# Patient Record
Sex: Male | Born: 1955
Health system: Southern US, Community
[De-identification: ages and names within clinical notes are randomized; demographics above are authoritative.]

## PROBLEM LIST (undated history)

## (undated) DIAGNOSIS — F419 Anxiety disorder, unspecified: Secondary | ICD-10-CM

## (undated) DIAGNOSIS — I10 Essential (primary) hypertension: Secondary | ICD-10-CM

## (undated) DIAGNOSIS — G4730 Sleep apnea, unspecified: Secondary | ICD-10-CM

## (undated) DIAGNOSIS — J40 Bronchitis, not specified as acute or chronic: Secondary | ICD-10-CM

## (undated) DIAGNOSIS — I619 Nontraumatic intracerebral hemorrhage, unspecified: Secondary | ICD-10-CM

## (undated) DIAGNOSIS — G459 Transient cerebral ischemic attack, unspecified: Secondary | ICD-10-CM

## (undated) DIAGNOSIS — M170 Bilateral primary osteoarthritis of knee: Secondary | ICD-10-CM

## (undated) DIAGNOSIS — F329 Major depressive disorder, single episode, unspecified: Secondary | ICD-10-CM

## (undated) DIAGNOSIS — R7303 Prediabetes: Secondary | ICD-10-CM

## (undated) DIAGNOSIS — E119 Type 2 diabetes mellitus without complications: Secondary | ICD-10-CM

## (undated) DIAGNOSIS — F32A Depression, unspecified: Secondary | ICD-10-CM

## (undated) DIAGNOSIS — Z87442 Personal history of urinary calculi: Secondary | ICD-10-CM

## (undated) HISTORY — PX: CHOLECYSTECTOMY: SHX55

## (undated) HISTORY — PX: OTHER SURGICAL HISTORY: SHX169

## (undated) HISTORY — PX: TONSILLECTOMY: SUR1361

## (undated) HISTORY — PX: BARIATRIC SURGERY: SHX1103

---

## 1992-10-02 HISTORY — PX: ACHILLES TENDON SURGERY: SHX542

## 2001-07-24 ENCOUNTER — Encounter: Payer: Self-pay | Admitting: Family Medicine

## 2001-07-24 ENCOUNTER — Ambulatory Visit (HOSPITAL_COMMUNITY): Admission: RE | Admit: 2001-07-24 | Discharge: 2001-07-24 | Payer: Self-pay | Admitting: Family Medicine

## 2001-08-05 ENCOUNTER — Ambulatory Visit (HOSPITAL_COMMUNITY): Admission: RE | Admit: 2001-08-05 | Discharge: 2001-08-05 | Payer: Self-pay | Admitting: General Surgery

## 2001-12-02 ENCOUNTER — Emergency Department (HOSPITAL_COMMUNITY): Admission: EM | Admit: 2001-12-02 | Discharge: 2001-12-02 | Payer: Self-pay | Admitting: Emergency Medicine

## 2003-09-02 ENCOUNTER — Emergency Department (HOSPITAL_COMMUNITY): Admission: EM | Admit: 2003-09-02 | Discharge: 2003-09-02 | Payer: Self-pay | Admitting: *Deleted

## 2004-02-05 ENCOUNTER — Emergency Department (HOSPITAL_COMMUNITY): Admission: EM | Admit: 2004-02-05 | Discharge: 2004-02-05 | Payer: Self-pay | Admitting: Family Medicine

## 2006-10-02 HISTORY — PX: OTHER SURGICAL HISTORY: SHX169

## 2009-10-24 ENCOUNTER — Observation Stay (HOSPITAL_COMMUNITY): Admission: EM | Admit: 2009-10-24 | Discharge: 2009-10-25 | Payer: Self-pay | Admitting: Emergency Medicine

## 2010-12-18 LAB — BASIC METABOLIC PANEL
BUN: 20 mg/dL (ref 6–23)
Chloride: 98 mEq/L (ref 96–112)
Creatinine, Ser: 1 mg/dL (ref 0.4–1.5)
Glucose, Bld: 119 mg/dL — ABNORMAL HIGH (ref 70–99)
Potassium: 3.2 mEq/L — ABNORMAL LOW (ref 3.5–5.1)

## 2010-12-18 LAB — CBC
HCT: 40.7 % (ref 39.0–52.0)
HCT: 46.8 % (ref 39.0–52.0)
MCV: 86.4 fL (ref 78.0–100.0)
Platelets: 232 10*3/uL (ref 150–400)
Platelets: 268 10*3/uL (ref 150–400)
RBC: 4.66 MIL/uL (ref 4.22–5.81)
RDW: 13.1 % (ref 11.5–15.5)
WBC: 13.1 10*3/uL — ABNORMAL HIGH (ref 4.0–10.5)
WBC: 22 10*3/uL — ABNORMAL HIGH (ref 4.0–10.5)

## 2010-12-18 LAB — DIFFERENTIAL
Basophils Absolute: 0 10*3/uL (ref 0.0–0.1)
Basophils Absolute: 0 10*3/uL (ref 0.0–0.1)
Basophils Relative: 0 % (ref 0–1)
Eosinophils Absolute: 0 10*3/uL (ref 0.0–0.7)
Eosinophils Relative: 0 % (ref 0–5)
Lymphs Abs: 0.4 10*3/uL — ABNORMAL LOW (ref 0.7–4.0)
Monocytes Absolute: 0.8 10*3/uL (ref 0.1–1.0)
Monocytes Absolute: 0.9 10*3/uL (ref 0.1–1.0)
Neutro Abs: 11 10*3/uL — ABNORMAL HIGH (ref 1.7–7.7)
Neutrophils Relative %: 94 % — ABNORMAL HIGH (ref 43–77)

## 2010-12-18 LAB — COMPREHENSIVE METABOLIC PANEL
AST: 25 U/L (ref 0–37)
Albumin: 3 g/dL — ABNORMAL LOW (ref 3.5–5.2)
Alkaline Phosphatase: 63 U/L (ref 39–117)
BUN: 10 mg/dL (ref 6–23)
CO2: 21 mEq/L (ref 19–32)
Chloride: 106 mEq/L (ref 96–112)
GFR calc non Af Amer: >60 mL/min (ref >60)
Potassium: 3.8 mEq/L (ref 3.5–5.1)
Total Bilirubin: 1 mg/dL (ref 0.3–1.2)

## 2010-12-18 LAB — LACTIC ACID, PLASMA: Lactic Acid, Venous: 1.2 mmol/L (ref 0.5–2.2)

## 2010-12-18 LAB — CK: Total CK: 120 U/L (ref 7–232)

## 2010-12-18 LAB — URINE MICROSCOPIC-ADD ON

## 2010-12-18 LAB — HEPATIC FUNCTION PANEL
AST: 33 U/L (ref 0–37)
Albumin: 4 g/dL (ref 3.5–5.2)
Total Bilirubin: 1 mg/dL (ref 0.3–1.2)
Total Protein: 7.4 g/dL (ref 6.0–8.3)

## 2010-12-18 LAB — URINALYSIS, ROUTINE W REFLEX MICROSCOPIC
Bilirubin Urine: NEGATIVE
Glucose, UA: NEGATIVE mg/dL
Ketones, ur: NEGATIVE mg/dL
Leukocytes, UA: NEGATIVE
Nitrite: NEGATIVE
Protein, ur: NEGATIVE mg/dL
Specific Gravity, Urine: 1.015 (ref 1.005–1.030)
Urobilinogen, UA: 0.2 mg/dL (ref 0.0–1.0)
pH: 6 (ref 5.0–8.0)

## 2010-12-18 LAB — CULTURE, BLOOD (ROUTINE X 2): Report Status: 1282011

## 2010-12-18 LAB — LIPASE, BLOOD: Lipase: 25 U/L (ref 11–59)

## 2011-02-17 NOTE — H&P (Signed)
[facility]  Patient:    Seth [name], Seth [name] Visit Number: [id number] MRN: [id number]          Service Type: OUT Location: RAD Attending Physician:  Kirk Ruths Dictated by:   Franky Macho, M.D. Admit Date:  07/24/2001 Discharge Date: 07/24/2001   CC:         Karleen Hampshire, M.D.   History and Physical  AGE:  55 years old.  CHIEF COMPLAINT:  Cholecystitis, cholelithiasis.  HISTORY OF PRESENT ILLNESS:  The patient is a 55 year old white male who was referred for evaluation and treatment of cholecystitis secondary to cholelithiasis. He has been having episodes of right upper quadrant abdominal pain radiating to the back, nausea, bloating, fatty food intolerance, and indigestion for many months. The symptoms seem to be getting worse. No fever, chills, or jaundice had been noted. There is no history of peptic ulcer disease.  PAST MEDICAL HISTORY:  1. Morbid obesity.  2. Hypertension.  3. Sinus difficulties.  PAST SURGICAL HISTORY:  Right Achilles tendon repair.  CURRENT MEDICATIONS:  1. Ziac.  2. Altace.  3. Allegra.  ALLERGIES:  No known drug allergies.  REVIEW OF SYSTEMS:  The patient denies drinking or smoking. Denies any other cardiopulmonary difficulties or bleeding disorders.  PHYSICAL EXAMINATION:  GENERAL:  The patients a morbidly obese white male in no acute distress.  VITAL SIGNS:  He weighs approximately 375 pounds. He is afebrile and vital signs are stable.  HEENT:  Reveals no scleral icterus.  LUNGS:  Clear to auscultation with equal breath sounds bilaterally.  HEART:  Reveals a regular rate and rhythm without S3, S4, or murmurs.  ABDOMEN:  Soft, nontender, nondistended. No hepatosplenomegaly, masses, or hernias are noted. Slight tenderness noted in the right upper quadrant to deep palpation. Ultrasound of the gallbladder reveals cholelithiasis with a normal common bile duct.  IMPRESSION:  Cholecystitis,  cholelithiasis.  PLAN:  The patient is scheduled for a laparoscopic cholecystectomy on August 05, 2001. The risks and benefits of the procedure including bleeding, infection, hepatobiliary injury, and the possibility of an open procedure were fully explained to the patient, who gave informed consent. Dictated by:   Franky Macho, M.D. Attending Physician:  Kirk Ruths DD:  08/01/01 TD:  08/01/01 Job: [id number]

## 2011-02-17 NOTE — Op Note (Signed)
[facility]  Patient:    Seth [name], Seth [name] Visit Number: [id number] MRN: [id number]          Service Type: DSU Location: DAY Attending Physician:  Dalia Heading Dictated by:   Franky Macho, M.D. Proc. Date: 08/05/01 Admit Date:  08/05/2001                             Operative Report  PATIENT AGE:  55 years old.  PREOPERATIVE DIAGNOSIS:  Cholecystitis, cholelithiasis.  POSTOPERATIVE DIAGNOSIS:  Cholecystitis, cholelithiasis.  OPERATION:  Laparoscopic cholecystectomy.  SURGEON:  Franky Macho, M.D.  ASSISTANT:  Arna Snipe, M.D.  ANESTHESIA:  General endotracheal anesthesia.  INDICATIONS:  The patient is a 55 year old morbidly obese white male who presents with cholecystitis secondary to cholelithiasis.  The risks and benefits of the procedure including bleeding, infection, hepatobiliary injury, and the possibility of an open procedure were fully explained to the patient who gave informed consent.  DESCRIPTION OF PROCEDURE:  The patient was placed in the supine position. After induction of general endotracheal anesthesia, the abdomen was prepped and draped using the usual sterile technique with Betadine.  A supraumbilical incision was made down to the fascia.  A Veress needle was introduced into the abdominal cavity, and confirmation of placement was done using the saline drop test.  The abdomen was then insufflated with 16 mmHg pressure.  An 11 mm trocar was introduced into the abdominal cavity under direct visualization without difficulty.  An additional 11 mm trocar was placed in the epigastric region.  An additional 5 mm trocar was then placed in the left paramedian region, right upper quadrant, and right flank regions. The liver was inspected and noted to be fatty in nature.  The gallbladder was retracted superiorly and laterally.  The dissection was begun around the infundibulum of the gallbladder.  The cystic duct was first  identified, its juncture to the infundibulum fully identified.  Endoclips were placed proximally and distally on the cystic duct, and the cystic duct was divided. This was likewise done on the cystic artery.   The gallbladder was then freed away from the gallbladder fossa using Bovie electrocautery.  The gallbladder was delivered through the epigastric trocar site using an Endocatch bag without difficulty.  The gallbladder fossa was inspected, and no abnormal bleeding or bile leakage was noted.  Surgicel was placed in the gallbladder fossa.  Subhepatic space as well as right hepatic gutter were irrigated with normal saline.  All fluid and air were then evacuated from the abdominal cavity prior to removing the trocars.  All wounds were irrigated with normal saline.  All wounds were injected with 0.5% Marcaine.  The supraumbilical fascia reapproximated using an 0 Vicryl interrupted suture.  All skin incisions were closed using staples.  Betadine ointment and dry sterile dressings were applied.  All tape and needle counts were correct at the end of the procedure.  The patient was extubated in the operating room and went back to the recovery room awake and in stable condition.  COMPLICATIONS:  None.  SPECIMEN:  Gallbladder with stone.  ESTIMATED BLOOD LOSS:  Minimal. Dictated by:   Franky Macho, M.D. Attending Physician:  Dalia Heading DD:  08/05/01 TD:  08/06/01 Job: [id number]

## 2012-07-16 DIAGNOSIS — I619 Nontraumatic intracerebral hemorrhage, unspecified: Secondary | ICD-10-CM | POA: Insufficient documentation

## 2012-07-17 DIAGNOSIS — F419 Anxiety disorder, unspecified: Secondary | ICD-10-CM | POA: Insufficient documentation

## 2012-07-17 DIAGNOSIS — I10 Essential (primary) hypertension: Secondary | ICD-10-CM | POA: Insufficient documentation

## 2013-10-02 HISTORY — PX: COLONOSCOPY: SHX174

## 2014-12-17 DIAGNOSIS — J309 Allergic rhinitis, unspecified: Secondary | ICD-10-CM | POA: Insufficient documentation

## 2015-10-13 DIAGNOSIS — Z298 Encounter for other specified prophylactic measures: Secondary | ICD-10-CM | POA: Insufficient documentation

## 2015-10-13 DIAGNOSIS — Z2989 Encounter for other specified prophylactic measures: Secondary | ICD-10-CM | POA: Insufficient documentation

## 2015-12-08 DIAGNOSIS — F1120 Opioid dependence, uncomplicated: Secondary | ICD-10-CM | POA: Insufficient documentation

## 2016-03-23 DIAGNOSIS — F4322 Adjustment disorder with anxiety: Secondary | ICD-10-CM | POA: Insufficient documentation

## 2017-04-19 DIAGNOSIS — G459 Transient cerebral ischemic attack, unspecified: Secondary | ICD-10-CM | POA: Insufficient documentation

## 2017-04-19 DIAGNOSIS — M5431 Sciatica, right side: Secondary | ICD-10-CM | POA: Insufficient documentation

## 2017-08-07 ENCOUNTER — Other Ambulatory Visit (HOSPITAL_COMMUNITY): Payer: Self-pay | Admitting: Internal Medicine

## 2017-08-07 DIAGNOSIS — R109 Unspecified abdominal pain: Secondary | ICD-10-CM

## 2017-08-08 ENCOUNTER — Ambulatory Visit (HOSPITAL_COMMUNITY)
Admission: RE | Admit: 2017-08-08 | Discharge: 2017-08-08 | Disposition: A | Discharge status: Home / Self Care | Payer: BC Managed Care – PPO | Source: Ambulatory Visit | Attending: Internal Medicine | Admitting: Internal Medicine

## 2017-08-08 DIAGNOSIS — K760 Fatty (change of) liver, not elsewhere classified: Secondary | ICD-10-CM | POA: Diagnosis not present

## 2017-08-08 DIAGNOSIS — N200 Calculus of kidney: Secondary | ICD-10-CM | POA: Diagnosis not present

## 2017-08-08 DIAGNOSIS — R109 Unspecified abdominal pain: Secondary | ICD-10-CM | POA: Diagnosis present

## 2017-08-08 DIAGNOSIS — I700 Atherosclerosis of aorta: Secondary | ICD-10-CM | POA: Diagnosis not present

## 2017-08-13 ENCOUNTER — Ambulatory Visit (HOSPITAL_COMMUNITY): Payer: Self-pay

## 2017-12-14 ENCOUNTER — Ambulatory Visit (INDEPENDENT_AMBULATORY_CARE_PROVIDER_SITE_OTHER): Payer: Self-pay

## 2017-12-14 ENCOUNTER — Ambulatory Visit (INDEPENDENT_AMBULATORY_CARE_PROVIDER_SITE_OTHER): Payer: BC Managed Care – PPO | Admitting: Orthopedic Surgery

## 2017-12-14 ENCOUNTER — Encounter (INDEPENDENT_AMBULATORY_CARE_PROVIDER_SITE_OTHER): Payer: Self-pay | Admitting: Orthopedic Surgery

## 2017-12-14 DIAGNOSIS — M1712 Unilateral primary osteoarthritis, left knee: Secondary | ICD-10-CM | POA: Diagnosis not present

## 2017-12-14 DIAGNOSIS — M25561 Pain in right knee: Secondary | ICD-10-CM

## 2017-12-14 DIAGNOSIS — M1711 Unilateral primary osteoarthritis, right knee: Secondary | ICD-10-CM | POA: Diagnosis not present

## 2017-12-14 DIAGNOSIS — M25562 Pain in left knee: Secondary | ICD-10-CM | POA: Diagnosis not present

## 2017-12-14 DIAGNOSIS — M79672 Pain in left foot: Secondary | ICD-10-CM | POA: Diagnosis not present

## 2017-12-14 DIAGNOSIS — M79671 Pain in right foot: Secondary | ICD-10-CM | POA: Diagnosis not present

## 2017-12-14 DIAGNOSIS — M722 Plantar fascial fibromatosis: Secondary | ICD-10-CM | POA: Diagnosis not present

## 2017-12-15 ENCOUNTER — Encounter (INDEPENDENT_AMBULATORY_CARE_PROVIDER_SITE_OTHER): Payer: Self-pay | Admitting: Orthopedic Surgery

## 2017-12-15 MED ORDER — METHYLPREDNISOLONE ACETATE 40 MG/ML IJ SUSP
13.3300 mg | INTRAMUSCULAR | Status: AC | PRN
  Administered 2017-12-15: 13.33 mg

## 2017-12-15 MED ORDER — BUPIVACAINE HCL 0.25 % IJ SOLN
4.0000 mL | INTRAMUSCULAR | Status: AC | PRN
  Administered 2017-12-15: 4 mL via INTRA_ARTICULAR

## 2017-12-15 MED ORDER — LIDOCAINE HCL 1 % IJ SOLN
5.0000 mL | INTRAMUSCULAR | Status: AC | PRN
Start: 2017-12-15 — End: 2017-12-15
  Administered 2017-12-15: 5 mL

## 2017-12-15 MED ORDER — BUPIVACAINE HCL 0.5 % IJ SOLN
1.0000 mL | INTRAMUSCULAR | Status: AC | PRN
  Administered 2017-12-15: 1 mL

## 2017-12-15 MED ORDER — LIDOCAINE HCL 1 % IJ SOLN
3.0000 mL | INTRAMUSCULAR | Status: AC | PRN
  Administered 2017-12-15: 3 mL

## 2017-12-15 MED ORDER — METHYLPREDNISOLONE ACETATE 40 MG/ML IJ SUSP
40.0000 mg | INTRAMUSCULAR | Status: AC | PRN
  Administered 2017-12-15: 40 mg via INTRA_ARTICULAR

## 2017-12-15 NOTE — Progress Notes (Signed)
Office Visit Note   Patient: Seth [name]           Date of Birth: 1956-03-28           MRN: [id number] Visit Date: 12/14/2017 Requested by: No referring provider defined for this encounter. PCP: Patient, No Pcp Per  Subjective: Chief Complaint  Patient presents with  . Left Knee - Pain  . Right Knee - Pain  . Left Foot - Pain    HPI: Seth [name] is a 62 year old patient with bilateral knee pain left worse than right.  Ambulates with a cane.  He is requesting an injection today.  He also reports left heel pain.  Reports increased pain after sitting and being at rest.  Requesting an injection today.  He has tried ice and stretching.  His knee pain is somewhat debilitating.  Has had previous treatment over 5 years ago for the knee pain.              ROS: All systems reviewed are negative as they relate to the chief complaint within the history of present illness.  Patient denies  fevers or chills.   Assessment & Plan: Visit Diagnoses:  1. Pain in both knees, unspecified chronicity   2. Pain of left heel   3. Unilateral primary osteoarthritis, left knee   4. Unilateral primary osteoarthritis, right knee   5. Plantar fasciitis of left foot     Plan: Impression is bilateral knee pain left worse than right.  Radiographically end stage arthritis is present.  He also has significant plantar fasciitis.  He has failed conservative measures.  Left knee and left heel injected today.  If that helps and we can come back and inject the right knee.  At some point in time he is going to need knee replacement.  I will see him back as needed  Follow-Up Instructions: Return if symptoms worsen or fail to improve.   Orders:  Orders Placed This Encounter  Procedures  . XR KNEE 3 VIEW LEFT  . XR KNEE 3 VIEW RIGHT  . XR Foot 2 Views Left   No orders of the defined types were placed in this encounter.     Procedures: Large Joint Inj: L knee on 12/15/2017 5:31 PM Indications: diagnostic  evaluation, joint swelling and pain Details: 18 G 1.5 in needle, superolateral approach  Arthrogram: No  Medications: 5 mL lidocaine 1 %; 40 mg methylPREDNISolone acetate 40 MG/ML; 4 mL bupivacaine 0.25 % Outcome: tolerated well, no immediate complications Procedure, treatment alternatives, risks and benefits explained, specific risks discussed. Consent was given by the patient. Immediately prior to procedure a time out was called to verify the correct patient, procedure, equipment, support staff and site/side marked as required. Patient was prepped and draped in the usual sterile fashion.   Foot Inj Date/Time: 12/15/2017 5:31 PM Performed by: Cammy Copaean, Gregory Scott, MD Authorized by: Cammy Copaean, Gregory Scott, MD   Consent Given by:  Patient Site marked: the procedure site was marked   Timeout: prior to procedure the correct patient, procedure, and site was verified   Indications:  Fasciitis and pain Condition: Plantar Fasciitis   Location: left plantar fascia muscle   Prep: patient was prepped and draped in usual sterile fashion   Needle Size:  22 G Medications:  3 mL lidocaine 1 %; 1 mL bupivacaine 0.5 %; 13.33 mg methylPREDNISolone acetate 40 MG/ML Patient Tolerance:  Patient tolerated the procedure well with no immediate complications   Ultrasound-guided on the plantar  fascia  Clinical Data: No additional findings.  Objective: Vital Signs: There were no vitals taken for this visit.  Physical Exam:   Constitutional: Patient appears well-developed HEENT:  Head: Normocephalic Eyes:EOM are normal Neck: Normal range of motion Cardiovascular: Normal rate Pulmonary/chest: Effort normal Neurologic: Patient is alert Skin: Skin is warm Psychiatric: Patient has normal mood and affect    Ortho Exam: Orthopedic exam demonstrates palpable pedal pulses.  Patient has good plantar flexion strength bilaterally.  Achilles tendon is palpable and intact on the left-hand side.  Pedal pulses  palpable.  Patient has symmetric tibiotalar subtalar transverse tarsal range of motion with only slight heel cord contracture on the left and right side.  Both knees have medial greater than lateral joint line tenderness with trace effusion.  Collateral and cruciate ligaments are stable.  No other masses lymph adenopathy or skin changes noted in bilateral knee region.  Specialty Comments:  No specialty comments available.  Imaging: No results found.   PMFS History: There are no active problems to display for this patient.  History reviewed. No pertinent past medical history.  History reviewed. No pertinent family history.  History reviewed. No pertinent surgical history. Social History   Occupational History  . Not on file  Tobacco Use  . Smoking status: Not on file  Substance and Sexual Activity  . Alcohol use: Not on file  . Drug use: Not on file  . Sexual activity: Not on file

## 2018-01-10 ENCOUNTER — Ambulatory Visit (INDEPENDENT_AMBULATORY_CARE_PROVIDER_SITE_OTHER): Payer: BC Managed Care – PPO | Admitting: Orthopedic Surgery

## 2018-01-10 ENCOUNTER — Encounter (INDEPENDENT_AMBULATORY_CARE_PROVIDER_SITE_OTHER): Payer: Self-pay | Admitting: Orthopedic Surgery

## 2018-01-10 DIAGNOSIS — M1712 Unilateral primary osteoarthritis, left knee: Secondary | ICD-10-CM

## 2018-01-10 DIAGNOSIS — M1711 Unilateral primary osteoarthritis, right knee: Secondary | ICD-10-CM | POA: Diagnosis not present

## 2018-01-13 ENCOUNTER — Encounter (INDEPENDENT_AMBULATORY_CARE_PROVIDER_SITE_OTHER): Payer: Self-pay | Admitting: Orthopedic Surgery

## 2018-01-13 NOTE — Progress Notes (Signed)
   Office Visit Note   Patient: Seth [name]           Date of Birth: 1956/10/01           MRN: [id number] Visit Date: 01/10/2018 Requested by: No referring provider defined for this encounter. PCP: Patient, No Pcp Per  Subjective: Chief Complaint  Patient presents with  . Left Knee - Pain  . Right Knee - Pain    HPI: Seth [name] is a patient with bilateral knee pain.  Had an injection in his heel and knee 12/14/2017.  The plantar fascia injection is doing well.  He has end-stage arthritis in both knees.  He has a children's Gala on May 3.  Difficult for him to exercise and walk.  He does have a thyroid condition.  He localizes pain to the entire knee.  Hard for him to step down when going downstairs.              ROS: All systems reviewed are negative as they relate to the chief complaint within the history of present illness.  Patient denies  fevers or chills.   Assessment & Plan: Visit Diagnoses:  1. Unilateral primary osteoarthritis, left knee   2. Unilateral primary osteoarthritis, right knee     Plan: Impression is bilateral knee arthritis.  Total knee replacement is likely in his future.  He will be close to the BMI recommended guidelines.  We will try Synvisc injections within the next 2 weeks to try to help him get through the children's Gala.  I will see him back at that time.  Follow-Up Instructions: Return in about 2 weeks (around 01/24/2018).   Orders:  No orders of the defined types were placed in this encounter.  No orders of the defined types were placed in this encounter.     Procedures: No procedures performed   Clinical Data: No additional findings.  Objective: Vital Signs: There were no vitals taken for this visit.  Physical Exam:   Constitutional: Patient appears well-developed HEENT:  Head: Normocephalic Eyes:EOM are normal Neck: Normal range of motion Cardiovascular: Normal rate Pulmonary/chest: Effort normal Neurologic: Patient is  alert Skin: Skin is warm Psychiatric: Patient has normal mood and affect    Ortho Exam: Orthopedic exam demonstrates full active and passive range of motion of the ankles.  In the knees there is no effusion but tenderness to palpation diffusely in both knees.  Collateral cruciate ligaments are stable.  No groin pain with internal/external rotation of the leg.  No other masses lymph adenopathy or skin changes noted in that knee region.  Specialty Comments:  No specialty comments available.  Imaging: No results found.   PMFS History: There are no active problems to display for this patient.  History reviewed. No pertinent past medical history.  History reviewed. No pertinent family history.  History reviewed. No pertinent surgical history. Social History   Occupational History  . Not on file  Tobacco Use  . Smoking status: Unknown If Ever Smoked  . Smokeless tobacco: Never Used  Substance and Sexual Activity  . Alcohol use: Not on file  . Drug use: Not on file  . Sexual activity: Not on file

## 2018-01-14 ENCOUNTER — Telehealth (INDEPENDENT_AMBULATORY_CARE_PROVIDER_SITE_OTHER): Payer: Self-pay

## 2018-01-14 NOTE — Telephone Encounter (Signed)
Submitted application online for SynviscOne injection, bilateral knee. 

## 2018-01-22 ENCOUNTER — Telehealth (INDEPENDENT_AMBULATORY_CARE_PROVIDER_SITE_OTHER): Payer: Self-pay

## 2018-01-22 NOTE — Telephone Encounter (Signed)
[facility] [phone].

## 2018-01-23 ENCOUNTER — Telehealth (INDEPENDENT_AMBULATORY_CARE_PROVIDER_SITE_OTHER): Payer: Self-pay

## 2018-01-23 ENCOUNTER — Ambulatory Visit (INDEPENDENT_AMBULATORY_CARE_PROVIDER_SITE_OTHER): Payer: BC Managed Care – PPO | Admitting: Orthopedic Surgery

## 2018-01-23 NOTE — Telephone Encounter (Signed)
Patient approved to have SynviscOne injection, bilateral knee. Reference# [id number] Valid 01/22/18-01/22/2019  Buy & Bill Covered at 100% after $80.00 co-pay Appt. 01/31/18

## 2018-01-23 NOTE — Telephone Encounter (Signed)
Talked with [employer], authorization still pending for SynviscOne, bilateral knee.   Reference# [id number]

## 2018-01-31 ENCOUNTER — Ambulatory Visit (INDEPENDENT_AMBULATORY_CARE_PROVIDER_SITE_OTHER): Payer: BC Managed Care – PPO | Admitting: Orthopedic Surgery

## 2018-01-31 ENCOUNTER — Encounter (INDEPENDENT_AMBULATORY_CARE_PROVIDER_SITE_OTHER): Payer: Self-pay | Admitting: Orthopedic Surgery

## 2018-01-31 DIAGNOSIS — M1711 Unilateral primary osteoarthritis, right knee: Secondary | ICD-10-CM

## 2018-01-31 DIAGNOSIS — M1712 Unilateral primary osteoarthritis, left knee: Secondary | ICD-10-CM

## 2018-01-31 MED ORDER — PREDNISONE 5 MG (21) PO TBPK: ORAL_TABLET | ORAL | 0 refills | Status: DC

## 2018-01-31 NOTE — Progress Notes (Signed)
t

## 2018-02-01 ENCOUNTER — Telehealth (INDEPENDENT_AMBULATORY_CARE_PROVIDER_SITE_OTHER): Payer: Self-pay | Admitting: Orthopedic Surgery

## 2018-02-01 MED ORDER — PREDNISONE 5 MG (21) PO TBPK: ORAL_TABLET | ORAL | 0 refills | Status: DC

## 2018-02-01 NOTE — Telephone Encounter (Signed)
[facility] [phone]   Pharmacy phone line was down yesterday didn't receive prescription   Prednisone

## 2018-02-01 NOTE — Telephone Encounter (Signed)
I have tried to call the patient earlier this morning to let him know that I had resubmitted his medication (see other message from earlier today) He did not answer.  I tried calling him again, once receiving this VM and he did not answer again just now when I tried calling him back. I left him very detailed message apologizing for whatever we had done to make him upset and that we had sent his rx in several times. LM for him to call me back to discuss.

## 2018-02-01 NOTE — Telephone Encounter (Signed)
Resubmitted to pharmacy. I tried calling patient to advise. No answer.

## 2018-02-01 NOTE — Telephone Encounter (Signed)
Patient called very upset that he did not get his Rx last night. Patient said he want his Rx in the future in his hand and not sent to any pharmacy. Patient said if that can not be done he will go to another doctor. Patient said he has a big function to go to tonight and he needed his medicine.  The number to contact patient is [phone]

## 2018-02-02 ENCOUNTER — Encounter (INDEPENDENT_AMBULATORY_CARE_PROVIDER_SITE_OTHER): Payer: Self-pay | Admitting: Orthopedic Surgery

## 2018-02-02 DIAGNOSIS — M1711 Unilateral primary osteoarthritis, right knee: Secondary | ICD-10-CM | POA: Diagnosis not present

## 2018-02-02 DIAGNOSIS — M1712 Unilateral primary osteoarthritis, left knee: Secondary | ICD-10-CM

## 2018-02-02 MED ORDER — HYLAN G-F 20 48 MG/6ML IX SOSY
48.0000 mg | PREFILLED_SYRINGE | INTRA_ARTICULAR | Status: AC | PRN
  Administered 2018-02-02: 48 mg via INTRA_ARTICULAR

## 2018-02-02 MED ORDER — LIDOCAINE HCL 1 % IJ SOLN
5.0000 mL | INTRAMUSCULAR | Status: AC | PRN
  Administered 2018-02-02: 5 mL

## 2018-02-02 NOTE — Progress Notes (Signed)
   Procedure Note  Patient: Seth [name]             Date of Birth: 10/28/1955           MRN: [id number]             Visit Date: 01/31/2018  Procedures: Visit Diagnoses: Unilateral primary osteoarthritis, left knee  Unilateral primary osteoarthritis, right knee  Large Joint Inj: bilateral knee on 02/02/2018 7:05 PM Indications: diagnostic evaluation, joint swelling and pain Details: 18 G 1.5 in needle, superolateral approach  Arthrogram: No  Medications (Right): 5 mL lidocaine 1 %; 48 mg Hylan 48 MG/6ML Medications (Left): 5 mL lidocaine 1 %; 48 mg Hylan 48 MG/6ML Outcome: tolerated well, no immediate complications Procedure, treatment alternatives, risks and benefits explained, specific risks discussed. Consent was given by the patient. Immediately prior to procedure a time out was called to verify the correct patient, procedure, equipment, support staff and site/side marked as required. Patient was prepped and draped in the usual sterile fashion.

## 2018-05-30 ENCOUNTER — Other Ambulatory Visit (HOSPITAL_COMMUNITY): Payer: Self-pay | Admitting: Nurse Practitioner

## 2018-05-30 ENCOUNTER — Ambulatory Visit (HOSPITAL_COMMUNITY)
Admission: RE | Admit: 2018-05-30 | Discharge: 2018-05-30 | Disposition: A | Discharge status: Home / Self Care | Payer: BC Managed Care – PPO | Source: Ambulatory Visit | Attending: Nurse Practitioner | Admitting: Nurse Practitioner

## 2018-05-30 DIAGNOSIS — M5440 Lumbago with sciatica, unspecified side: Secondary | ICD-10-CM | POA: Diagnosis not present

## 2018-05-30 DIAGNOSIS — N200 Calculus of kidney: Secondary | ICD-10-CM | POA: Diagnosis not present

## 2018-05-30 DIAGNOSIS — R1030 Lower abdominal pain, unspecified: Secondary | ICD-10-CM

## 2018-06-04 ENCOUNTER — Other Ambulatory Visit: Payer: Self-pay | Admitting: Urology

## 2018-06-11 ENCOUNTER — Encounter (HOSPITAL_COMMUNITY)
Admission: RE | Admit: 2018-06-11 | Discharge: 2018-06-11 | Disposition: A | Discharge status: Home / Self Care | Payer: BC Managed Care – PPO | Source: Ambulatory Visit | Attending: Urology | Admitting: Urology

## 2018-06-11 ENCOUNTER — Encounter (HOSPITAL_COMMUNITY): Payer: Self-pay | Admitting: *Deleted

## 2018-06-11 DIAGNOSIS — I440 Atrioventricular block, first degree: Secondary | ICD-10-CM | POA: Insufficient documentation

## 2018-06-11 DIAGNOSIS — Z01810 Encounter for preprocedural cardiovascular examination: Secondary | ICD-10-CM | POA: Diagnosis not present

## 2018-06-11 DIAGNOSIS — N201 Calculus of ureter: Secondary | ICD-10-CM | POA: Diagnosis not present

## 2018-06-11 DIAGNOSIS — I4510 Unspecified right bundle-branch block: Secondary | ICD-10-CM | POA: Insufficient documentation

## 2018-06-11 NOTE — Progress Notes (Signed)
Pt states plans to arrive today or tomorrow am in order to have EKG preformed for lithotripsy procedure scheduled for Thursday 06/13/2018

## 2018-06-12 NOTE — H&P (Signed)
CC: I have kidney stones.  HPI: Seth [name] is a 62 year-old male patient who was referred by Seth [name] who is here for renal calculi.  The problem is on the left side.   Seth [name] is a 62 yo WM who is sent in consultation by Dr. Wende [name] for a left UPJ stone found on a KUB on 8/29 done for left upper quadrant and back pain. The stone is measured 11.6m on one view and 134mon another view. His pain is about a 3 now. His pain began 3 weeks ago and has been intermittently severe without nausea. He had no gross hematuria. He has some frequency and urgency with increased intake. He has had 2-3 prior stones but has passed them all. The current stone was in a calyx on a CT in 5/18 and was about 34m68mHe has a history of bariatric surgery in 4/05 with [facility] 90[facility] down from his presurgical weight. He is under pain management for his knees with oxycodone 10/325 tid but he has upped it to 4 daily. He passed a small stone about 3 weeks ago.      CC: AUA Questions Scoring.  HPI:     AUA Symptom Score: More than 50% of the time he has the sensation of not emptying his bladder completely when finished urinating. More than 50% of the time he has to urinate again fewer than two hours after he has finished urinating. Less than 50% of the time he has to start and stop again several times when he urinates. More than 50% of the time he finds it difficult to postpone urination. More than 50% of the time he has a weak urinary stream. Less than 50% of the time he has to push or strain to begin urination. He has to get up to urinate 2 times from the time he goes to bed until the time he gets up in the morning.   Calculated AUA Symptom Score: 22    ALLERGIES: Latex sulfa    MEDICATIONS: Metoprolol Succinate  Amlodipine Besylate  Aspirin Ec 81 mg tablet, delayed release  Citalopram Hbr  Keppra  Melatonin 10 mg capsule  Metaxalone  Montelukast Sodium  Xanax      GU PSH: None   NON-GU PSH: Bariatric surgery Cholecystectomy (laparoscopic) Hand/finger Surgery    GU PMH: None   NON-GU PMH: Anxiety Arthritis Depression Diabetes Type 2 GERD Hypertension Sleep Apnea Stroke/TIA, Hemorrhagic    FAMILY HISTORY: Emphysema - Mother Lung Cancer - Father   SOCIAL HISTORY: Marital Status: Single Preferred Language: English; Race: White Current Smoking Status: Patient has never smoked.   Tobacco Use Assessment Completed: Used Tobacco in last 30 days? Drinks 1 drink per day. Types of alcohol consumed: Liquor.  Drinks 2 caffeinated drinks per day. Patient's occupation is/was Retired from [employer]  REVIEW OF SYSTEMS:    GU Review Male:   Patient reports frequent urination, get up at night to urinate, and leakage of urine. Patient denies hard to postpone urination, burning/ pain with urination, stream starts and stops, trouble starting your stream, have to strain to urinate , erection problems, and penile pain.  Gastrointestinal (Upper):   Patient reports indigestion/ heartburn. Patient denies nausea and vomiting.  Gastrointestinal (Lower):   Patient denies diarrhea and constipation.  Constitutional:   Patient reports fatigue. Patient denies fever, night sweats, and weight loss.  Skin:   Patient  denies skin rash/ lesion and itching.  Eyes:   Patient denies blurred vision and double vision.  Ears/ Nose/ Throat:   Patient denies sore throat and sinus problems.  Hematologic/Lymphatic:   Patient denies swollen glands and easy bruising.  Cardiovascular:   Patient reports leg swelling. Patient denies chest pains.  Respiratory:   Patient denies cough and shortness of breath.  Endocrine:   Patient denies excessive thirst.  Musculoskeletal:   needs bilateral knee replacement. Patient reports back pain and joint pain.   Neurological:   Patient denies headaches and dizziness.  Psychologic:   Patient denies depression and anxiety.    VITAL SIGNS:      06/04/2018 10:51 AM  Weight 323 lb / 146.51 kg  Height 71 in / 180.34 cm  BP 101/66 mmHg  Pulse 69 /min  Temperature 97.5 F / 36.3 C  BMI 45.0 kg/m   MULTI-SYSTEM PHYSICAL EXAMINATION:    Constitutional: Obese. No physical deformities. Normally developed. Good grooming.   Neck: Neck symmetrical, not swollen. Normal tracheal position.  Respiratory: No labored breathing, no use of accessory muscles. CTA  Cardiovascular: Normal temperature, RRR without murmur. He has bilateral ankle edema.   Lymphatic: No enlargement, no tenderness of supraclavicular, neck lymph nodes.  Skin: No paleness, no jaundice, no cyanosis. No lesion, no ulcer, no rash.  Neurologic / Psychiatric: Oriented to time, oriented to place, oriented to person. No depression, no anxiety, no agitation.  Gastrointestinal: Abdominal tenderness in LUQ. No mass, no rigidity, obese abdomen.   Musculoskeletal: Normal gait and station of head and neck.     PAST DATA REVIEWED:  Source Of History:  Patient  Urine Test Review:   Urinalysis  X-Ray Review: KUB: Reviewed Films. Reviewed Report. Discussed With Patient. 62m LUPJ stone. C.T. Stone Protocol: Reviewed Films. Reviewed Report. Discussed With Patient. 5/18    PROCEDURES:          Urinalysis Dipstick Dipstick Cont'd  Color: Yellow Bilirubin: Neg mg/dL  Appearance: Clear Ketones: Neg mg/dL  Specific Gravity: 1.025 Blood: Neg ery/uL  pH: 5.5 Protein: Trace mg/dL  Glucose: Neg mg/dL Urobilinogen: 0.2 mg/dL    Nitrites: Neg    Leukocyte Esterase: Neg leu/uL    ASSESSMENT:      ICD-10 Details  1 GU:   Renal calculus - N20.0 He has an 11.5-14mm LUPJ stone with pain. I discussed the options including URS and ESWL. He would like to get scheduled for ESWL. Unfortunately he had ASA last night which will preclude treatment this week. I will get him ASAP next week but if his pain progresses he will need a stent. I reviewed the risks of ESWL including  bleeding, infection, injury to the kidney or adjacent structures, failure to fragment the stone, need for ancillary procedures, thrombotic events, cardiac arrhythmias and sedation complications. He is on pain management with oxycodone and I will defer to his pain doctor for additional break through meds. He will need MAC for the ESWL because of the chronic narcotics. I reviewed the risks of ESWL including bleeding, infection, injury to the kidney or adjacent structures, inability to target the stone because of body habitus or poor visibility, failure to fragment the stone, need for ancillary procedures, thrombotic events, cardiac arrhythmias and sedation complications.    PLAN:           Schedule Return Visit/Planned Activity: ASAP - Schedule Surgery             Note: ESWL on 9/9 if at all possible.  Procedure: Approximately 1 Week - ESWL - 50590, left          Document Letter(s):  Created for Patient: Clinical Summary         Notes:   CC: Dr. Wende [name].        Next Appointment:      Next Appointment: 06/13/2018 12:15 PM    Appointment Type: Surgery     Location: [facility] [phone]    Provider: Irine Seal, M.D.    Reason for Visit: WL/OP LEFT ESWL

## 2018-06-13 ENCOUNTER — Encounter (HOSPITAL_COMMUNITY)
Admission: RE | Disposition: A | Discharge status: Home / Self Care | Payer: Self-pay | Source: Ambulatory Visit | Attending: Urology

## 2018-06-13 ENCOUNTER — Ambulatory Visit (HOSPITAL_COMMUNITY): Payer: BC Managed Care – PPO

## 2018-06-13 ENCOUNTER — Ambulatory Visit (HOSPITAL_COMMUNITY)
Admission: RE | Admit: 2018-06-13 | Discharge: 2018-06-13 | Disposition: A | Discharge status: Home / Self Care | Payer: BC Managed Care – PPO | Source: Ambulatory Visit | Attending: Urology | Admitting: Urology

## 2018-06-13 ENCOUNTER — Encounter (HOSPITAL_COMMUNITY): Payer: Self-pay | Admitting: General Practice

## 2018-06-13 ENCOUNTER — Ambulatory Visit (HOSPITAL_COMMUNITY): Payer: BC Managed Care – PPO | Admitting: Anesthesiology

## 2018-06-13 DIAGNOSIS — Z6841 Body Mass Index (BMI) 40.0 and over, adult: Secondary | ICD-10-CM | POA: Diagnosis not present

## 2018-06-13 DIAGNOSIS — Z8673 Personal history of transient ischemic attack (TIA), and cerebral infarction without residual deficits: Secondary | ICD-10-CM | POA: Insufficient documentation

## 2018-06-13 DIAGNOSIS — I10 Essential (primary) hypertension: Secondary | ICD-10-CM | POA: Insufficient documentation

## 2018-06-13 DIAGNOSIS — E119 Type 2 diabetes mellitus without complications: Secondary | ICD-10-CM | POA: Insufficient documentation

## 2018-06-13 DIAGNOSIS — F329 Major depressive disorder, single episode, unspecified: Secondary | ICD-10-CM | POA: Insufficient documentation

## 2018-06-13 DIAGNOSIS — N201 Calculus of ureter: Secondary | ICD-10-CM | POA: Insufficient documentation

## 2018-06-13 DIAGNOSIS — Z79899 Other long term (current) drug therapy: Secondary | ICD-10-CM | POA: Insufficient documentation

## 2018-06-13 DIAGNOSIS — Z91040 Latex allergy status: Secondary | ICD-10-CM | POA: Insufficient documentation

## 2018-06-13 DIAGNOSIS — N135 Crossing vessel and stricture of ureter without hydronephrosis: Secondary | ICD-10-CM

## 2018-06-13 DIAGNOSIS — Z882 Allergy status to sulfonamides status: Secondary | ICD-10-CM | POA: Insufficient documentation

## 2018-06-13 DIAGNOSIS — Z7982 Long term (current) use of aspirin: Secondary | ICD-10-CM | POA: Diagnosis not present

## 2018-06-13 DIAGNOSIS — Z9884 Bariatric surgery status: Secondary | ICD-10-CM | POA: Diagnosis not present

## 2018-06-13 DIAGNOSIS — G4730 Sleep apnea, unspecified: Secondary | ICD-10-CM | POA: Diagnosis not present

## 2018-06-13 DIAGNOSIS — Z79891 Long term (current) use of opiate analgesic: Secondary | ICD-10-CM | POA: Diagnosis not present

## 2018-06-13 HISTORY — DX: Major depressive disorder, single episode, unspecified: F32.9

## 2018-06-13 HISTORY — DX: Depression, unspecified: F32.A

## 2018-06-13 HISTORY — DX: Transient cerebral ischemic attack, unspecified: G45.9

## 2018-06-13 HISTORY — PX: EXTRACORPOREAL SHOCK WAVE LITHOTRIPSY: SHX1557

## 2018-06-13 HISTORY — DX: Bilateral primary osteoarthritis of knee: M17.0

## 2018-06-13 HISTORY — DX: Type 2 diabetes mellitus without complications: E11.9

## 2018-06-13 HISTORY — DX: Essential (primary) hypertension: I10

## 2018-06-13 HISTORY — DX: Personal history of urinary calculi: Z87.442

## 2018-06-13 HISTORY — DX: Anxiety disorder, unspecified: F41.9

## 2018-06-13 HISTORY — DX: Sleep apnea, unspecified: G47.30

## 2018-06-13 SURGERY — LITHOTRIPSY, ESWL
Anesthesia: Monitor Anesthesia Care | Laterality: Left

## 2018-06-13 MED ORDER — SODIUM CHLORIDE 0.9 % IV SOLN
INTRAVENOUS | Status: DC
  Administered 2018-06-13: 11:00:00 via INTRAVENOUS

## 2018-06-13 MED ORDER — SILODOSIN 8 MG PO CAPS: 8.0000 mg | ORAL_CAPSULE | Freq: Every day | ORAL | 1 refills | Status: DC

## 2018-06-13 MED ORDER — MIDAZOLAM HCL 5 MG/5ML IJ SOLN
INTRAMUSCULAR | Status: DC | PRN
  Administered 2018-06-13: 2 mg via INTRAVENOUS

## 2018-06-13 MED ORDER — CIPROFLOXACIN HCL 500 MG PO TABS
500.0000 mg | ORAL_TABLET | ORAL | Status: AC
  Administered 2018-06-13: 500 mg via ORAL
  Filled 2018-06-13: qty 1

## 2018-06-13 MED ORDER — DIAZEPAM 5 MG PO TABS: 10.0000 mg | ORAL_TABLET | ORAL | Status: DC

## 2018-06-13 MED ORDER — PROPOFOL 10 MG/ML IV BOLUS
INTRAVENOUS | Status: DC | PRN
  Administered 2018-06-13: 450 mg via INTRAVENOUS

## 2018-06-13 MED ORDER — MIDAZOLAM HCL 2 MG/2ML IJ SOLN
INTRAMUSCULAR | Status: AC
  Filled 2018-06-13: qty 2

## 2018-06-13 MED ORDER — FENTANYL CITRATE (PF) 100 MCG/2ML IJ SOLN
INTRAMUSCULAR | Status: AC
  Filled 2018-06-13: qty 4

## 2018-06-13 MED ORDER — FENTANYL CITRATE (PF) 100 MCG/2ML IJ SOLN
INTRAMUSCULAR | Status: DC | PRN
  Administered 2018-06-13: 100 ug via INTRAVENOUS

## 2018-06-13 MED ORDER — PROPOFOL 10 MG/ML IV BOLUS
INTRAVENOUS | Status: AC
  Filled 2018-06-13: qty 100

## 2018-06-13 MED ORDER — DIPHENHYDRAMINE HCL 25 MG PO CAPS: 25.0000 mg | ORAL_CAPSULE | ORAL | Status: DC

## 2018-06-13 NOTE — Interval H&P Note (Signed)
History and Physical Interval Note:  No change in stone location  06/13/2018 12:25 PM  Seth [name]  has presented today for surgery, with the diagnosis of LEFT URETEROPELVIC JUNCTION STONE  The various methods of treatment have been discussed with the patient and family. After consideration of risks, benefits and other options for treatment, the patient has consented to  Procedure(s): LEFT EXTRACORPOREAL SHOCK WAVE LITHOTRIPSY (ESWL) WITH MAC (Left) as a surgical intervention .  The patient's history has been reviewed, patient examined, no change in status, stable for surgery.  I have reviewed the patient's chart and labs.  Questions were answered to the patient's satisfaction.     Irine Seal

## 2018-06-13 NOTE — Op Note (Signed)
See scanned PSC note.  Staged procedure.

## 2018-06-13 NOTE — Anesthesia Preprocedure Evaluation (Signed)
Anesthesia Evaluation  Patient identified by MRN, date of birth, ID band Patient awake    Reviewed: Allergy & Precautions, NPO status , Patient's Chart, lab work & pertinent test results  Airway Mallampati: II  TM Distance: <3 FB Neck ROM: Full    Dental no notable dental hx.    Pulmonary neg pulmonary ROS,    Pulmonary exam normal breath sounds clear to auscultation       Cardiovascular hypertension, Normal cardiovascular exam Rhythm:Regular Rate:Normal     Neuro/Psych Chronic oxycodone for back pain negative psych ROS   GI/Hepatic negative GI ROS, Neg liver ROS,   Endo/Other  Morbid obesity  Renal/GU negative Renal ROS  negative genitourinary   Musculoskeletal negative musculoskeletal ROS (+)   Abdominal (+) + obese,   Peds negative pediatric ROS (+)  Hematology negative hematology ROS (+)   Anesthesia Other Findings   Reproductive/Obstetrics negative OB ROS                             Anesthesia Physical Anesthesia Plan  ASA: III  Anesthesia Plan: MAC   Post-op Pain Management:    Induction: Intravenous  PONV Risk Score and Plan: 0  Airway Management Planned: Simple Face Mask  Additional Equipment:   Intra-op Plan:   Post-operative Plan:   Informed Consent: I have reviewed the patients History and Physical, chart, labs and discussed the procedure including the risks, benefits and alternatives for the proposed anesthesia with the patient or authorized representative who has indicated his/her understanding and acceptance.   Dental advisory given  Plan Discussed with: CRNA and Surgeon  Anesthesia Plan Comments:         Anesthesia Quick Evaluation

## 2018-06-13 NOTE — Discharge Instructions (Addendum)
Lithotripsy, Care After °This sheet gives you information about how to care for yourself after your procedure. Your health care provider may also give you more specific instructions. If you have problems or questions, contact your health care provider. °What can I expect after the procedure? °After the procedure, it is common to have: °· Some blood in your urine. This should only last for a few days. °· Soreness in your back, sides, or upper abdomen for a few days. °· Blotches or bruises on your back where the pressure wave entered the skin. °· Pain, discomfort, or nausea when pieces (fragments) of the kidney stone move through the tube that carries urine from the kidney to the bladder (ureter). Stone fragments may pass soon after the procedure, but they may continue to pass for up to 4-8 weeks. °? If you have severe pain or nausea, contact your health care provider. This may be caused by a large stone that was not broken up, and this may mean that you need more treatment. °· Some pain or discomfort during urination. °· Some pain or discomfort in the lower abdomen or (in men) at the base of the penis. ° °Follow these instructions at home: °Medicines °· Take over-the-counter and prescription medicines only as told by your health care provider. °· If you were prescribed an antibiotic medicine, take it as told by your health care provider. Do not stop taking the antibiotic even if you start to feel better. °· Do not drive for 24 hours if you were given a medicine to help you relax (sedative). °· Do not drive or use heavy machinery while taking prescription pain medicine. °Eating and drinking °· Drink enough water and fluids to keep your urine clear or pale yellow. This helps any remaining pieces of the stone to pass. It can also help prevent new stones from forming. °· Eat plenty of fresh fruits and vegetables. °· Follow instructions from your health care provider about eating and drinking restrictions. You may be  instructed: °? To reduce how much salt (sodium) you eat or drink. Check ingredients and nutrition facts on packaged foods and beverages. °? To reduce how much meat you eat. °· Eat the recommended amount of calcium for your age and gender. Ask your health care provider how much calcium you should have. °General instructions °· Get plenty of rest. °· Most people can resume normal activities 1-2 days after the procedure. Ask your health care provider what activities are safe for you. °· If directed, strain all urine through the strainer that was provided by your health care provider. °? Keep all fragments for your health care provider to see. Any stones that are found may be sent to a medical lab for examination. The stone may be as small as a grain of salt. °· Keep all follow-up visits as told by your health care provider. This is important. °Contact a health care provider if: °· You have pain that is severe or does not get better with medicine. °· You have nausea that is severe or does not go away. °· You have blood in your urine longer than your health care provider told you to expect. °· You have more blood in your urine. °· You have pain during urination that does not go away. °· You urinate more frequently than usual and this does not go away. °· You develop a rash or any other possible signs of an allergic reaction. °Get help right away if: °· You have severe pain in   your back, sides, or upper abdomen.  You have severe pain while urinating.  Your urine is very dark red.  You have blood in your stool (feces).  You cannot pass any urine at all.  You feel a strong urge to urinate after emptying your bladder.  You have a fever or chills.  You develop shortness of breath, difficulty breathing, or chest pain.  You have severe nausea that leads to persistent vomiting.  You faint. Summary  After this procedure, it is common to have some pain, discomfort, or nausea when pieces (fragments) of the  kidney stone move through the tube that carries urine from the kidney to the bladder (ureter). If this pain or nausea is severe, however, you should contact your health care provider.  Most people can resume normal activities 1-2 days after the procedure. Ask your health care provider what activities are safe for you.  Drink enough water and fluids to keep your urine clear or pale yellow. This helps any remaining pieces of the stone to pass, and it can help prevent new stones from forming.  If directed, strain your urine and keep all fragments for your health care provider to see. Fragments or stones may be as small as a grain of salt.  Get help right away if you have severe pain in your back, sides, or upper abdomen or have severe pain while urinating.  I have given you a prescription for Silodosin 8mg  daily.   This is to help relax the ureter and aid stone passage.   Side effects include nasal stuffiness and occasionally some light headedness and dizziness with standing.  It can also cause issues with ejaculation if you are sexually active, but that is reversible.   This information is not intended to replace advice given to you by your health care provider. Make sure you discuss any questions you have with your health care provider. Document Released: 10/08/2007 Document Revised: 08/09/2016 Document Reviewed: 08/09/2016 Elsevier Interactive Patient Education  [employer]2018 [employer]

## 2018-06-13 NOTE — Transfer of Care (Signed)
Immediate Anesthesia Transfer of Care Note  Patient: Unk Lightning  Procedure(s) Performed: LEFT EXTRACORPOREAL SHOCK WAVE LITHOTRIPSY (ESWL) WITH MAC (Left )  Patient Location: PACU  Anesthesia Type:MAC  Level of Consciousness: alert  and oriented  Airway & Oxygen Therapy: Patient Spontanous Breathing  Post-op Assessment: Report given to RN and Post -op Vital signs reviewed and stable  Post vital signs: Reviewed and stable  Last Vitals:  Vitals Value Taken Time  BP    Temp    Pulse    Resp    SpO2      Last Pain:  Vitals:   06/13/18 1105  TempSrc:   PainSc: 2       Patients Stated Pain Goal: 2 (17/40/81 4481)  Complications: No apparent anesthesia complications

## 2018-06-24 ENCOUNTER — Encounter (HOSPITAL_COMMUNITY): Payer: Self-pay | Admitting: Urology

## 2019-02-18 DIAGNOSIS — Z8669 Personal history of other diseases of the nervous system and sense organs: Secondary | ICD-10-CM | POA: Diagnosis not present

## 2019-02-18 DIAGNOSIS — M545 Low back pain: Secondary | ICD-10-CM | POA: Diagnosis not present

## 2019-02-18 DIAGNOSIS — M5430 Sciatica, unspecified side: Secondary | ICD-10-CM | POA: Diagnosis not present

## 2019-02-18 DIAGNOSIS — M170 Bilateral primary osteoarthritis of knee: Secondary | ICD-10-CM | POA: Diagnosis not present

## 2019-02-18 DIAGNOSIS — F411 Generalized anxiety disorder: Secondary | ICD-10-CM | POA: Diagnosis not present

## 2019-02-18 DIAGNOSIS — I10 Essential (primary) hypertension: Secondary | ICD-10-CM | POA: Diagnosis not present

## 2019-02-27 DIAGNOSIS — M5430 Sciatica, unspecified side: Secondary | ICD-10-CM | POA: Diagnosis not present

## 2019-02-28 DIAGNOSIS — M9903 Segmental and somatic dysfunction of lumbar region: Secondary | ICD-10-CM | POA: Diagnosis not present

## 2019-02-28 DIAGNOSIS — M9901 Segmental and somatic dysfunction of cervical region: Secondary | ICD-10-CM | POA: Diagnosis not present

## 2019-02-28 DIAGNOSIS — M9902 Segmental and somatic dysfunction of thoracic region: Secondary | ICD-10-CM | POA: Diagnosis not present

## 2019-02-28 DIAGNOSIS — M5442 Lumbago with sciatica, left side: Secondary | ICD-10-CM | POA: Diagnosis not present

## 2019-02-28 DIAGNOSIS — M9905 Segmental and somatic dysfunction of pelvic region: Secondary | ICD-10-CM | POA: Diagnosis not present

## 2019-03-03 DIAGNOSIS — M5442 Lumbago with sciatica, left side: Secondary | ICD-10-CM | POA: Diagnosis not present

## 2019-03-03 DIAGNOSIS — M9903 Segmental and somatic dysfunction of lumbar region: Secondary | ICD-10-CM | POA: Diagnosis not present

## 2019-03-03 DIAGNOSIS — M9905 Segmental and somatic dysfunction of pelvic region: Secondary | ICD-10-CM | POA: Diagnosis not present

## 2019-03-03 DIAGNOSIS — M9901 Segmental and somatic dysfunction of cervical region: Secondary | ICD-10-CM | POA: Diagnosis not present

## 2019-03-03 DIAGNOSIS — M9902 Segmental and somatic dysfunction of thoracic region: Secondary | ICD-10-CM | POA: Diagnosis not present

## 2019-03-07 DIAGNOSIS — M9905 Segmental and somatic dysfunction of pelvic region: Secondary | ICD-10-CM | POA: Diagnosis not present

## 2019-03-07 DIAGNOSIS — M9901 Segmental and somatic dysfunction of cervical region: Secondary | ICD-10-CM | POA: Diagnosis not present

## 2019-03-07 DIAGNOSIS — M5442 Lumbago with sciatica, left side: Secondary | ICD-10-CM | POA: Diagnosis not present

## 2019-03-07 DIAGNOSIS — M9902 Segmental and somatic dysfunction of thoracic region: Secondary | ICD-10-CM | POA: Diagnosis not present

## 2019-03-07 DIAGNOSIS — M9903 Segmental and somatic dysfunction of lumbar region: Secondary | ICD-10-CM | POA: Diagnosis not present

## 2019-03-14 DIAGNOSIS — M9901 Segmental and somatic dysfunction of cervical region: Secondary | ICD-10-CM | POA: Diagnosis not present

## 2019-03-14 DIAGNOSIS — M9902 Segmental and somatic dysfunction of thoracic region: Secondary | ICD-10-CM | POA: Diagnosis not present

## 2019-03-14 DIAGNOSIS — M9905 Segmental and somatic dysfunction of pelvic region: Secondary | ICD-10-CM | POA: Diagnosis not present

## 2019-03-14 DIAGNOSIS — M5442 Lumbago with sciatica, left side: Secondary | ICD-10-CM | POA: Diagnosis not present

## 2019-03-14 DIAGNOSIS — M9903 Segmental and somatic dysfunction of lumbar region: Secondary | ICD-10-CM | POA: Diagnosis not present

## 2019-03-21 DIAGNOSIS — M9901 Segmental and somatic dysfunction of cervical region: Secondary | ICD-10-CM | POA: Diagnosis not present

## 2019-03-21 DIAGNOSIS — M9902 Segmental and somatic dysfunction of thoracic region: Secondary | ICD-10-CM | POA: Diagnosis not present

## 2019-03-21 DIAGNOSIS — M5442 Lumbago with sciatica, left side: Secondary | ICD-10-CM | POA: Diagnosis not present

## 2019-03-21 DIAGNOSIS — M9903 Segmental and somatic dysfunction of lumbar region: Secondary | ICD-10-CM | POA: Diagnosis not present

## 2019-03-21 DIAGNOSIS — M9905 Segmental and somatic dysfunction of pelvic region: Secondary | ICD-10-CM | POA: Diagnosis not present

## 2019-04-09 DIAGNOSIS — M5442 Lumbago with sciatica, left side: Secondary | ICD-10-CM | POA: Diagnosis not present

## 2019-04-09 DIAGNOSIS — M9903 Segmental and somatic dysfunction of lumbar region: Secondary | ICD-10-CM | POA: Diagnosis not present

## 2019-04-09 DIAGNOSIS — M9902 Segmental and somatic dysfunction of thoracic region: Secondary | ICD-10-CM | POA: Diagnosis not present

## 2019-04-09 DIAGNOSIS — M9901 Segmental and somatic dysfunction of cervical region: Secondary | ICD-10-CM | POA: Diagnosis not present

## 2019-04-09 DIAGNOSIS — M9905 Segmental and somatic dysfunction of pelvic region: Secondary | ICD-10-CM | POA: Diagnosis not present

## 2019-04-15 ENCOUNTER — Ambulatory Visit (INDEPENDENT_AMBULATORY_CARE_PROVIDER_SITE_OTHER): Payer: BC Managed Care – PPO

## 2019-04-15 ENCOUNTER — Ambulatory Visit (INDEPENDENT_AMBULATORY_CARE_PROVIDER_SITE_OTHER): Payer: BC Managed Care – PPO | Admitting: Orthopedic Surgery

## 2019-04-15 ENCOUNTER — Other Ambulatory Visit: Payer: Self-pay

## 2019-04-15 ENCOUNTER — Encounter: Payer: Self-pay | Admitting: Orthopedic Surgery

## 2019-04-15 DIAGNOSIS — M5442 Lumbago with sciatica, left side: Secondary | ICD-10-CM

## 2019-04-15 NOTE — Progress Notes (Signed)
Office Visit Note   Patient: Seth [name]           Date of Birth: [birth year]           MRN: [id number] Visit Date: 04/15/2019 Requested by: Celene Squibb, MD [place],  [place] [id number] PCP: Celene Squibb, MD  Subjective: Chief Complaint  Patient presents with  . Lower Back - Pain    HPI: Pt is a 63 y.o. Male who presents to the clinic complaining of low back pain.  Pt states pain has been ongoing for 3 months but worse for the last 3 weeks.  He has tried physical therapy exercises at home, chiropracter visits, Ibuprofen, and Oxycodone with only mild relief.  Pain begins in his axial lumbar spine and radiates to his L buttock.  Pain is worse with coughing.  The pain awakens him at night often.  Denies any bowel/bladder dysfunction or any saddle anesthesia.  Denies any weakness.                ROS: All systems reviewed are negative as they relate to the chief complaint within the history of present illness.  Patient denies  fevers or chills.   Assessment & Plan: Visit Diagnoses:  1. Acute bilateral low back pain with left-sided sciatica     Plan: Pt has been experiencing back pain with associated sciatica to the L buttock for 3 months now.  He has nerve root tension signs.  Patient has tried and failed at-home therapy exercises due to pain and his back pain is not controlled by anti-inflammatories or his Oxycodone.  Pt requests ESI's.  I offered MRI of lumbar spine with referral to Dr. Ernestina Patches for ESI's. Pt agreed with plan.  Pt will followup with Dr. Ernestina Patches.    Follow-Up Instructions: No follow-ups on file.   Orders:  Orders Placed This Encounter  Procedures  . XR Lumbar Spine 2-3 Views   No orders of the defined types were placed in this encounter.     Procedures: No procedures performed   Clinical Data: No additional findings.  Objective: Vital Signs: There were no vitals taken for this visit.  Physical Exam:   Constitutional: Patient appears  well-developed HEENT:  Head: Normocephalic Eyes:EOM are normal Neck: Normal range of motion Cardiovascular: Normal rate Pulmonary/chest: Effort normal Neurologic: Patient is alert Skin: Skin is warm Psychiatric: Patient has normal mood and affect    Ortho Exam:  Back exam Positive SLR on L Negative SLR on R Moderately TTP over the axial lumbar spine with less tenderness over the lumbar paraspinal muscles No evidence of hypo or hyperreflexia No significant hip flexor, quadricep, dorsiflexion/planatar flexion weakness on L when compared to R   Specialty Comments:  No specialty comments available.  Imaging: Xr Lumbar Spine 2-3 Views  Result Date: 04/15/2019 AP lateral lumbar spine reviewed.  Some calcification of the aorta is present.  No spondylolisthesis but significant facet degenerative changes noted in the lower lumbar spine.  Visualized hips appear intact.  No acute fracture.  No significant degenerative disc disease between the vertebral bodies.    PMFS History: There are no active problems to display for this patient.  Past Medical History:  Diagnosis Date  . Anxiety   . Depression   . Diabetes mellitus without complication (HCC)    history of, no medications now after Bariatric surgery.  Marland Kitchen History of kidney stones   . Hypertension   . Osteoarthritis of both knees   .  Sleep apnea    dx. at one time, then they said no- mild, no cpap  . Transient ischemic attack (TIA)     History reviewed. No pertinent family history.  Past Surgical History:  Procedure Laterality Date  . ACHILLES TENDON SURGERY Right 1994  . [place] SURGERY     2005  . cataracts Bilateral 2008  . CHOLECYSTECTOMY    . EXTRACORPOREAL SHOCK WAVE LITHOTRIPSY Left 06/13/2018   Procedure: LEFT EXTRACORPOREAL SHOCK WAVE LITHOTRIPSY (ESWL) WITH MAC;  Surgeon: Irine Seal, MD;  Location: [facility];  Service: Urology;  Laterality: Left;  . repair of wrist fx Right   . TONSILLECTOMY     Social  History   Occupational History  . Not on file  Tobacco Use  . Smoking status: Never Smoker  . Smokeless tobacco: Never Used  Substance and Sexual Activity  . Alcohol use: Not Currently    Frequency: Never  . Drug use: Never  . Sexual activity: Not on file

## 2019-04-17 ENCOUNTER — Telehealth: Payer: Self-pay | Admitting: Orthopedic Surgery

## 2019-04-17 NOTE — Telephone Encounter (Signed)
Someone PLEASE call this patient. He was having a meltdown on the phone about the review of MRI. He refuses to pay the $94.00 and wanted the MRI moved to Friday. Will images be available then?  Tonisha, I included you because he is adamant about seeing Ernestina Patches for the MRI  REVIEW and states he can never get through the main number. Whatever is decided will someone let me know. What is schedule is a day and time but NOT the MD due to the $94

## 2019-04-17 NOTE — Telephone Encounter (Signed)
Please see message below. I have pt scheduled for MRI and after speaking with Dr. Ernestina Patches he advised he will proceed with injection once MRI is done.

## 2019-04-17 NOTE — Telephone Encounter (Signed)
Noted. Thanks for taking care of this for patient.

## 2019-04-17 NOTE — Telephone Encounter (Signed)
Called Seth [name] to schedule MRI REVIEW with Seth [name]. Patient stated he was NOT GOING TO DO THAT!! He was not paying another $94.00 to see Seth [name] because he told Dr, Marlou [name] he could not afford this and Seth [name] is saying Seth [name] told him he could go to Seth [name] which he gave me a hard time when I had initially called him.   He wants to speak to Seth [name] about this before he commits to coming to this appointment.  Please call patient @ [phone]

## 2019-04-17 NOTE — Telephone Encounter (Signed)
Spoke with Dr. Ernestina Patches and he states injection and MRI Review can be scheduled same day. Advised pt and he is scheduled for 05/06/2019 with driver.

## 2019-04-18 DIAGNOSIS — M9902 Segmental and somatic dysfunction of thoracic region: Secondary | ICD-10-CM | POA: Diagnosis not present

## 2019-04-18 DIAGNOSIS — M9905 Segmental and somatic dysfunction of pelvic region: Secondary | ICD-10-CM | POA: Diagnosis not present

## 2019-04-18 DIAGNOSIS — M9903 Segmental and somatic dysfunction of lumbar region: Secondary | ICD-10-CM | POA: Diagnosis not present

## 2019-04-18 DIAGNOSIS — M5442 Lumbago with sciatica, left side: Secondary | ICD-10-CM | POA: Diagnosis not present

## 2019-04-18 DIAGNOSIS — M9901 Segmental and somatic dysfunction of cervical region: Secondary | ICD-10-CM | POA: Diagnosis not present

## 2019-04-19 IMAGING — CR DG ABDOMEN 1V
2 series · 2 of 2 positions shown · non-contrast
Comparison: CT abdomen and pelvis August 08, 2017. Lumbar spine
series May 30, 2018

CLINICAL DATA: Ureteral calculus

EXAM:
ABDOMEN - 1 VIEW

[t abdomen supine * (1 of 2)]
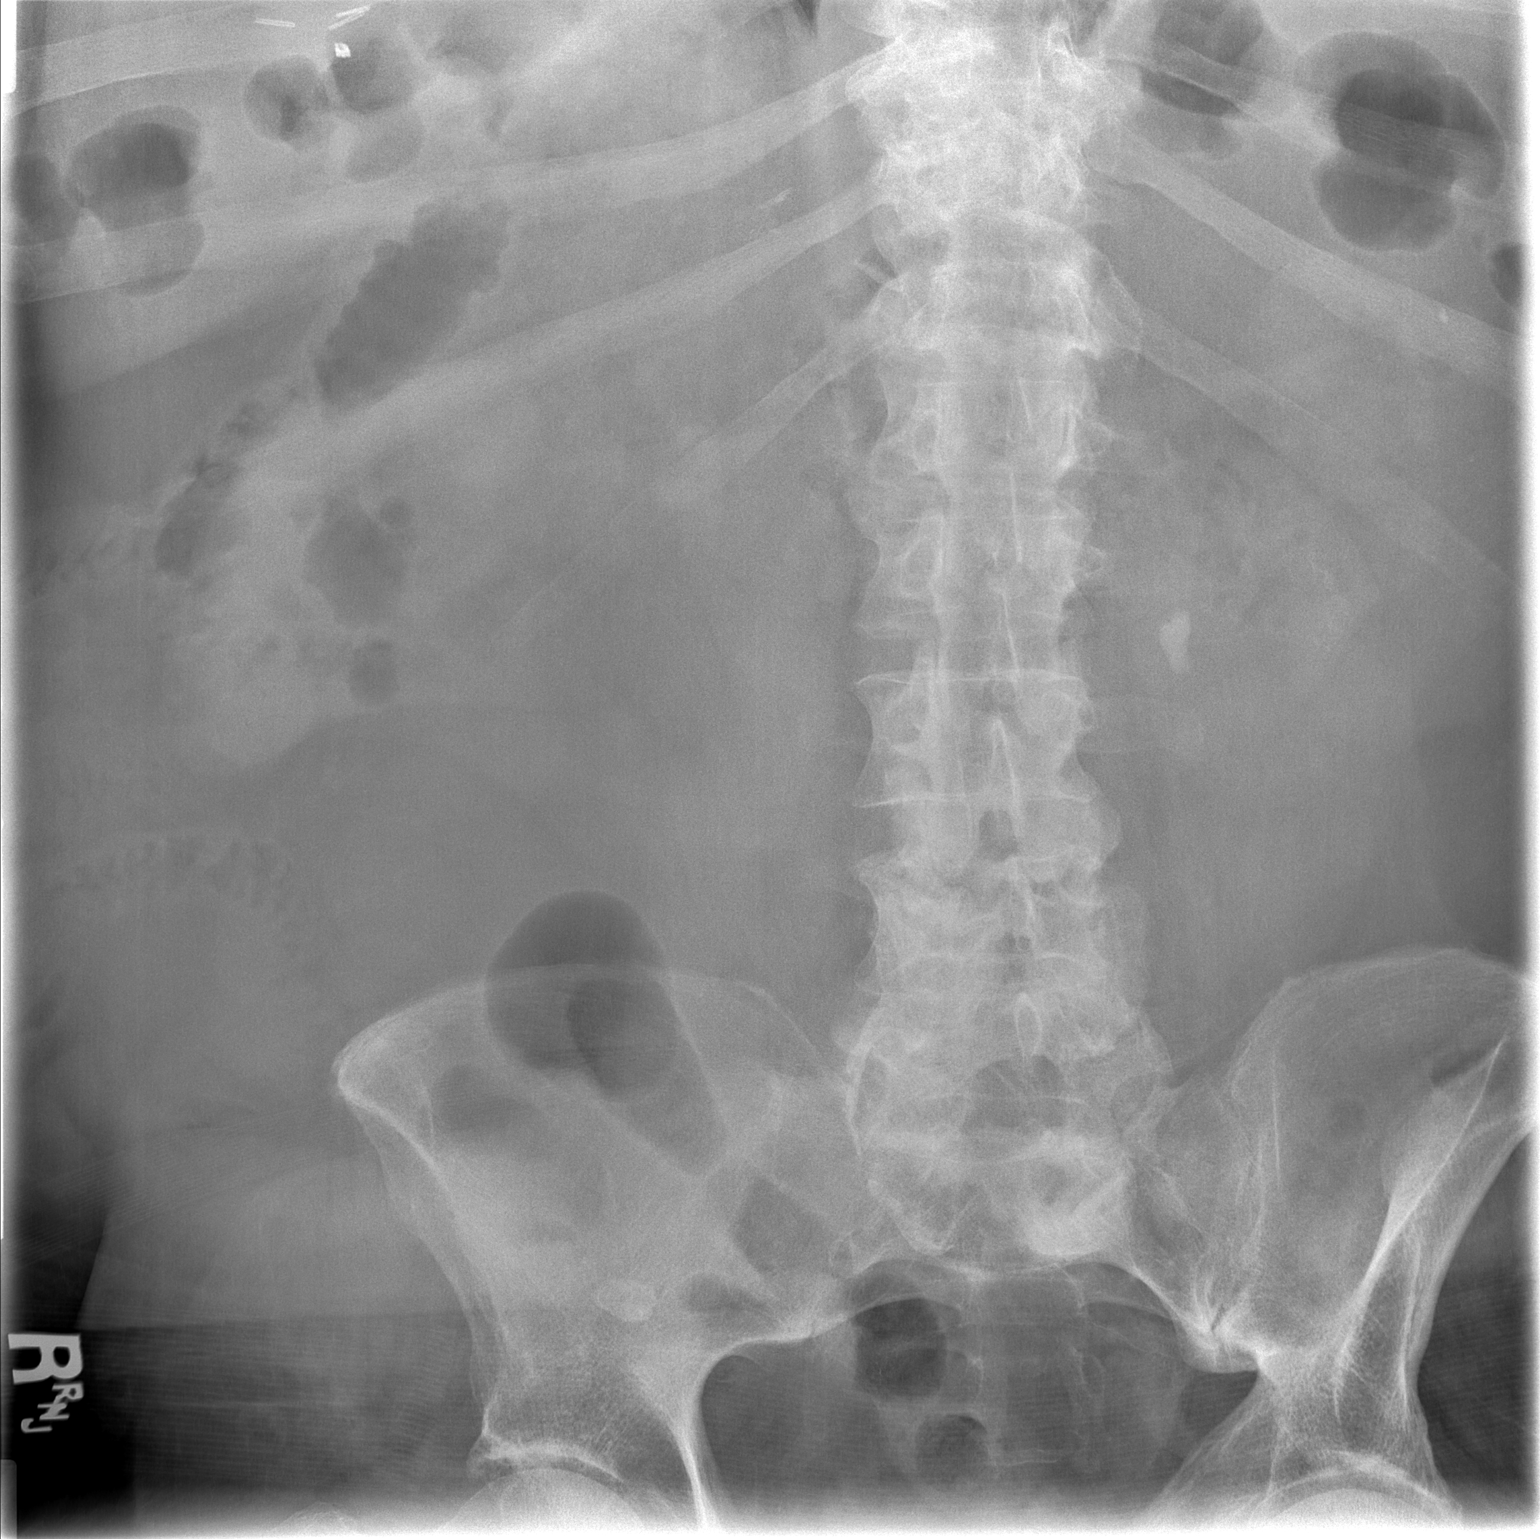

[t abdomen supine * (2 of 2)]
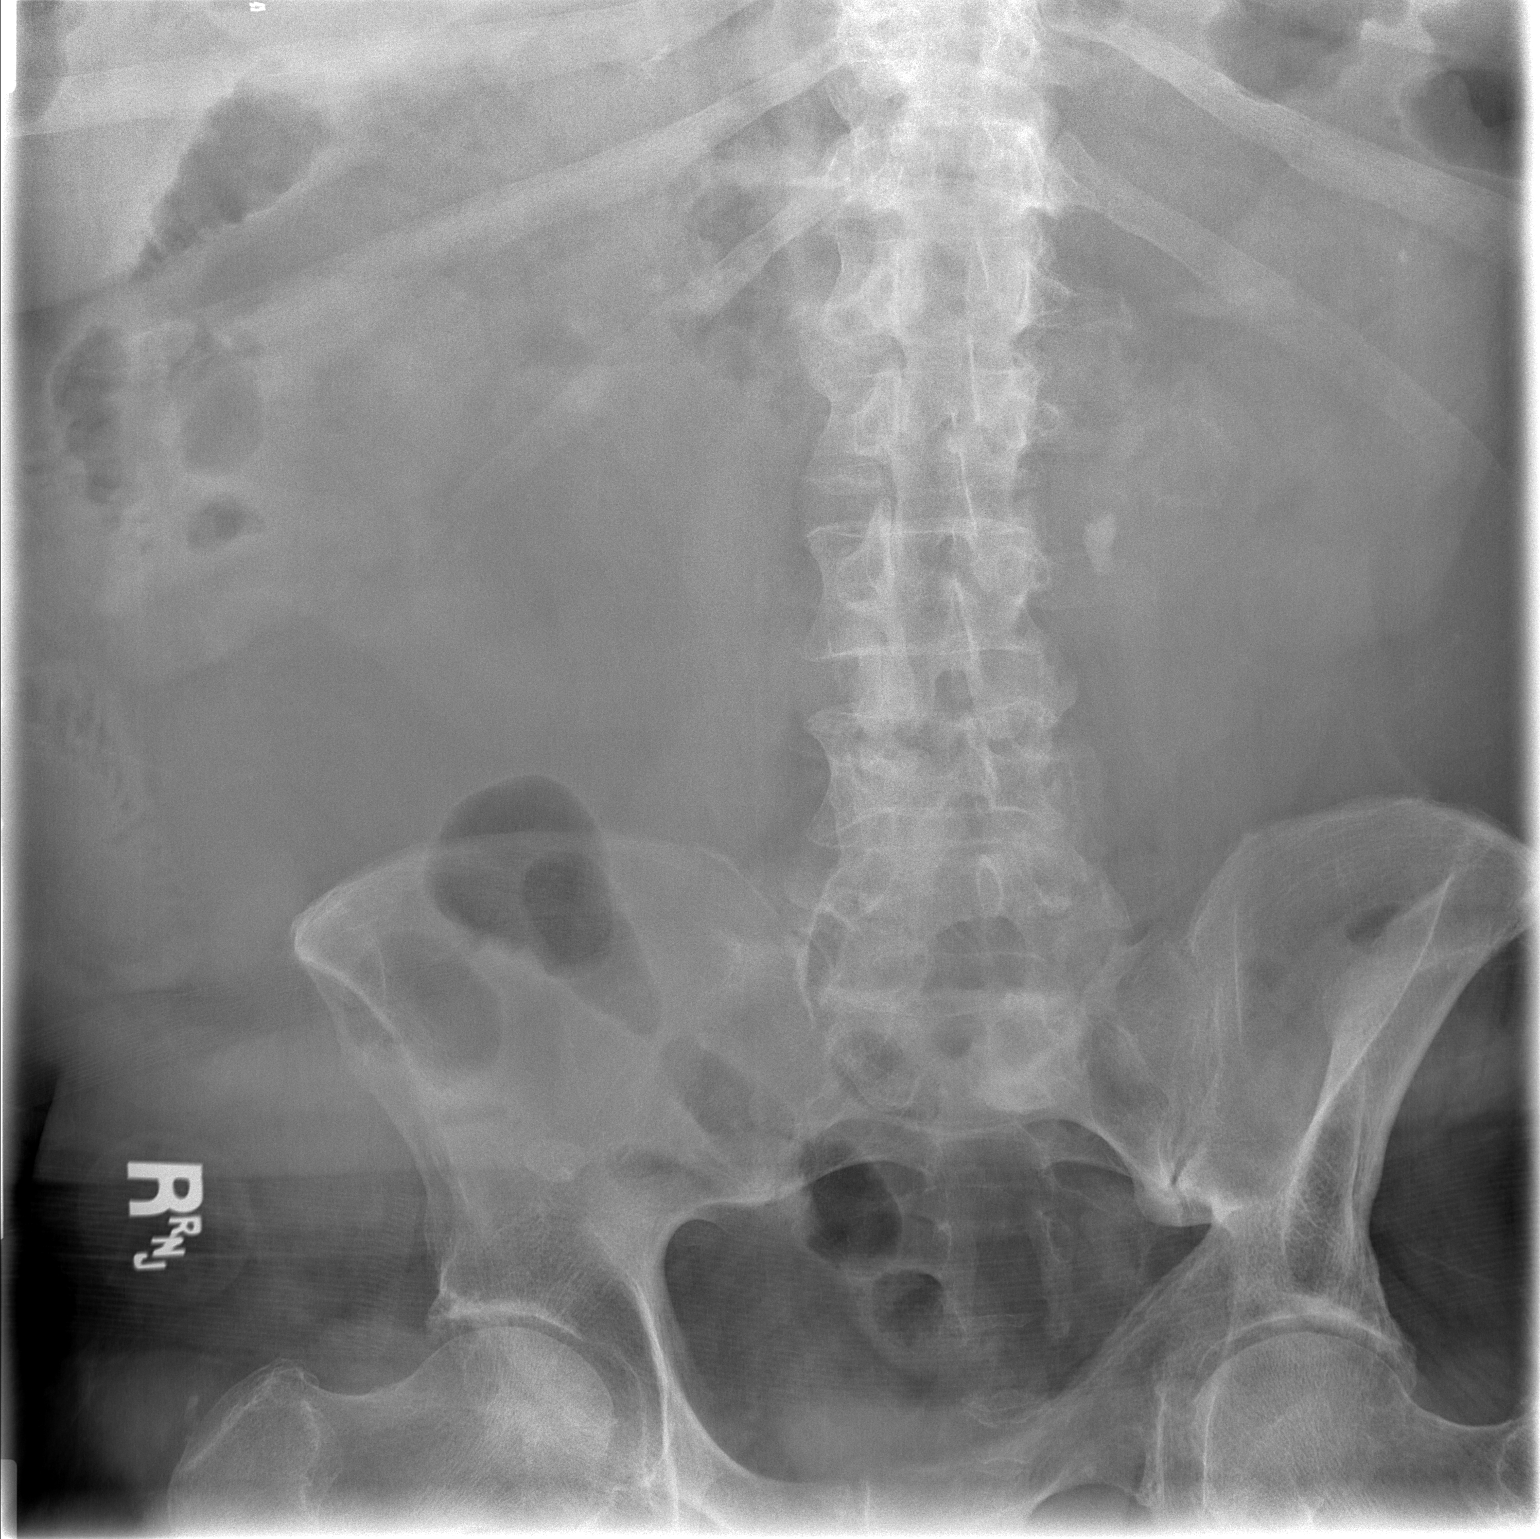

[2 of 2 positions shown; findings below may reference images not displayed]

FINDINGS: There is a calculus on the left at the L2-3 level measuring 1.7 x
0.9 cm. No new renal or ureteral calculus evident. There is moderate
stool in the colon. There is no bowel dilatation or air-fluid level
to suggest bowel obstruction. No evident free air. Surgical clips
noted in right upper quadrant.
IMPRESSION: 1.7 x 0.9 cm calculus, L2-3 level on the left, apparently within the
proximal left ureter.

No bowel obstruction or free air evident. Surgical clips right upper
quadrant.

## 2019-04-23 ENCOUNTER — Ambulatory Visit
Admission: RE | Admit: 2019-04-23 | Discharge: 2019-04-23 | Disposition: A | Discharge status: Home / Self Care | Payer: BC Managed Care – PPO | Source: Ambulatory Visit | Attending: Orthopedic Surgery | Admitting: Orthopedic Surgery

## 2019-04-23 ENCOUNTER — Other Ambulatory Visit: Payer: Self-pay

## 2019-04-23 DIAGNOSIS — M4316 Spondylolisthesis, lumbar region: Secondary | ICD-10-CM | POA: Diagnosis not present

## 2019-04-23 DIAGNOSIS — M4726 Other spondylosis with radiculopathy, lumbar region: Secondary | ICD-10-CM | POA: Diagnosis not present

## 2019-04-23 DIAGNOSIS — M5442 Lumbago with sciatica, left side: Secondary | ICD-10-CM

## 2019-04-23 DIAGNOSIS — M48061 Spinal stenosis, lumbar region without neurogenic claudication: Secondary | ICD-10-CM | POA: Diagnosis not present

## 2019-04-25 ENCOUNTER — Ambulatory Visit: Payer: BC Managed Care – PPO | Admitting: Orthopedic Surgery

## 2019-04-28 ENCOUNTER — Ambulatory Visit: Payer: BC Managed Care – PPO | Admitting: Orthopedic Surgery

## 2019-05-06 ENCOUNTER — Encounter: Payer: Self-pay | Admitting: Physical Medicine and Rehabilitation

## 2019-05-06 ENCOUNTER — Other Ambulatory Visit: Payer: Self-pay

## 2019-05-06 ENCOUNTER — Ambulatory Visit (INDEPENDENT_AMBULATORY_CARE_PROVIDER_SITE_OTHER): Payer: Medicare Other | Admitting: Physical Medicine and Rehabilitation

## 2019-05-06 ENCOUNTER — Ambulatory Visit: Payer: Self-pay

## 2019-05-06 VITALS — BP 136/86 | HR 65

## 2019-05-06 DIAGNOSIS — M5416 Radiculopathy, lumbar region: Secondary | ICD-10-CM | POA: Diagnosis not present

## 2019-05-06 DIAGNOSIS — M48062 Spinal stenosis, lumbar region with neurogenic claudication: Secondary | ICD-10-CM

## 2019-05-06 DIAGNOSIS — M4316 Spondylolisthesis, lumbar region: Secondary | ICD-10-CM

## 2019-05-06 MED ORDER — METHYLPREDNISOLONE ACETATE 80 MG/ML IJ SUSP
80.0000 mg | Freq: Once | INTRAMUSCULAR | Status: AC
  Administered 2019-05-06: 80 mg

## 2019-05-06 NOTE — Progress Notes (Signed)
   Numeric Pain Rating Scale and Functional Assessment Average Pain (8)   In the last MONTH (on 0-10 scale) has pain interfered with the following?  1. General activity like being  able to carry out your everyday physical activities such as walking, climbing stairs, carrying groceries, or moving a chair?  Rating(9)   +Driver, -BT, -Dye Allergies.  

## 2019-05-16 NOTE — Procedures (Signed)
Lumbar Epidural Steroid Injection - Interlaminar Approach with Fluoroscopic Guidance  Patient: Seth [name]      Date of Birth: [birth year] MRN: [id number] PCP: Celene Squibb, MD      Visit Date: 05/06/2019   Universal Protocol:     Consent Given By: the patient  Position: PRONE  Additional Comments: Vital signs were monitored before and after the procedure. Patient was prepped and draped in the usual sterile fashion. The correct patient, procedure, and site was verified.   Injection Procedure Details:  Procedure Site One Meds Administered:  Meds ordered this encounter  Medications  . methylPREDNISolone acetate (DEPO-MEDROL) injection 80 mg     Laterality: Left  Location/Site:  L4-L5  Needle size: 20 G  Needle type: Tuohy  Needle Placement: Paramedian epidural  Findings:   -Comments: Excellent flow of contrast into the epidural space.  Procedure Details: Using a paramedian approach from the side mentioned above, the region overlying the inferior lamina was localized under fluoroscopic visualization and the soft tissues overlying this structure were infiltrated with 4 ml. of 1% Lidocaine without Epinephrine. The Tuohy needle was inserted into the epidural space using a paramedian approach.   The epidural space was localized using loss of resistance along with lateral and bi-planar fluoroscopic views.  After negative aspirate for air, blood, and CSF, a 2 ml. volume of Isovue-250 was injected into the epidural space and the flow of contrast was observed. Radiographs were obtained for documentation purposes.    The injectate was administered into the level noted above.   Additional Comments:  The patient tolerated the procedure well Dressing: 2 x 2 sterile gauze and Band-Aid    Post-procedure details: Patient was observed during the procedure. Post-procedure instructions were reviewed.  Patient left the clinic in stable condition.

## 2019-05-16 NOTE — Progress Notes (Signed)
Unk Lightning - 63 y.o. male MRN [id number]  Date of birth: 05-09-1956  Office Visit Note: Visit Date: 05/06/2019 PCP: Celene Squibb, MD Referred by: Celene Squibb, MD  Subjective: Chief Complaint  Patient presents with  . Lower Back - Pain   HPI:  Seth [name] is a 63 y.o. male who comes in today For planned left L4-5 interlaminar steroid injection as requested by Dr. Anderson Malta.  Patient did have a recent MRI of the lumbar spine which he has not seen but was notified by Dr. Marlou Sa over the phone.  We reviewed his MRI today with him in the office with images and spine model.  He does have arthritic change at L4-5 with moderate multifactorial stenosis and small listhesis.  He is getting pain in the left buttock region across the lower back worse over the last 4 years but significantly worse over the last 4 months.  No specific injury no prior surgery.  He is failed conservative care otherwise with medication management time and activity modification.  We are going to try the L4-5 interlaminar injection today diagnostically and hopefully therapeutically.  Consideration would be given to transforaminal approach versus facet joint block for more back pain.  We will see him back as needed he will continue follow-up with Dr. Marlou Sa for his orthopedic care.  ROS Otherwise per HPI.  Assessment & Plan: Visit Diagnoses:  1. Lumbar radiculopathy   2. Spinal stenosis of lumbar region with neurogenic claudication   3. Spondylolisthesis of lumbar region     Plan: No additional findings.   Meds & Orders:  Meds ordered this encounter  Medications  . methylPREDNISolone acetate (DEPO-MEDROL) injection 80 mg    Orders Placed This Encounter  Procedures  . XR C-ARM NO REPORT  . Epidural Steroid injection    Follow-up: No follow-ups on file.   Procedures: No procedures performed  Lumbar Epidural Steroid Injection - Interlaminar Approach with Fluoroscopic Guidance  Patient: Seth [name]       Date of Birth: [birth year] MRN: [id number] PCP: Celene Squibb, MD      Visit Date: 05/06/2019   Universal Protocol:     Consent Given By: the patient  Position: PRONE  Additional Comments: Vital signs were monitored before and after the procedure. Patient was prepped and draped in the usual sterile fashion. The correct patient, procedure, and site was verified.   Injection Procedure Details:  Procedure Site One Meds Administered:  Meds ordered this encounter  Medications  . methylPREDNISolone acetate (DEPO-MEDROL) injection 80 mg     Laterality: Left  Location/Site:  L4-L5  Needle size: 20 G  Needle type: Tuohy  Needle Placement: Paramedian epidural  Findings:   -Comments: Excellent flow of contrast into the epidural space.  Procedure Details: Using a paramedian approach from the side mentioned above, the region overlying the inferior lamina was localized under fluoroscopic visualization and the soft tissues overlying this structure were infiltrated with 4 ml. of 1% Lidocaine without Epinephrine. The Tuohy needle was inserted into the epidural space using a paramedian approach.   The epidural space was localized using loss of resistance along with lateral and bi-planar fluoroscopic views.  After negative aspirate for air, blood, and CSF, a 2 ml. volume of Isovue-250 was injected into the epidural space and the flow of contrast was observed. Radiographs were obtained for documentation purposes.    The injectate was administered into the level noted above.   Additional Comments:  The  patient tolerated the procedure well Dressing: 2 x 2 sterile gauze and Band-Aid    Post-procedure details: Patient was observed during the procedure. Post-procedure instructions were reviewed.  Patient left the clinic in stable condition.   Clinical History: MRI LUMBAR SPINE WITHOUT CONTRAST  TECHNIQUE: Multiplanar, multisequence MR imaging of the lumbar spine was performed. No  intravenous contrast was administered.  COMPARISON:  Abdominal CT 08/08/2017  FINDINGS: Segmentation:  Standard lumbar numbering  Alignment:  Grade 1 anterolisthesis at L4-5  Vertebrae: Mild marrow edema in the anterior superior corner of S1 with mild adjacent prevertebral fat edema-new. No fracture or discitis.  Conus medullaris and cauda equina: Conus extends to the T12-L1 level. Conus and cauda equina appear normal.  Paraspinal and other soft tissues: Negative  Disc levels:  T12- L1: Spondylosis and mild facet spurring.  No impingement  L1-L2: Facet spurring.  Minor annulus bulging.  No impingement  L2-L3: Mild facet spurring and annulus bulging.  No impingement  L3-L4: Facet spurring and epidural fat effacement causes mild-to-moderate thecal sac narrowing. Patent foramina  L4-L5: Advanced facet arthropathy with spurring and anterolisthesis. Mild bulging of the uncovered disc. Bilateral foraminal narrowing which is mild. Moderate thecal sac narrowing.  L5-S1:Bilateral facet spurring which is bulky. Spondylosis with anterior spurring as described above. Epidural fat expansion without complete thecal sac effacement.  IMPRESSION: 1. Prominent facet osteoarthritis especially at L4-5 where there is grade 1 anterolisthesis. 2. Thecal sac narrowing that is moderate at L4-5 and mild-to-moderate at L3-4. 3. Mild marrow and fat edema around an S1 osteophyte, which may be symptomatic.   Electronically Signed   By: Marnee SpringJonathon  Watts M.D.   On: 04/24/2019 09:35     Objective:  VS:  HT:    WT:   BMI:     BP:136/86  HR:65bpm  TEMP: ( )  RESP:  Physical Exam  Ortho Exam Imaging: No results found.

## 2019-05-20 DIAGNOSIS — M5430 Sciatica, unspecified side: Secondary | ICD-10-CM | POA: Diagnosis not present

## 2019-05-20 DIAGNOSIS — M170 Bilateral primary osteoarthritis of knee: Secondary | ICD-10-CM | POA: Diagnosis not present

## 2019-05-20 DIAGNOSIS — F411 Generalized anxiety disorder: Secondary | ICD-10-CM | POA: Diagnosis not present

## 2019-05-20 DIAGNOSIS — M545 Low back pain: Secondary | ICD-10-CM | POA: Diagnosis not present

## 2019-05-20 DIAGNOSIS — I10 Essential (primary) hypertension: Secondary | ICD-10-CM | POA: Diagnosis not present

## 2019-05-20 DIAGNOSIS — N529 Male erectile dysfunction, unspecified: Secondary | ICD-10-CM | POA: Diagnosis not present

## 2019-05-20 DIAGNOSIS — M4800 Spinal stenosis, site unspecified: Secondary | ICD-10-CM | POA: Diagnosis not present

## 2019-05-20 DIAGNOSIS — Z8669 Personal history of other diseases of the nervous system and sense organs: Secondary | ICD-10-CM | POA: Diagnosis not present

## 2019-06-17 DIAGNOSIS — E1165 Type 2 diabetes mellitus with hyperglycemia: Secondary | ICD-10-CM | POA: Diagnosis not present

## 2019-06-17 DIAGNOSIS — I10 Essential (primary) hypertension: Secondary | ICD-10-CM | POA: Diagnosis not present

## 2019-06-17 DIAGNOSIS — N529 Male erectile dysfunction, unspecified: Secondary | ICD-10-CM | POA: Diagnosis not present

## 2019-06-23 DIAGNOSIS — M545 Low back pain: Secondary | ICD-10-CM | POA: Diagnosis not present

## 2019-06-23 DIAGNOSIS — F419 Anxiety disorder, unspecified: Secondary | ICD-10-CM | POA: Diagnosis not present

## 2019-06-23 DIAGNOSIS — G8929 Other chronic pain: Secondary | ICD-10-CM | POA: Diagnosis not present

## 2019-06-23 DIAGNOSIS — R7303 Prediabetes: Secondary | ICD-10-CM | POA: Diagnosis not present

## 2019-06-23 DIAGNOSIS — M5430 Sciatica, unspecified side: Secondary | ICD-10-CM | POA: Diagnosis not present

## 2019-06-23 DIAGNOSIS — Z136 Encounter for screening for cardiovascular disorders: Secondary | ICD-10-CM | POA: Diagnosis not present

## 2019-06-23 DIAGNOSIS — Z0001 Encounter for general adult medical examination with abnormal findings: Secondary | ICD-10-CM | POA: Diagnosis not present

## 2019-06-23 DIAGNOSIS — I10 Essential (primary) hypertension: Secondary | ICD-10-CM | POA: Diagnosis not present

## 2019-06-23 DIAGNOSIS — M170 Bilateral primary osteoarthritis of knee: Secondary | ICD-10-CM | POA: Diagnosis not present

## 2019-06-23 DIAGNOSIS — E782 Mixed hyperlipidemia: Secondary | ICD-10-CM | POA: Diagnosis not present

## 2019-06-23 DIAGNOSIS — M25569 Pain in unspecified knee: Secondary | ICD-10-CM | POA: Diagnosis not present

## 2019-07-10 DIAGNOSIS — Z23 Encounter for immunization: Secondary | ICD-10-CM | POA: Diagnosis not present

## 2019-08-16 DIAGNOSIS — M2351 Chronic instability of knee, right knee: Secondary | ICD-10-CM | POA: Diagnosis not present

## 2019-08-16 DIAGNOSIS — M170 Bilateral primary osteoarthritis of knee: Secondary | ICD-10-CM | POA: Diagnosis not present

## 2019-08-21 DIAGNOSIS — R2689 Other abnormalities of gait and mobility: Secondary | ICD-10-CM | POA: Diagnosis not present

## 2019-08-21 DIAGNOSIS — M1711 Unilateral primary osteoarthritis, right knee: Secondary | ICD-10-CM | POA: Diagnosis not present

## 2019-08-21 DIAGNOSIS — M25561 Pain in right knee: Secondary | ICD-10-CM | POA: Diagnosis not present

## 2019-08-27 DIAGNOSIS — M9903 Segmental and somatic dysfunction of lumbar region: Secondary | ICD-10-CM | POA: Diagnosis not present

## 2019-08-27 DIAGNOSIS — M9901 Segmental and somatic dysfunction of cervical region: Secondary | ICD-10-CM | POA: Diagnosis not present

## 2019-08-27 DIAGNOSIS — M542 Cervicalgia: Secondary | ICD-10-CM | POA: Diagnosis not present

## 2019-08-27 DIAGNOSIS — M9905 Segmental and somatic dysfunction of pelvic region: Secondary | ICD-10-CM | POA: Diagnosis not present

## 2019-08-27 DIAGNOSIS — M9902 Segmental and somatic dysfunction of thoracic region: Secondary | ICD-10-CM | POA: Diagnosis not present

## 2019-08-27 DIAGNOSIS — M545 Low back pain: Secondary | ICD-10-CM | POA: Diagnosis not present

## 2019-08-29 DIAGNOSIS — M25561 Pain in right knee: Secondary | ICD-10-CM | POA: Diagnosis not present

## 2019-08-29 DIAGNOSIS — M1711 Unilateral primary osteoarthritis, right knee: Secondary | ICD-10-CM | POA: Diagnosis not present

## 2019-08-29 DIAGNOSIS — R2689 Other abnormalities of gait and mobility: Secondary | ICD-10-CM | POA: Diagnosis not present

## 2019-09-04 DIAGNOSIS — M1711 Unilateral primary osteoarthritis, right knee: Secondary | ICD-10-CM | POA: Diagnosis not present

## 2019-09-04 DIAGNOSIS — M25561 Pain in right knee: Secondary | ICD-10-CM | POA: Diagnosis not present

## 2019-09-04 DIAGNOSIS — R2689 Other abnormalities of gait and mobility: Secondary | ICD-10-CM | POA: Diagnosis not present

## 2019-09-12 DIAGNOSIS — M1711 Unilateral primary osteoarthritis, right knee: Secondary | ICD-10-CM | POA: Diagnosis not present

## 2019-09-12 DIAGNOSIS — R2689 Other abnormalities of gait and mobility: Secondary | ICD-10-CM | POA: Diagnosis not present

## 2019-09-12 DIAGNOSIS — M25561 Pain in right knee: Secondary | ICD-10-CM | POA: Diagnosis not present

## 2020-02-12 ENCOUNTER — Telehealth: Payer: Self-pay | Admitting: Physical Medicine and Rehabilitation

## 2020-02-12 NOTE — Telephone Encounter (Signed)
Patient left a message requesting a repeat of his last injection. He states that the same pain is starting again. Left L4-5 IL 05/06/2019. Ok to repeat?

## 2020-02-13 NOTE — Telephone Encounter (Signed)
Ok to repeat if all ok

## 2020-02-13 NOTE — Telephone Encounter (Signed)
Pt is scheduled for 03/15/20 at 1000, no BTs and understood he needs a driver at appt time.   No pa is needed for [id number] through [employer], pt has medicaid.

## 2020-02-27 IMAGING — MR MRI LUMBAR SPINE WITHOUT CONTRAST
4 of 5 series · 18 of 48 positions shown · non-contrast
Comparison: Abdominal CT 08/08/2017

CLINICAL DATA: Low back pain radiating to the left buttocks for 1
year

EXAM:
MRI LUMBAR SPINE WITHOUT CONTRAST
TECHNIQUE: Multiplanar, multisequence MR imaging of the lumbar spine was
performed. No intravenous contrast was administered.

[Series 8: T2 · sagittal · 4.0mm · 0.73mm/px · 6 of 16 slices shown (1 of 2)]
[im 1/16]
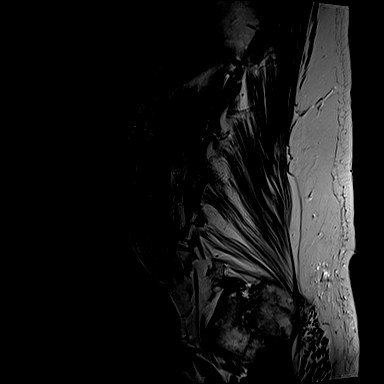
[im 4/16]
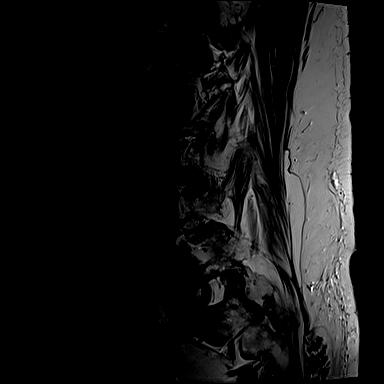
[im 7/16]
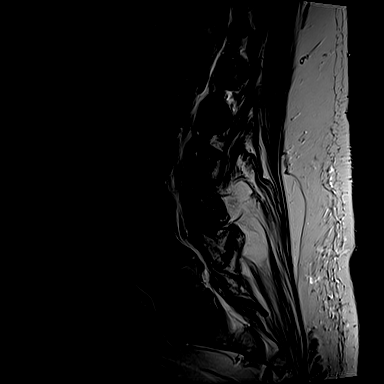
[im 10/16]
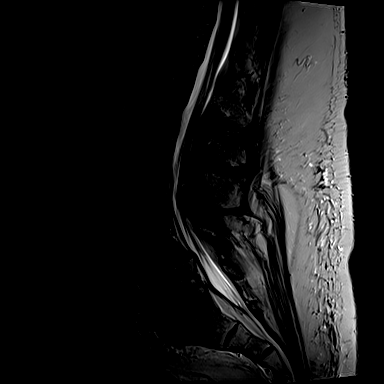
[im 13/16]
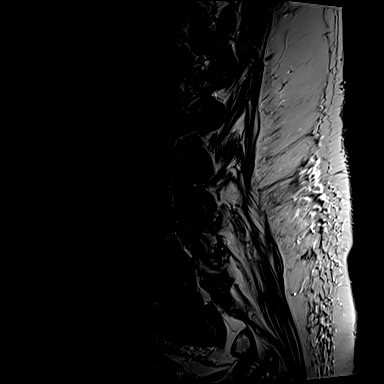
[im 16/16]
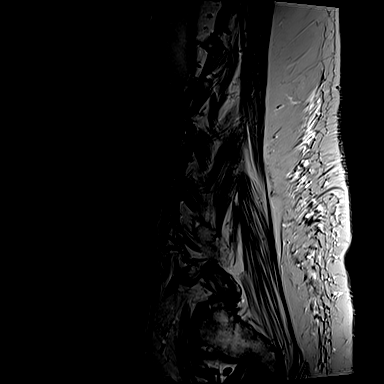

[Series 9: T1 · sagittal · 4.0mm · 0.73mm/px · 3 of 16 slices shown (1 of 2)]
[im 1/16]
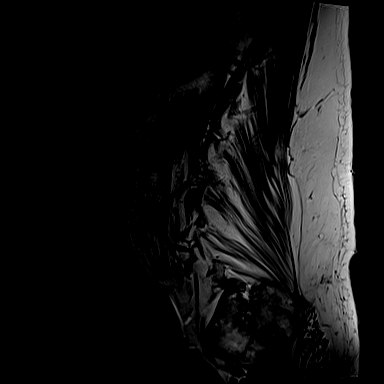
[im 8/16]
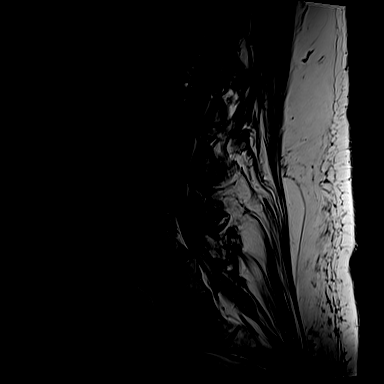
[im 16/16]
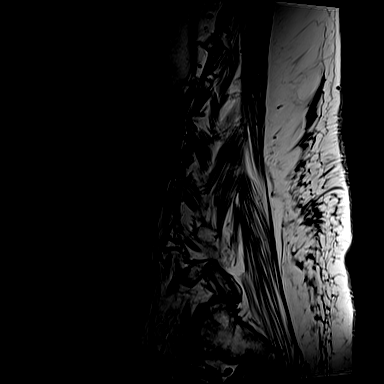

[Series 13: T2 · axial · 4.0mm · 0.31mm/px · z∈[-241,-16]mm · 6 of 50 slices shown (2 of 2)]
[im 4/50]
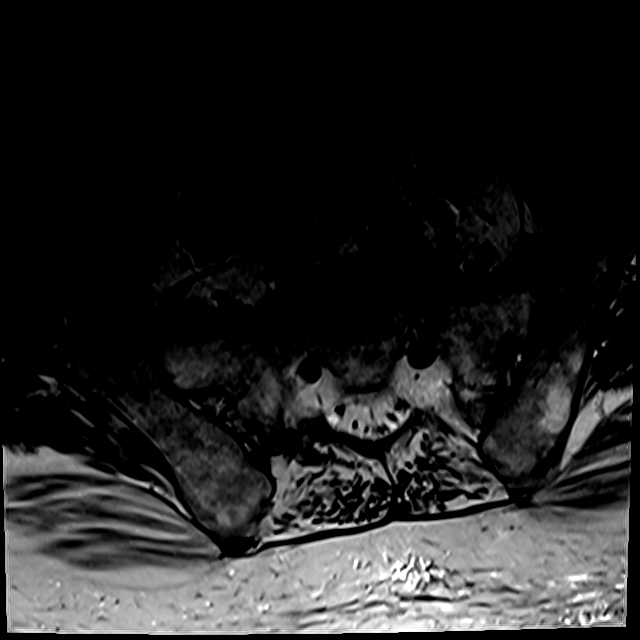
[im 7/50]
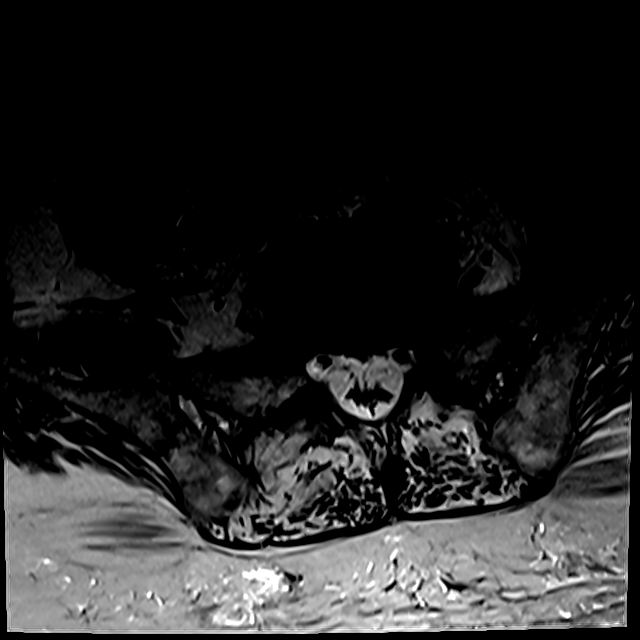
[im 10/50]
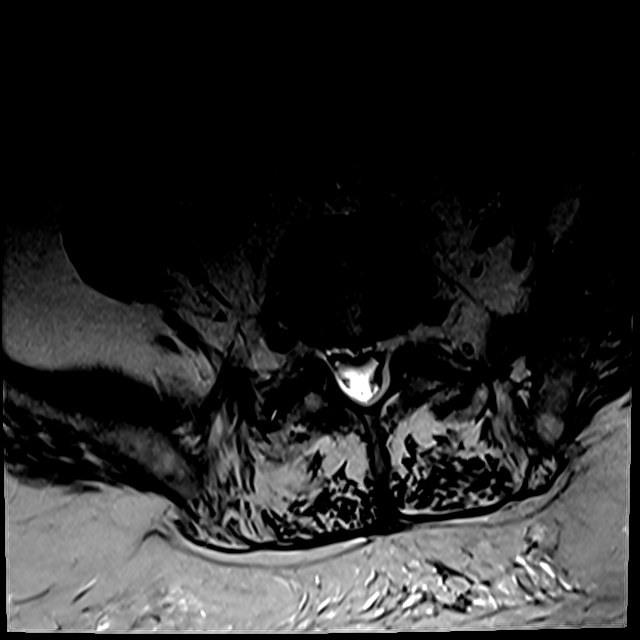
[im 17/50]
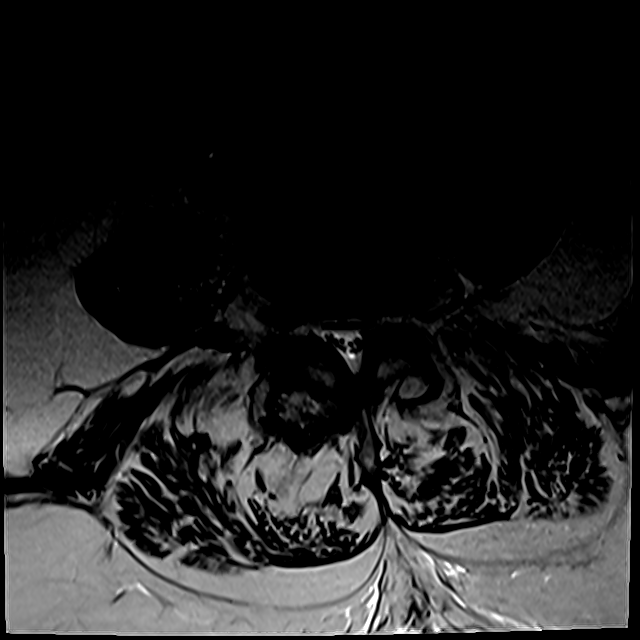
[im 27/50]
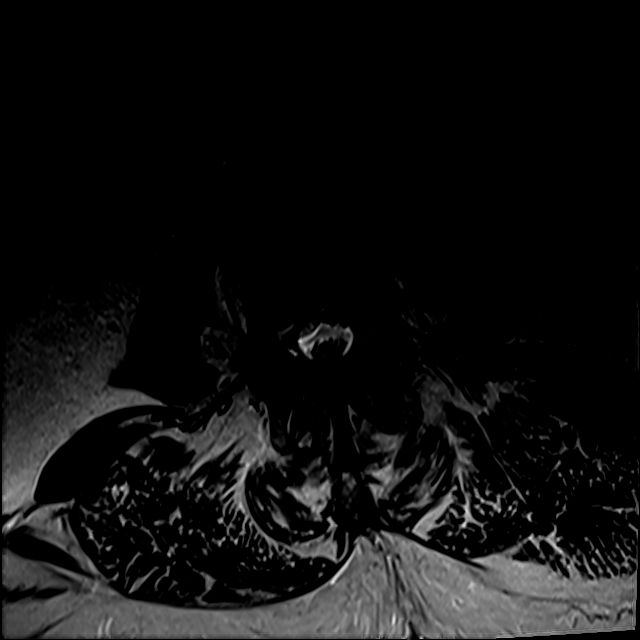
[im 43/50]
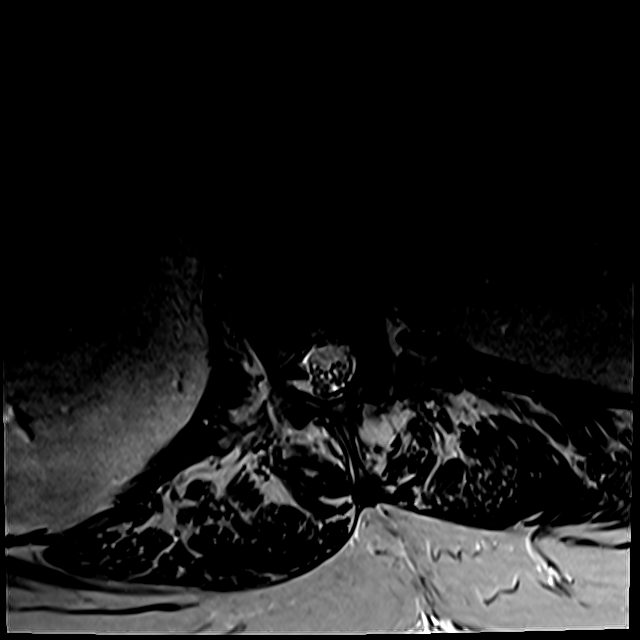

[Series 100: T1 · axial · 4.0mm · 0.31mm/px · z∈[-226,-16]mm · 3 of 50 slices shown (2 of 2)]
[im 7/50]
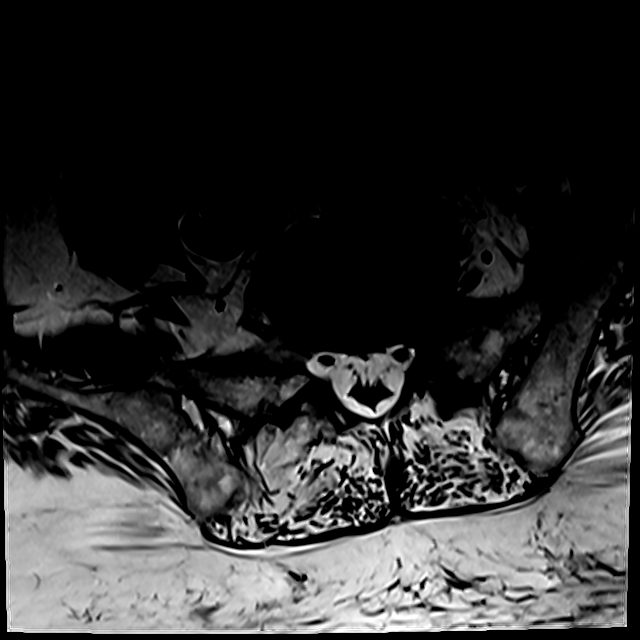
[im 27/50]
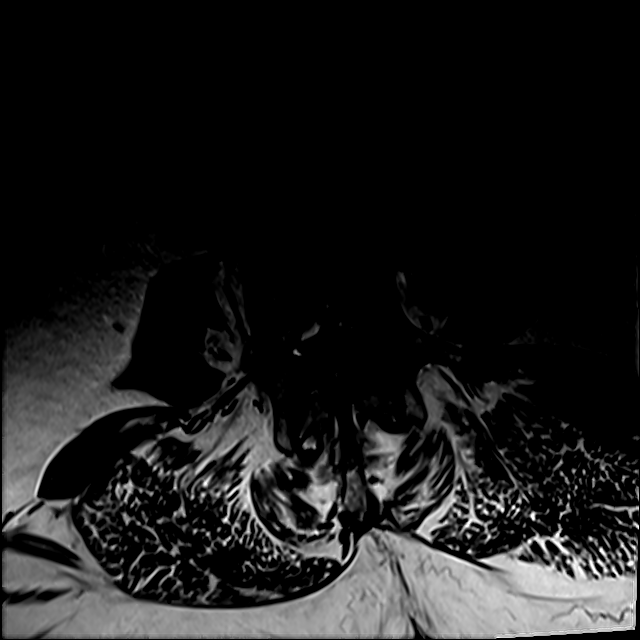
[im 43/50]
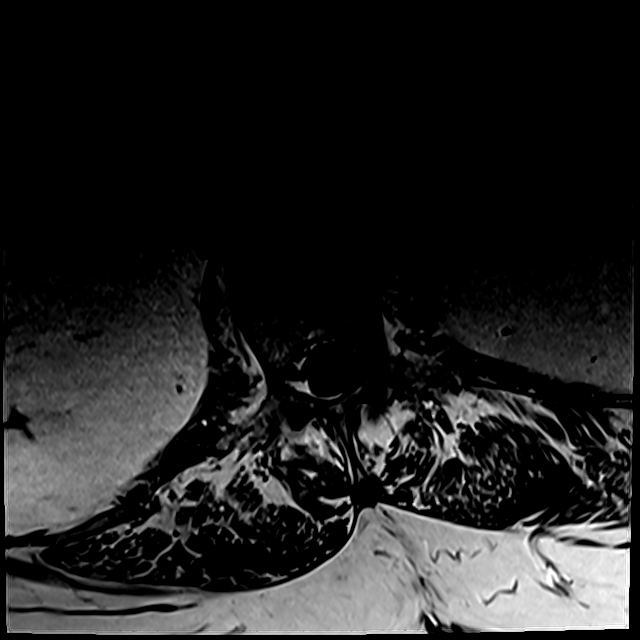

[18 of 48 positions shown; findings below may reference images not displayed]

FINDINGS: Segmentation:  Standard lumbar numbering

Alignment:  Grade 1 anterolisthesis at L4-5

Vertebrae: Mild marrow edema in the anterior superior corner of S1
with mild adjacent prevertebral fat edema-new. No fracture or
discitis.

Conus medullaris and cauda equina: Conus extends to the T12-L1
level. Conus and cauda equina appear normal.

Paraspinal and other soft tissues: Negative

Disc levels:

T12- L1: Spondylosis and mild facet spurring.  No impingement

L1-L2: Facet spurring.  Minor annulus bulging.  No impingement

L2-L3: Mild facet spurring and annulus bulging.  No impingement

L3-L4: Facet spurring and epidural fat effacement causes
mild-to-moderate thecal sac narrowing. Patent foramina

L4-L5: Advanced facet arthropathy with spurring and anterolisthesis.
Mild bulging of the uncovered disc. Bilateral foraminal narrowing
which is mild. Moderate thecal sac narrowing.

L5-S1:Bilateral facet spurring which is bulky. Spondylosis with
anterior spurring as described above. Epidural fat expansion without
complete thecal sac effacement.
IMPRESSION: 1. Prominent facet osteoarthritis especially at L4-5 where there is
grade 1 anterolisthesis.
2. Thecal sac narrowing that is moderate at L4-5 and
mild-to-moderate at L3-4.
3. Mild marrow and fat edema around an S1 osteophyte, which may be
symptomatic.

## 2020-03-15 ENCOUNTER — Other Ambulatory Visit: Payer: Self-pay

## 2020-03-15 ENCOUNTER — Ambulatory Visit: Payer: Self-pay

## 2020-03-15 ENCOUNTER — Ambulatory Visit (INDEPENDENT_AMBULATORY_CARE_PROVIDER_SITE_OTHER): Payer: Medicare PPO | Admitting: Physical Medicine and Rehabilitation

## 2020-03-15 ENCOUNTER — Encounter: Payer: Self-pay | Admitting: Physical Medicine and Rehabilitation

## 2020-03-15 VITALS — BP 145/94 | HR 67

## 2020-03-15 DIAGNOSIS — M5416 Radiculopathy, lumbar region: Secondary | ICD-10-CM | POA: Diagnosis not present

## 2020-03-15 DIAGNOSIS — M48062 Spinal stenosis, lumbar region with neurogenic claudication: Secondary | ICD-10-CM | POA: Diagnosis not present

## 2020-03-15 MED ORDER — METHYLPREDNISOLONE ACETATE 80 MG/ML IJ SUSP
40.0000 mg | Freq: Once | INTRAMUSCULAR | Status: AC
  Administered 2020-03-15: 40 mg

## 2020-03-15 NOTE — Procedures (Signed)
Lumbar Epidural Steroid Injection - Interlaminar Approach with Fluoroscopic Guidance  Patient: Seth [name]      Date of Birth: [birth year] MRN: [id number] PCP: Benita Stabile, MD      Visit Date: 03/15/2020   Universal Protocol:     Consent Given By: the patient  Position: PRONE  Additional Comments: Vital signs were monitored before and after the procedure. Patient was prepped and draped in the usual sterile fashion. The correct patient, procedure, and site was verified.   Injection Procedure Details:  Procedure Site One Meds Administered:  Meds ordered this encounter  Medications  . methylPREDNISolone acetate (DEPO-MEDROL) injection 40 mg     Laterality: Left  Location/Site:  L4-L5  Needle size: 20 G  Needle type: Tuohy  Needle Placement: Paramedian epidural  Findings:   -Comments: Excellent flow of contrast into the epidural space.  Procedure Details: Using a paramedian approach from the side mentioned above, the region overlying the inferior lamina was localized under fluoroscopic visualization and the soft tissues overlying this structure were infiltrated with 4 ml. of 1% Lidocaine without Epinephrine. The Tuohy needle was inserted into the epidural space using a paramedian approach.   The epidural space was localized using loss of resistance along with lateral and bi-planar fluoroscopic views.  After negative aspirate for air, blood, and CSF, a 2 ml. volume of Isovue-250 was injected into the epidural space and the flow of contrast was observed. Radiographs were obtained for documentation purposes.    The injectate was administered into the level noted above.   Additional Comments:  The patient tolerated the procedure well Dressing: 2 x 2 sterile gauze and Band-Aid    Post-procedure details: Patient was observed during the procedure. Post-procedure instructions were reviewed.  Patient left the clinic in stable condition.

## 2020-03-15 NOTE — Progress Notes (Signed)
Pt states pain across the lowe back that radiates in both legs at times. Pt states last injection 05/06/2019 helped out a lot and lasted for awhile. Walking makes pain worse. Injections helps with pain.   .Numeric Pain Rating Scale and Functional Assessment Average Pain 8   In the last MONTH (on 0-10 scale) has pain interfered with the following?  1. General activity like being  able to carry out your everyday physical activities such as walking, climbing stairs, carrying groceries, or moving a chair?  Rating(8)   +Driver, -BT, -Dye Allergies.

## 2020-03-15 NOTE — Progress Notes (Signed)
Unk Lightning - 64 y.o. male MRN [id number]  Date of birth: 27-Sep-1956  Office Visit Note: Visit Date: 03/15/2020 PCP: Celene Squibb, MD Referred by: Celene Squibb, MD  Subjective: Chief Complaint  Patient presents with  . Lower Back - Pain  . Right Leg - Pain  . Left Leg - Pain   HPI: Seth [name] is a 64 y.o. male who comes in today for planned repeat left L4-L5 lumbar epidural steroid injection with fluoroscopic guidance.  The patient has failed conservative care including home exercise, medications, time and activity modification.  This injection will be diagnostic and hopefully therapeutic.  Please see requesting physician notes for further details and justification.  Patient typically followed by Dr. Anderson Malta from an orthopedic standpoint.  I last saw the patient about 10 months ago in August 2020 and completed left L4-5 interlaminar dural steroid injection with good relief up until just recently over the last several months.  He has had worsening low back pain and bilateral leg pain left more than right into the posterior lateral thighs more of an L5 distribution.  He does have moderate multifactorial stenosis at L4-5 with listhesis and facet arthropathy.  He has reported some weight loss since we have last seen him but he still trying to lose weight as he does need a knee replacement.  He said no focal weakness no bowel bladder changes no red flag complaints   ROS Otherwise per HPI.  Assessment & Plan: Visit Diagnoses:  1. Lumbar radiculopathy   2. Spinal stenosis of lumbar region with neurogenic claudication     Plan: No additional findings.   Meds & Orders:  Meds ordered this encounter  Medications  . methylPREDNISolone acetate (DEPO-MEDROL) injection 40 mg    Orders Placed This Encounter  Procedures  . XR C-ARM NO REPORT  . Epidural Steroid injection    Follow-up: Return if symptoms worsen or fail to improve.   Procedures: No procedures performed  Lumbar  Epidural Steroid Injection - Interlaminar Approach with Fluoroscopic Guidance  Patient: Seth [name]      Date of Birth: 1956-03-18 MRN: [id number] PCP: Celene Squibb, MD      Visit Date: 03/15/2020   Universal Protocol:     Consent Given By: the patient  Position: PRONE  Additional Comments: Vital signs were monitored before and after the procedure. Patient was prepped and draped in the usual sterile fashion. The correct patient, procedure, and site was verified.   Injection Procedure Details:  Procedure Site One Meds Administered:  Meds ordered this encounter  Medications  . methylPREDNISolone acetate (DEPO-MEDROL) injection 40 mg     Laterality: Left  Location/Site:  L4-L5  Needle size: 20 G  Needle type: Tuohy  Needle Placement: Paramedian epidural  Findings:   -Comments: Excellent flow of contrast into the epidural space.  Procedure Details: Using a paramedian approach from the side mentioned above, the region overlying the inferior lamina was localized under fluoroscopic visualization and the soft tissues overlying this structure were infiltrated with 4 ml. of 1% Lidocaine without Epinephrine. The Tuohy needle was inserted into the epidural space using a paramedian approach.   The epidural space was localized using loss of resistance along with lateral and bi-planar fluoroscopic views.  After negative aspirate for air, blood, and CSF, a 2 ml. volume of Isovue-250 was injected into the epidural space and the flow of contrast was observed. Radiographs were obtained for documentation purposes.    The  injectate was administered into the level noted above.   Additional Comments:  The patient tolerated the procedure well Dressing: 2 x 2 sterile gauze and Band-Aid    Post-procedure details: Patient was observed during the procedure. Post-procedure instructions were reviewed.  Patient left the clinic in stable condition.    Clinical History: MRI LUMBAR  SPINE WITHOUT CONTRAST  TECHNIQUE: Multiplanar, multisequence MR imaging of the lumbar spine was performed. No intravenous contrast was administered.  COMPARISON:  Abdominal CT 08/08/2017  FINDINGS: Segmentation:  Standard lumbar numbering  Alignment:  Grade 1 anterolisthesis at L4-5  Vertebrae: Mild marrow edema in the anterior superior corner of S1 with mild adjacent prevertebral fat edema-new. No fracture or discitis.  Conus medullaris and cauda equina: Conus extends to the T12-L1 level. Conus and cauda equina appear normal.  Paraspinal and other soft tissues: Negative  Disc levels:  T12- L1: Spondylosis and mild facet spurring.  No impingement  L1-L2: Facet spurring.  Minor annulus bulging.  No impingement  L2-L3: Mild facet spurring and annulus bulging.  No impingement  L3-L4: Facet spurring and epidural fat effacement causes mild-to-moderate thecal sac narrowing. Patent foramina  L4-L5: Advanced facet arthropathy with spurring and anterolisthesis. Mild bulging of the uncovered disc. Bilateral foraminal narrowing which is mild. Moderate thecal sac narrowing.  L5-S1:Bilateral facet spurring which is bulky. Spondylosis with anterior spurring as described above. Epidural fat expansion without complete thecal sac effacement.  IMPRESSION: 1. Prominent facet osteoarthritis especially at L4-5 where there is grade 1 anterolisthesis. 2. Thecal sac narrowing that is moderate at L4-5 and mild-to-moderate at L3-4. 3. Mild marrow and fat edema around an S1 osteophyte, which may be symptomatic.   Electronically Signed   By: Monte Fantasia M.D.   On: 04/24/2019 09:35   He reports that he has never smoked. He has never used smokeless tobacco. No results for input(s): HGBA1C, LABURIC in the last 8760 hours.  Objective:  VS:  HT:    WT:   BMI:     BP:(!) 145/94  HR:67bpm  TEMP: ( )  RESP:  Physical Exam Constitutional:      General: He is not in  acute distress.    Appearance: Normal appearance. He is obese. He is not ill-appearing.  HENT:     Head: Normocephalic and atraumatic.     Right Ear: External ear normal.     Left Ear: External ear normal.  Eyes:     Extraocular Movements: Extraocular movements intact.  Cardiovascular:     Rate and Rhythm: Normal rate.     Pulses: Normal pulses.  Abdominal:     General: There is no distension.     Palpations: Abdomen is soft.  Musculoskeletal:        General: No tenderness or signs of injury.     Right lower leg: No edema.     Left lower leg: No edema.     Comments: Patient has good distal strength without clonus.  Skin:    Findings: No erythema or rash.  Neurological:     General: No focal deficit present.     Mental Status: He is alert and oriented to person, place, and time.     Sensory: No sensory deficit.     Motor: No weakness or abnormal muscle tone.     Coordination: Coordination normal.  Psychiatric:        Mood and Affect: Mood normal.        Behavior: Behavior normal.     Ortho Exam  Imaging: XR C-ARM NO REPORT  Result Date: 03/15/2020 Please see Notes tab for imaging impression.   Past Medical/Family/Surgical/Social History: Medications & Allergies reviewed per EMR, new medications updated. There are no problems to display for this patient.  Past Medical History:  Diagnosis Date  . Anxiety   . Depression   . Diabetes mellitus without complication (HCC)    history of, no medications now after Bariatric surgery.  Marland Kitchen History of kidney stones   . Hypertension   . Osteoarthritis of both knees   . Sleep apnea    dx. at one time, then they said no- mild, no cpap  . Transient ischemic attack (TIA)    History reviewed. No pertinent family history. Past Surgical History:  Procedure Laterality Date  . ACHILLES TENDON SURGERY Right 1994  . [place] SURGERY     2005  . cataracts Bilateral 2008  . CHOLECYSTECTOMY    . EXTRACORPOREAL SHOCK WAVE  LITHOTRIPSY Left 06/13/2018   Procedure: LEFT EXTRACORPOREAL SHOCK WAVE LITHOTRIPSY (ESWL) WITH MAC;  Surgeon: Irine Seal, MD;  Location: [facility];  Service: Urology;  Laterality: Left;  . repair of wrist fx Right   . TONSILLECTOMY     Social History   Occupational History  . Not on file  Tobacco Use  . Smoking status: Never Smoker  . Smokeless tobacco: Never Used  Vaping Use  . Vaping Use: Never used  Substance and Sexual Activity  . Alcohol use: Not Currently  . Drug use: Never  . Sexual activity: Not on file

## 2020-04-13 DIAGNOSIS — L209 Atopic dermatitis, unspecified: Secondary | ICD-10-CM | POA: Diagnosis not present

## 2020-04-13 DIAGNOSIS — R300 Dysuria: Secondary | ICD-10-CM | POA: Diagnosis not present

## 2020-04-13 DIAGNOSIS — Z6841 Body Mass Index (BMI) 40.0 and over, adult: Secondary | ICD-10-CM | POA: Diagnosis not present

## 2020-04-13 DIAGNOSIS — R319 Hematuria, unspecified: Secondary | ICD-10-CM | POA: Diagnosis not present

## 2020-04-13 DIAGNOSIS — M545 Low back pain: Secondary | ICD-10-CM | POA: Diagnosis not present

## 2020-04-13 DIAGNOSIS — Z0001 Encounter for general adult medical examination with abnormal findings: Secondary | ICD-10-CM | POA: Diagnosis not present

## 2020-04-13 DIAGNOSIS — N529 Male erectile dysfunction, unspecified: Secondary | ICD-10-CM | POA: Diagnosis not present

## 2020-04-13 DIAGNOSIS — N3001 Acute cystitis with hematuria: Secondary | ICD-10-CM | POA: Diagnosis not present

## 2020-04-13 DIAGNOSIS — I10 Essential (primary) hypertension: Secondary | ICD-10-CM | POA: Diagnosis not present

## 2020-04-15 DIAGNOSIS — E782 Mixed hyperlipidemia: Secondary | ICD-10-CM | POA: Diagnosis not present

## 2020-04-15 DIAGNOSIS — G8929 Other chronic pain: Secondary | ICD-10-CM | POA: Diagnosis not present

## 2020-04-15 DIAGNOSIS — M170 Bilateral primary osteoarthritis of knee: Secondary | ICD-10-CM | POA: Diagnosis not present

## 2020-04-15 DIAGNOSIS — R7303 Prediabetes: Secondary | ICD-10-CM | POA: Diagnosis not present

## 2020-04-15 DIAGNOSIS — F419 Anxiety disorder, unspecified: Secondary | ICD-10-CM | POA: Diagnosis not present

## 2020-04-15 DIAGNOSIS — M25569 Pain in unspecified knee: Secondary | ICD-10-CM | POA: Diagnosis not present

## 2020-04-15 DIAGNOSIS — M545 Low back pain: Secondary | ICD-10-CM | POA: Diagnosis not present

## 2020-04-15 DIAGNOSIS — I10 Essential (primary) hypertension: Secondary | ICD-10-CM | POA: Diagnosis not present

## 2020-04-15 DIAGNOSIS — M5430 Sciatica, unspecified side: Secondary | ICD-10-CM | POA: Diagnosis not present

## 2020-06-03 ENCOUNTER — Telehealth: Payer: Self-pay | Admitting: Physical Medicine and Rehabilitation

## 2020-06-03 NOTE — Telephone Encounter (Signed)
Patient requested an OV with Dr. Alvester Morin after Zoom seminar. Left message #1 to schedule OV.

## 2020-06-18 NOTE — Telephone Encounter (Signed)
Per Enrique Sack, patient had a death in the family and will call me back when he can.

## 2020-06-22 ENCOUNTER — Telehealth: Payer: Self-pay | Admitting: Physical Medicine and Rehabilitation

## 2020-06-22 NOTE — Telephone Encounter (Signed)
Left message to schedule OV with Dr. Alvester Morin and to discuss knee injections with ortho.

## 2020-06-22 NOTE — Telephone Encounter (Signed)
Pt called stating he attended a webinar for alternative pain and he would like to get scheduled for that and pt also states he would also like injections for bilateral knees   [phone]

## 2020-06-22 NOTE — Telephone Encounter (Signed)
Scheduled for OV with Dr. Alvester Morin and with Dr. August Saucer on same day.

## 2020-07-01 ENCOUNTER — Encounter: Payer: Self-pay | Admitting: Internal Medicine

## 2020-07-12 DIAGNOSIS — F39 Unspecified mood [affective] disorder: Secondary | ICD-10-CM | POA: Diagnosis not present

## 2020-07-12 DIAGNOSIS — Z0001 Encounter for general adult medical examination with abnormal findings: Secondary | ICD-10-CM | POA: Diagnosis not present

## 2020-07-12 DIAGNOSIS — Z6841 Body Mass Index (BMI) 40.0 and over, adult: Secondary | ICD-10-CM | POA: Diagnosis not present

## 2020-07-12 DIAGNOSIS — R319 Hematuria, unspecified: Secondary | ICD-10-CM | POA: Diagnosis not present

## 2020-07-12 DIAGNOSIS — R300 Dysuria: Secondary | ICD-10-CM | POA: Diagnosis not present

## 2020-07-12 DIAGNOSIS — M5430 Sciatica, unspecified side: Secondary | ICD-10-CM | POA: Diagnosis not present

## 2020-07-12 DIAGNOSIS — E782 Mixed hyperlipidemia: Secondary | ICD-10-CM | POA: Diagnosis not present

## 2020-07-12 DIAGNOSIS — M25569 Pain in unspecified knee: Secondary | ICD-10-CM | POA: Diagnosis not present

## 2020-07-12 DIAGNOSIS — F419 Anxiety disorder, unspecified: Secondary | ICD-10-CM | POA: Diagnosis not present

## 2020-07-12 DIAGNOSIS — N529 Male erectile dysfunction, unspecified: Secondary | ICD-10-CM | POA: Diagnosis not present

## 2020-07-12 DIAGNOSIS — G8929 Other chronic pain: Secondary | ICD-10-CM | POA: Diagnosis not present

## 2020-07-12 DIAGNOSIS — R7303 Prediabetes: Secondary | ICD-10-CM | POA: Diagnosis not present

## 2020-07-12 DIAGNOSIS — M549 Dorsalgia, unspecified: Secondary | ICD-10-CM | POA: Diagnosis not present

## 2020-07-12 DIAGNOSIS — N3001 Acute cystitis with hematuria: Secondary | ICD-10-CM | POA: Diagnosis not present

## 2020-07-12 DIAGNOSIS — I10 Essential (primary) hypertension: Secondary | ICD-10-CM | POA: Diagnosis not present

## 2020-07-12 DIAGNOSIS — M170 Bilateral primary osteoarthritis of knee: Secondary | ICD-10-CM | POA: Diagnosis not present

## 2020-07-12 DIAGNOSIS — L209 Atopic dermatitis, unspecified: Secondary | ICD-10-CM | POA: Diagnosis not present

## 2020-07-14 ENCOUNTER — Encounter: Payer: Self-pay | Admitting: Orthopedic Surgery

## 2020-07-14 ENCOUNTER — Ambulatory Visit: Payer: Self-pay

## 2020-07-14 ENCOUNTER — Other Ambulatory Visit: Payer: Self-pay

## 2020-07-14 ENCOUNTER — Telehealth: Payer: Self-pay | Admitting: Physical Medicine and Rehabilitation

## 2020-07-14 ENCOUNTER — Encounter: Payer: Self-pay | Admitting: Physical Medicine and Rehabilitation

## 2020-07-14 ENCOUNTER — Ambulatory Visit: Payer: Medicare PPO | Admitting: Physical Medicine and Rehabilitation

## 2020-07-14 ENCOUNTER — Ambulatory Visit (INDEPENDENT_AMBULATORY_CARE_PROVIDER_SITE_OTHER): Payer: Medicare PPO | Admitting: Orthopedic Surgery

## 2020-07-14 VITALS — Ht 71.0 in | Wt 347.0 lb

## 2020-07-14 VITALS — BP 134/87 | HR 67

## 2020-07-14 DIAGNOSIS — M48062 Spinal stenosis, lumbar region with neurogenic claudication: Secondary | ICD-10-CM | POA: Diagnosis not present

## 2020-07-14 DIAGNOSIS — M5416 Radiculopathy, lumbar region: Secondary | ICD-10-CM

## 2020-07-14 DIAGNOSIS — M170 Bilateral primary osteoarthritis of knee: Secondary | ICD-10-CM | POA: Diagnosis not present

## 2020-07-14 DIAGNOSIS — M4316 Spondylolisthesis, lumbar region: Secondary | ICD-10-CM | POA: Diagnosis not present

## 2020-07-14 DIAGNOSIS — M1712 Unilateral primary osteoarthritis, left knee: Secondary | ICD-10-CM | POA: Diagnosis not present

## 2020-07-14 DIAGNOSIS — G894 Chronic pain syndrome: Secondary | ICD-10-CM | POA: Diagnosis not present

## 2020-07-14 DIAGNOSIS — M25561 Pain in right knee: Secondary | ICD-10-CM

## 2020-07-14 DIAGNOSIS — F1190 Opioid use, unspecified, uncomplicated: Secondary | ICD-10-CM

## 2020-07-14 DIAGNOSIS — M25562 Pain in left knee: Secondary | ICD-10-CM

## 2020-07-14 DIAGNOSIS — M47816 Spondylosis without myelopathy or radiculopathy, lumbar region: Secondary | ICD-10-CM | POA: Diagnosis not present

## 2020-07-14 MED ORDER — METHYLPREDNISOLONE ACETATE 40 MG/ML IJ SUSP
40.0000 mg | INTRAMUSCULAR | Status: AC | PRN
  Administered 2020-07-14: 40 mg via INTRA_ARTICULAR

## 2020-07-14 MED ORDER — BUPIVACAINE HCL 0.25 % IJ SOLN
4.0000 mL | INTRAMUSCULAR | Status: AC | PRN
  Administered 2020-07-14: 4 mL via INTRA_ARTICULAR

## 2020-07-14 MED ORDER — LIDOCAINE HCL 1 % IJ SOLN
5.0000 mL | INTRAMUSCULAR | Status: AC | PRN
  Administered 2020-07-14: 5 mL

## 2020-07-14 NOTE — Telephone Encounter (Signed)
Pt was approve Auth# [id number] .

## 2020-07-14 NOTE — Progress Notes (Signed)
Pt state lower back and knee pain. Pt state standing for long period of time. Pt state he has to hold on a shopping cart when shopping he has to stop and sit down to finish. Pt state has to sit to rest or laying down. Pt state no pain when he laying down.  Numeric Pain Rating Scale and Functional Assessment Average Pain 8   In the last MONTH (on 0-10 scale) has pain interfered with the following?  1. General activity like being  able to carry out your everyday physical activities such as walking, climbing stairs, carrying groceries, or moving a chair?  Rating(9)

## 2020-07-14 NOTE — Telephone Encounter (Signed)
Needs auth for [id number]- L5-S1 [place]. Patient is scheduled for 10/21 with driver and no blood thinners.

## 2020-07-14 NOTE — Progress Notes (Addendum)
Office Visit Note   Patient: Seth [name]           Date of Birth: August 15, 1956           MRN: [id number] Visit Date: 07/14/2020 Requested by: Celene Squibb, MD [id number] [place],  [place] [id number] PCP: Celene Squibb, MD  Subjective: Chief Complaint  Patient presents with  . Left Knee - Pain  . Right Knee - Pain    HPI: Seth [name] is a 64 year old patient is semiretired with bilateral knee pain.  Has been ambulating with a cane.  Would like to have injections done today.  Reports generally severe pain with ambulation along with pain with standing.  He is not able to get in [place] where he likes to walk.  He has less than 1 block of walking endurance at this time.  He is being treated by Dr. Ernestina Patches for back issues possibly with spinal cord stimulator.  Has difficulty walking on uneven ground              ROS: All systems reviewed are negative as they relate to the chief complaint within the history of present illness.  Patient denies  fevers or chills.   Assessment & Plan: Visit Diagnoses:  1. Pain in both knees, unspecified chronicity     Plan: Impression is bilateral end-stage knee arthritis.  Plan is bilateral knee cortisone injections today with preapproval for Synvisc.  BMI 48.5.  Would need to go from weight of 3 47-2 85 to get under the BMI of 40 which his insurance requires for elective joint replacement.  In the meantime we will preapproved him for gel injection which we can inject at a time when the cortisone shots wear off.  Follow-up in 3 months for repeat clinical check on weight loss  This patient is diagnosed with osteoarthritis of the knee(s).    Radiographs show evidence of joint space narrowing, osteophytes, subchondral sclerosis and/or subchondral cysts.  This patient has knee pain which interferes with functional and activities of daily living.    This patient has experienced inadequate response, adverse effects and/or intolerance with conservative treatments  such as acetaminophen, NSAIDS, topical creams, physical therapy or regular exercise, knee bracing and/or weight loss.   This patient has experienced inadequate response or has a contraindication to intra articular steroid injections for at least 3 months.   This patient is not scheduled to have a total knee replacement within 6 months of starting treatment with viscosupplementation. .  Follow-Up Instructions: Return in about 3 months (around 10/14/2020).   Orders:  Orders Placed This Encounter  Procedures  . XR KNEE 3 VIEW RIGHT  . XR Knee 1-2 Views Left   No orders of the defined types were placed in this encounter.     Procedures: Large Joint Inj: bilateral knee on 07/14/2020 12:00 PM Indications: diagnostic evaluation, joint swelling and pain Details: 18 G 1.5 in needle, superolateral approach  Arthrogram: No  Medications (Right): 5 mL lidocaine 1 %; 4 mL bupivacaine 0.25 %; 40 mg methylPREDNISolone acetate 40 MG/ML Medications (Left): 5 mL lidocaine 1 %; 4 mL bupivacaine 0.25 %; 40 mg methylPREDNISolone acetate 40 MG/ML Outcome: tolerated well, no immediate complications Procedure, treatment alternatives, risks and benefits explained, specific risks discussed. Consent was given by the patient. Immediately prior to procedure a time out was called to verify the correct patient, procedure, equipment, support staff and site/side marked as required. Patient was prepped and draped in the  usual sterile fashion.       Clinical Data: No additional findings.  Objective: Vital Signs: Ht _0  (1.803 m)   Wt (!) 347 lb (157.4 kg)   BMI 48.40 kg/m   Physical Exam:   Constitutional: Patient appears well-developed HEENT:  Head: Normocephalic Eyes:EOM are normal Neck: Normal range of motion Cardiovascular: Normal rate Pulmonary/chest: Effort normal Neurologic: Patient is alert Skin: Skin is warm Psychiatric: Patient has normal mood and affect    Ortho Exam: Ortho exam  demonstrates intact ankle dorsiflexion with perfused feet.  Pedal pulses intact.  No groin pain with internal extra rotation of the leg.  Does have medial greater than lateral joint line tenderness with left knee more painful than the right.  Extensor mechanism is intact.  Hip flexion strength intact.  No other masses lymphadenopathy or skin changes noted in bilateral knee region.  Specialty Comments:  No specialty comments available.  Imaging: XR Knee 1-2 Views Left  Result Date: 07/14/2020 AP lateral merchant left knee reviewed.  Alignment slight varus.  End-stage tricompartmental arthritis is present much worse in the medial compartment with bone-on-bone changes.  No acute fracture or loose bodies.  XR KNEE 3 VIEW RIGHT  Result Date: 07/14/2020 AP lateral merchant right knee reviewed.  Mild varus alignment present.  No acute fracture.  End-stage tricompartmental arthritis is present much worse in the medial compartment with bone-on-bone changes.    PMFS History: There are no problems to display for this patient.  Past Medical History:  Diagnosis Date  . Anxiety   . Depression   . Diabetes mellitus without complication (HCC)    history of, no medications now after Bariatric surgery.  Marland Kitchen History of kidney stones   . Hypertension   . Osteoarthritis of both knees   . Sleep apnea    dx. at one time, then they said no- mild, no cpap  . Transient ischemic attack (TIA)     History reviewed. No pertinent family history.  Past Surgical History:  Procedure Laterality Date  . ACHILLES TENDON SURGERY Right 1994  . [place] SURGERY     2005  . cataracts Bilateral 2008  . CHOLECYSTECTOMY    . EXTRACORPOREAL SHOCK WAVE LITHOTRIPSY Left 06/13/2018   Procedure: LEFT EXTRACORPOREAL SHOCK WAVE LITHOTRIPSY (ESWL) WITH MAC;  Surgeon: Irine Seal, MD;  Location: [facility];  Service: Urology;  Laterality: Left;  . repair of wrist fx Right   . TONSILLECTOMY     Social History   Occupational  History  . Not on file  Tobacco Use  . Smoking status: Never Smoker  . Smokeless tobacco: Never Used  Vaping Use  . Vaping Use: Never used  Substance and Sexual Activity  . Alcohol use: Not Currently  . Drug use: Never  . Sexual activity: Not on file

## 2020-07-15 ENCOUNTER — Encounter: Payer: Self-pay | Admitting: Physical Medicine and Rehabilitation

## 2020-07-15 DIAGNOSIS — M25569 Pain in unspecified knee: Secondary | ICD-10-CM | POA: Insufficient documentation

## 2020-07-15 NOTE — Progress Notes (Signed)
Unk Lightning - 63 y.o. male MRN [id number]  Date of birth: [birth year]  Office Visit Note: Visit Date: 07/14/2020 PCP: Celene Squibb, MD Referred by: Celene Squibb, MD  Subjective: Chief Complaint  Patient presents with  . Lower Back - Pain  . Left Knee - Pain  . Right Knee - Pain   HPI: Seth [name] is a 64 y.o. male who comes in today Evaluation and management of chronic recalcitrant mostly axial low back pain some referral at times into the right lower leg intermittently.  His history with [place] is well-documented.  He is followed from an orthopedic standpoint by Dr. Anderson Malta in the office.  He does in fact have an appointment today with him for his left knee.  Patient does complain of left knee pain with end-stage arthritis of the left knee.  He ambulates with a cane.  Has history of prior stroke.  His case is very complicated with morbid obesity as well as chronic pain syndrome with longstanding opioid usage managed by his primary care physician Dr. Nevada Crane.  He does continue to take Celebrex as well as oxycodone and gabapentin.  He does use Voltaren gel for his knees.  We have seen the patient on a couple of occasions for epidural injection which did relieve his symptoms more than 50% but it was very short-lived with 2 to 3 months of relief at a time.  Historically he has had this in the past with very similar outcome.  His pain is worse with prolonged standing for any length of time he does find that if he holds onto a shopping cart he can walk further at the store.  He rates his pain as an 8 out of 10 despite medication management and history of prior injection and therapy etc.  He has had multiple bouts of physical therapy over the years and tries to stay active.  It really does limit his daily activities.  He denies any focal weakness but says his legs feel somewhat weak when he walks.  He has had MRI of the lumbar spine which is reviewed again with him today and reviewed below in the  notes.  This mainly shows facet arthritis pretty significant L4-5 with listhesis and moderate stenosis.  No high-grade nerve compression.  Do to his comorbidities he has not been deemed a great surgical candidate in the past.  He really does not want surgery.  He did see a presentation that we had made on advanced pain management techniques including spinal cord stimulator trials and we did discuss that at length today for over 30 minutes just by itself.  He has had materials to review for that etc.  Review of Systems  Musculoskeletal: Positive for back pain and joint pain.  All other systems reviewed and are negative.  Otherwise per HPI.  Assessment & Plan: Visit Diagnoses:  1. Spinal stenosis of lumbar region with neurogenic claudication   2. Spondylosis without myelopathy or radiculopathy, lumbar region   3. Spondylolisthesis of lumbar region   4. Lumbar radiculopathy   5. Chronic pain syndrome   6. Opioid use, unspecified, uncomplicated   7. Unilateral primary osteoarthritis, left knee   8. Morbid obesity ([place])     Plan: Findings:  Chronic recalcitrant back pain with some referral in the radicular nature intermittently but has not been relieved to any great degree with medication management including opioid medicines for quite some time as well as adjunct of medication and anti-inflammatory  and history of physical therapy off and on and activity modification.  Epidural injections have been beneficial for 2 to 3 months at a time.  I did talk to the patient about the fact that intermittent epidural injection is something that we can do safely and if it were helping for 3 to 4 months even if it was good relief it may be worthwhile.  I do want a set him up for repeat epidural injection he has done well with that.  This is all been done in the setting of a comprehensive pain management approach.  There is access to pain psychology if needed but patient does have history of depression anxiety which  is already treated.  He will continue on current medications.  I do think he is a reasonable candidate for spinal cord stimulation and since that is probably a last chance procedure to get him longer term relief.  It is complicated to do the procedure with his body habitus but I think it can be done.  The first step in trying to look at approval process for a stimulator would be to have a pain psychology or neuropsychology pretrial evaluation.  We will get this scheduled.  He is seeing Dr. Marlou Sa for his left knee today.  Stimulator probably would not help his knee arthritis at all and he did question that.    Meds & Orders: No orders of the defined types were placed in this encounter.  No orders of the defined types were placed in this encounter.   Follow-up: Return for Repeat epidural injection.   Procedures: No procedures performed  No notes on file   Clinical History: MRI LUMBAR SPINE WITHOUT CONTRAST  TECHNIQUE: Multiplanar, multisequence MR imaging of the lumbar spine was performed. No intravenous contrast was administered.  COMPARISON:  Abdominal CT 08/08/2017  FINDINGS: Segmentation:  Standard lumbar numbering  Alignment:  Grade 1 anterolisthesis at L4-5  Vertebrae: Mild marrow edema in the anterior superior corner of S1 with mild adjacent prevertebral fat edema-new. No fracture or discitis.  Conus medullaris and cauda equina: Conus extends to the T12-L1 level. Conus and cauda equina appear normal.  Paraspinal and other soft tissues: Negative  Disc levels:  T12- L1: Spondylosis and mild facet spurring.  No impingement  L1-L2: Facet spurring.  Minor annulus bulging.  No impingement  L2-L3: Mild facet spurring and annulus bulging.  No impingement  L3-L4: Facet spurring and epidural fat effacement causes mild-to-moderate thecal sac narrowing. Patent foramina  L4-L5: Advanced facet arthropathy with spurring and anterolisthesis. Mild bulging of the  uncovered disc. Bilateral foraminal narrowing which is mild. Moderate thecal sac narrowing.  L5-S1:Bilateral facet spurring which is bulky. Spondylosis with anterior spurring as described above. Epidural fat expansion without complete thecal sac effacement.  IMPRESSION: 1. Prominent facet osteoarthritis especially at L4-5 where there is grade 1 anterolisthesis. 2. Thecal sac narrowing that is moderate at L4-5 and mild-to-moderate at L3-4. 3. Mild marrow and fat edema around an S1 osteophyte, which may be symptomatic.   Electronically Signed   By: Monte Fantasia M.D.   On: 04/24/2019 09:35   He reports that he has never smoked. He has never used smokeless tobacco. No results for input(s): HGBA1C, LABURIC in the last 8760 hours.  Objective:  VS:  HT:    WT:   BMI:     BP:134/87  HR:67bpm  TEMP: ( )  RESP:  Physical Exam Constitutional:      General: He is not in acute distress.  Appearance: Normal appearance. He is obese. He is not ill-appearing.  HENT:     Head: Normocephalic and atraumatic.     Right Ear: External ear normal.     Left Ear: External ear normal.  Eyes:     Extraocular Movements: Extraocular movements intact.  Cardiovascular:     Rate and Rhythm: Normal rate.     Pulses: Normal pulses.  Abdominal:     General: There is no distension.     Palpations: Abdomen is soft.  Musculoskeletal:        General: No tenderness or signs of injury.     Right lower leg: Edema present.     Left lower leg: Edema present.     Comments: Patient has good distal strength without clonus.  He does have varus deformity mild of the left knee.  He does ambulate with an antalgic gait to the left using a cane.  He is very slow to rise from a seated position to full extension he does have pain with facet loading.  He has a negative slump test bilaterally.  No pain with hip rotation.  Skin:    Findings: No erythema or rash.  Neurological:     General: No focal deficit  present.     Mental Status: He is alert and oriented to person, place, and time.     Sensory: No sensory deficit.     Motor: No weakness or abnormal muscle tone.     Coordination: Coordination normal.     Gait: Gait abnormal.  Psychiatric:        Mood and Affect: Mood normal.        Behavior: Behavior normal.     Ortho Exam  Imaging: XR Knee 1-2 Views Left  Result Date: 07/14/2020 AP lateral merchant left knee reviewed.  Alignment slight varus.  End-stage tricompartmental arthritis is present much worse in the medial compartment with bone-on-bone changes.  No acute fracture or loose bodies.  XR KNEE 3 VIEW RIGHT  Result Date: 07/14/2020 AP lateral merchant right knee reviewed.  Mild varus alignment present.  No acute fracture.  End-stage tricompartmental arthritis is present much worse in the medial compartment with bone-on-bone changes.   Past Medical/Family/Surgical/Social History: Medications & Allergies reviewed per EMR, new medications updated. Patient Active Problem List   Diagnosis Date Noted  . Knee pain 07/15/2020  . Sciatica of right side 04/19/2017  . TIA (transient ischemic attack) 04/19/2017  . Adjustment disorder with anxious mood 03/23/2016  . Opioid type dependence, continuous ([place]) 12/08/2015  . Seizure prophylaxis 10/13/2015  . Allergic rhinitis 12/17/2014  . Morbid obesity ([place]) 05/06/2014  . Anxiety 07/17/2012  . Depression 07/17/2012  . Essential hypertension 07/17/2012  . Intraparenchymal hemorrhage of brain ([place]) 07/16/2012   Past Medical History:  Diagnosis Date  . Anxiety   . Depression   . Diabetes mellitus without complication (HCC)    history of, no medications now after Bariatric surgery.  Marland Kitchen History of kidney stones   . Hypertension   . Osteoarthritis of both knees   . Sleep apnea    dx. at one time, then they said no- mild, no cpap  . Transient ischemic attack (TIA)    History reviewed. No pertinent family history. Past Surgical  History:  Procedure Laterality Date  . ACHILLES TENDON SURGERY Right 1994  . [place] SURGERY     2005  . cataracts Bilateral 2008  . CHOLECYSTECTOMY    . EXTRACORPOREAL SHOCK WAVE LITHOTRIPSY Left 06/13/2018  Procedure: LEFT EXTRACORPOREAL SHOCK WAVE LITHOTRIPSY (ESWL) WITH MAC;  Surgeon: Irine Seal, MD;  Location: [facility];  Service: Urology;  Laterality: Left;  . repair of wrist fx Right   . TONSILLECTOMY     Social History   Occupational History  . Not on file  Tobacco Use  . Smoking status: Never Smoker  . Smokeless tobacco: Never Used  Vaping Use  . Vaping Use: Never used  Substance and Sexual Activity  . Alcohol use: Not Currently  . Drug use: Never  . Sexual activity: Not on file

## 2020-07-16 ENCOUNTER — Telehealth: Payer: Self-pay

## 2020-07-16 NOTE — Telephone Encounter (Signed)
Noted  

## 2020-07-16 NOTE — Telephone Encounter (Signed)
-----   Message from Prescott Parma, RT sent at 07/14/2020 12:48 PM EDT ----- See below ----- Message ----- From: Cammy Copa, MD Sent: 07/14/2020  12:11 PM EDT To: Prescott Parma, RT  Hi Lauren can you preapproved him for Synvisc thank you bilateral knees

## 2020-07-22 ENCOUNTER — Other Ambulatory Visit: Payer: Self-pay

## 2020-07-22 ENCOUNTER — Encounter: Payer: Self-pay | Admitting: Physical Medicine and Rehabilitation

## 2020-07-22 ENCOUNTER — Ambulatory Visit (INDEPENDENT_AMBULATORY_CARE_PROVIDER_SITE_OTHER): Payer: Medicare PPO | Admitting: Physical Medicine and Rehabilitation

## 2020-07-22 ENCOUNTER — Ambulatory Visit: Payer: Self-pay

## 2020-07-22 VITALS — BP 115/73 | HR 67

## 2020-07-22 DIAGNOSIS — M5416 Radiculopathy, lumbar region: Secondary | ICD-10-CM

## 2020-07-22 MED ORDER — METHYLPREDNISOLONE ACETATE 80 MG/ML IJ SUSP
80.0000 mg | Freq: Once | INTRAMUSCULAR | Status: AC
  Administered 2020-07-22: 80 mg

## 2020-07-22 NOTE — Progress Notes (Signed)
Pt state lower pain that travels down both leg to the knees. Pt state standing and walking for a long time makes the pain worse. Pt state he lend on objects to ease some pain. Pt state he lays down and take pain meds to helps ease the pain.  Numeric Pain Rating Scale and Functional Assessment Average Pain 4   In the last MONTH (on 0-10 scale) has pain interfered with the following?  1. General activity like being  able to carry out your everyday physical activities such as walking, climbing stairs, carrying groceries, or moving a chair?  Rating(8)   +Driver, -BT, -Dye Allergies.

## 2020-07-26 DIAGNOSIS — G894 Chronic pain syndrome: Secondary | ICD-10-CM | POA: Diagnosis not present

## 2020-08-02 ENCOUNTER — Telehealth: Payer: Self-pay

## 2020-08-02 NOTE — Telephone Encounter (Signed)
Submitted VOB, Monovisc, bilateral knee. 

## 2020-08-29 NOTE — Progress Notes (Signed)
Seth [name] - 64 y.o. male MRN [id number]  Date of birth: 1956/07/29  Office Visit Note: Visit Date: 07/22/2020 PCP: Benita Stabile, MD Referred by: Benita Stabile, MD  Subjective: Chief Complaint  Patient presents with  . Lower Back - Pain  . Right Hip - Pain  . Left Hip - Pain  . Left Leg - Pain  . Right Leg - Pain   HPI:  Seth [name] is a 64 y.o. male who comes in today for planned repeat Left L4-L5 Lumbar epidural steroid injection with fluoroscopic guidance.  The patient has failed conservative care including home exercise, medications, time and activity modification.  This injection will be diagnostic and hopefully therapeutic.  Please see requesting physician notes for further details and justification. Patient received more than 50% pain relief from prior injection.   Referring: Dr. Naaman Plummer, Burnard Bunting, MD   ROS Otherwise per HPI.  Assessment & Plan: Visit Diagnoses:  1. Lumbar radiculopathy     Plan: No additional findings.   Meds & Orders:  Meds ordered this encounter  Medications  . methylPREDNISolone acetate (DEPO-MEDROL) injection 80 mg    Orders Placed This Encounter  Procedures  . XR C-ARM NO REPORT  . Epidural Steroid injection    Follow-up: No follow-ups on file.   Procedures: No procedures performed  Lumbar Epidural Steroid Injection - Interlaminar Approach with Fluoroscopic Guidance  Patient: Seth [name]      Date of Birth: 1956-02-07 MRN: [id number] PCP: Benita Stabile, MD      Visit Date: 07/22/2020   Universal Protocol:     Consent Given By: the patient  Position: PRONE  Additional Comments: Vital signs were monitored before and after the procedure. Patient was prepped and draped in the usual sterile fashion. The correct patient, procedure, and site was verified.   Injection Procedure Details:   Procedure diagnoses:  1. Lumbar radiculopathy      Meds Administered:  Meds ordered this encounter  Medications  .  methylPREDNISolone acetate (DEPO-MEDROL) injection 80 mg     Laterality: Left  Location/Site:  L4-L5  Needle: 4.5 in., 20 ga. Tuohy  Needle Placement: Paramedian epidural  Findings:   -Comments: Excellent flow of contrast into the epidural space.  Procedure Details: Using a paramedian approach from the side mentioned above, the region overlying the inferior lamina was localized under fluoroscopic visualization and the soft tissues overlying this structure were infiltrated with 4 ml. of 1% Lidocaine without Epinephrine. The Tuohy needle was inserted into the epidural space using a paramedian approach.   The epidural space was localized using loss of resistance along with counter oblique bi-planar fluoroscopic views.  After negative aspirate for air, blood, and CSF, a 2 ml. volume of Isovue-250 was injected into the epidural space and the flow of contrast was observed. Radiographs were obtained for documentation purposes.    The injectate was administered into the level noted above.   Additional Comments:  The patient tolerated the procedure well Dressing: 2 x 2 sterile gauze and Band-Aid    Post-procedure details: Patient was observed during the procedure. Post-procedure instructions were reviewed.  Patient left the clinic in stable condition.    Clinical History: MRI LUMBAR SPINE WITHOUT CONTRAST  TECHNIQUE: Multiplanar, multisequence MR imaging of the lumbar spine was performed. No intravenous contrast was administered.  COMPARISON:  Abdominal CT 08/08/2017  FINDINGS: Segmentation:  Standard lumbar numbering  Alignment:  Grade 1 anterolisthesis at L4-5  Vertebrae: Mild marrow  edema in the anterior superior corner of S1 with mild adjacent prevertebral fat edema-new. No fracture or discitis.  Conus medullaris and cauda equina: Conus extends to the T12-L1 level. Conus and cauda equina appear normal.  Paraspinal and other soft tissues: Negative  Disc  levels:  T12- L1: Spondylosis and mild facet spurring.  No impingement  L1-L2: Facet spurring.  Minor annulus bulging.  No impingement  L2-L3: Mild facet spurring and annulus bulging.  No impingement  L3-L4: Facet spurring and epidural fat effacement causes mild-to-moderate thecal sac narrowing. Patent foramina  L4-L5: Advanced facet arthropathy with spurring and anterolisthesis. Mild bulging of the uncovered disc. Bilateral foraminal narrowing which is mild. Moderate thecal sac narrowing.  L5-S1:Bilateral facet spurring which is bulky. Spondylosis with anterior spurring as described above. Epidural fat expansion without complete thecal sac effacement.  IMPRESSION: 1. Prominent facet osteoarthritis especially at L4-5 where there is grade 1 anterolisthesis. 2. Thecal sac narrowing that is moderate at L4-5 and mild-to-moderate at L3-4. 3. Mild marrow and fat edema around an S1 osteophyte, which may be symptomatic.   Electronically Signed   By: Marnee Spring M.D.   On: 04/24/2019 09:35     Objective:  VS:  HT:    WT:   BMI:     BP:115/73  HR:67bpm  TEMP: ( )  RESP:  Physical Exam Constitutional:      General: He is not in acute distress.    Appearance: Normal appearance. He is obese. He is not ill-appearing.  HENT:     Head: Normocephalic and atraumatic.     Right Ear: External ear normal.     Left Ear: External ear normal.  Eyes:     Extraocular Movements: Extraocular movements intact.  Cardiovascular:     Rate and Rhythm: Normal rate.     Pulses: Normal pulses.  Abdominal:     General: There is no distension.     Palpations: Abdomen is soft.  Musculoskeletal:        General: No tenderness or signs of injury.     Right lower leg: No edema.     Left lower leg: No edema.     Comments: Patient has good distal strength without clonus.  Skin:    Findings: No erythema or rash.  Neurological:     General: No focal deficit present.     Mental Status:  He is alert and oriented to person, place, and time.     Sensory: No sensory deficit.     Motor: No weakness or abnormal muscle tone.     Coordination: Coordination normal.  Psychiatric:        Mood and Affect: Mood normal.        Behavior: Behavior normal.      Imaging: No results found.

## 2020-08-29 NOTE — Procedures (Signed)
Lumbar Epidural Steroid Injection - Interlaminar Approach with Fluoroscopic Guidance  Patient: Seth [name]      Date of Birth: Apr 23, 1956 MRN: [id number] PCP: Benita Stabile, MD      Visit Date: 07/22/2020   Universal Protocol:     Consent Given By: the patient  Position: PRONE  Additional Comments: Vital signs were monitored before and after the procedure. Patient was prepped and draped in the usual sterile fashion. The correct patient, procedure, and site was verified.   Injection Procedure Details:   Procedure diagnoses:  1. Lumbar radiculopathy      Meds Administered:  Meds ordered this encounter  Medications  . methylPREDNISolone acetate (DEPO-MEDROL) injection 80 mg     Laterality: Left  Location/Site:  L4-L5  Needle: 4.5 in., 20 ga. Tuohy  Needle Placement: Paramedian epidural  Findings:   -Comments: Excellent flow of contrast into the epidural space.  Procedure Details: Using a paramedian approach from the side mentioned above, the region overlying the inferior lamina was localized under fluoroscopic visualization and the soft tissues overlying this structure were infiltrated with 4 ml. of 1% Lidocaine without Epinephrine. The Tuohy needle was inserted into the epidural space using a paramedian approach.   The epidural space was localized using loss of resistance along with counter oblique bi-planar fluoroscopic views.  After negative aspirate for air, blood, and CSF, a 2 ml. volume of Isovue-250 was injected into the epidural space and the flow of contrast was observed. Radiographs were obtained for documentation purposes.    The injectate was administered into the level noted above.   Additional Comments:  The patient tolerated the procedure well Dressing: 2 x 2 sterile gauze and Band-Aid    Post-procedure details: Patient was observed during the procedure. Post-procedure instructions were reviewed.  Patient left the clinic in stable condition.

## 2020-09-01 DIAGNOSIS — L03114 Cellulitis of left upper limb: Secondary | ICD-10-CM | POA: Diagnosis not present

## 2020-09-02 ENCOUNTER — Ambulatory Visit: Payer: Medicare PPO | Admitting: Gastroenterology

## 2020-09-02 ENCOUNTER — Other Ambulatory Visit: Payer: Self-pay

## 2020-09-02 ENCOUNTER — Encounter: Payer: Self-pay | Admitting: Gastroenterology

## 2020-09-02 DIAGNOSIS — Z1212 Encounter for screening for malignant neoplasm of rectum: Secondary | ICD-10-CM | POA: Diagnosis not present

## 2020-09-02 DIAGNOSIS — Z1211 Encounter for screening for malignant neoplasm of colon: Secondary | ICD-10-CM | POA: Diagnosis not present

## 2020-09-02 NOTE — Patient Instructions (Signed)
Please complete the stool kit when you are able. If negative, your next colonoscopy will be in 2025. If positive, we recommend a colonoscopy. ° °It was nice to meet you! Have a wonderful Christmas! ° °It was a pleasure to see you today. I want to create trusting relationships with patients to provide genuine, compassionate, and quality care. I value your feedback. If you receive a survey regarding your visit,  I greatly appreciate you taking time to fill this out.  ° °Anna W. Boone, PhD, ANP-BC °[facility]  ° ° °

## 2020-09-02 NOTE — Progress Notes (Signed)
Primary Care Physician:  Celene Squibb, MD  Referring Physician: Dr. Nevada Crane  Primary Gastroenterologist:  Dr. Gala Romney  Chief Complaint  Patient presents with  . Consult    TCS    HPI:   Seth [name] is a 64 y.o. male presenting today at the request of Dr. Nevada Crane to discuss screening colonoscopy. Last colonoscopy actually in 2015 at an outside facility with medium-sized lipoma in transverse colon.   He has no complaints today. No abdominal pain, nausea, vomiting, GERD, dysphagia, rectal bleeding, constipation, or diarrhea. Trying to purposefully lose weight. No GI concerns. NO family history of colorectal cancer or polyps.  Although he is on iron, he is not iron deficient. Recent labs to be scanned into epic.   Past Medical History:  Diagnosis Date  . Anxiety   . Depression   . Diabetes mellitus without complication (HCC)    history of, no medications now after Bariatric surgery.  Marland Kitchen History of kidney stones   . Hypertension   . Osteoarthritis of both knees   . Sleep apnea    dx. at one time, then they said no- mild, no cpap  . Transient ischemic attack (TIA)     Past Surgical History:  Procedure Laterality Date  . ACHILLES TENDON SURGERY Right 1994  . Media SURGERY     2005  . cataracts Bilateral 2008  . CHOLECYSTECTOMY    . COLONOSCOPY  2015   Outside facility:  medium-sized lipoma in transverse colon.   Marland Kitchen EXTRACORPOREAL SHOCK WAVE LITHOTRIPSY Left 06/13/2018   Procedure: LEFT EXTRACORPOREAL SHOCK WAVE LITHOTRIPSY (ESWL) WITH MAC;  Surgeon: Irine Seal, MD;  Location: [facility];  Service: Urology;  Laterality: Left;  . repair of wrist fx Right   . TONSILLECTOMY      Current Outpatient Medications  Medication Sig Dispense Refill  . albuterol (VENTOLIN HFA) 108 (90 Base) MCG/ACT inhaler Ventolin HFA 90 mcg/actuation aerosol inhaler    . ALPRAZolam (XANAX) 1 MG tablet Take 1 mg by mouth 3 (three) times daily as needed for anxiety.    Marland Kitchen amLODipine-olmesartan  (AZOR) 10-40 MG tablet Take 1 tablet by mouth daily.    Marland Kitchen aspirin 81 MG EC tablet Take by mouth.    Marland Kitchen atorvastatin (LIPITOR) 10 MG tablet atorvastatin 10 mg tablet    . cetirizine (ZYRTEC) 10 MG tablet Take by mouth.    . citalopram (CELEXA) 40 MG tablet Take 40 mg by mouth every evening.    . diclofenac Sodium (VOLTAREN) 1 % GEL Apply topically. As needed    . ferrous sulfate 325 (65 FE) MG tablet Take 325 mg by mouth daily with breakfast.     . fluticasone (FLONASE) 50 MCG/ACT nasal spray As needed    . levETIRAcetam (KEPPRA) 500 MG tablet Take 500 mg by mouth every evening.    . metoprolol (TOPROL-XL) 200 MG 24 hr tablet Take 200 mg by mouth every evening.    . montelukast (SINGULAIR) 10 MG tablet Take 10 mg by mouth at bedtime.    Marland Kitchen oxyCODONE-acetaminophen (PERCOCET/ROXICET) 5-325 MG tablet Take 1-2 tablets by mouth every 4 (four) hours as needed for severe pain.     . silodosin (RAPAFLO) 8 MG CAPS capsule Take 1 capsule (8 mg total) by mouth daily with breakfast. 30 capsule 1  . triamcinolone cream (KENALOG) 0.5 % Apply topically 2 (two) times daily.     No current facility-administered medications for this visit.    Allergies as  of 09/02/2020 - Review Complete 09/02/2020  Allergen Reaction Noted  . Latex Other (See Comments) 06/11/2018  . Sulfa antibiotics Other (See Comments) 06/11/2018  . Amoxicillin-pot clavulanate Diarrhea and Nausea Only 09/10/2014  . Tape Rash 01/13/2013    Family History  Problem Relation Age of Onset  . Emphysema Mother   . Lung cancer Father   . Heart attack Brother   . Colon cancer Neg Hx   . Colon polyps Neg Hx     Social History   Socioeconomic History  . Marital status: Married    Spouse name: Not on file  . Number of children: Not on file  . Years of education: Not on file  . Highest education level: Not on file  Occupational History  . Not on file  Tobacco Use  . Smoking status: Never Smoker  . Smokeless tobacco: Never Used  Vaping  Use  . Vaping Use: Never used  Substance and Sexual Activity  . Alcohol use: Not Currently  . Drug use: Never  . Sexual activity: Not on file  Other Topics Concern  . Not on file  Social History Narrative  . Not on file   Social Determinants of Health   Financial Resource Strain:   . Difficulty of Paying Living Expenses: Not on file  Food Insecurity:   . Worried About Charity fundraiser in the Last Year: Not on file  . Ran Out of Food in the Last Year: Not on file  Transportation Needs:   . Lack of Transportation (Medical): Not on file  . Lack of Transportation (Non-Medical): Not on file  Physical Activity:   . Days of Exercise per Week: Not on file  . Minutes of Exercise per Session: Not on file  Stress:   . Feeling of Stress : Not on file  Social Connections:   . Frequency of Communication with Friends and Family: Not on file  . Frequency of Social Gatherings with Friends and Family: Not on file  . Attends Religious Services: Not on file  . Active Member of Clubs or Organizations: Not on file  . Attends Archivist Meetings: Not on file  . Marital Status: Not on file  Intimate Partner Violence:   . Fear of Current or Ex-Partner: Not on file  . Emotionally Abused: Not on file  . Physically Abused: Not on file  . Sexually Abused: Not on file    Review of Systems: Gen: Denies any fever, chills, fatigue, weight loss, lack of appetite.  CV: Denies chest pain, heart palpitations, peripheral edema, syncope.  Resp: Denies shortness of breath at rest or with exertion. Denies wheezing or cough.  GI: see HPI GU : Denies urinary burning, urinary frequency, urinary hesitancy MS: Denies joint pain, muscle weakness, cramps, or limitation of movement.  Derm: Denies rash, itching, dry skin Psych: Denies depression, anxiety, memory loss, and confusion Heme: Denies bruising, bleeding, and enlarged lymph nodes.  Physical Exam: BP 119/78   Pulse 72   Temp (!) 97.1 F  (36.2 C)   Ht _0  (1.803 m)   Wt (!) 343 lb 12.8 oz (155.9 kg)   BMI 47.95 kg/m  General:   Alert and oriented. Pleasant and cooperative. Well-nourished and well-developed.  Head:  Normocephalic and atraumatic. Eyes:  Without icterus, sclera clear and conjunctiva pink.  Ears:  Normal auditory acuity. Mouth:  Mask in place Lungs:  Clear to auscultation bilaterally. No wheezes, rales, or rhonchi. No distress.  Heart:  S1, S2  present without murmurs appreciated.  Abdomen:  +BS, soft, non-tender and non-distended. No HSM noted. No guarding or rebound. No masses appreciated.  Rectal:  Deferred  Msk:  Symmetrical without gross deformities. Normal posture. Extremities:  Without edema. Left lower arm with cellulitis, PCP aware and treating Neurologic:  Alert and  oriented x4;  grossly normal neurologically. Skin:  Intact without significant lesions or rashes. Psych:  Alert and cooperative. Normal mood and affect.  ASSESSMENT: Seth [name] is a very pleasant 64 y.o. male presenting today at the request of Dr. Nevada Crane for colonoscopy consideration. Patient actually forgot that he has had a colonoscopy in 2015, without polyps, and no family history of colorectal cancer or polyps. He has no concerning GI signs/symptoms. We discussed next colonoscopy in 2025; however, we will pursue ifobt for routine screening measures. If positive, pursue colonoscopy. Patient agreeable to this.    PLAN: Colonoscopy for now in 2025 Ifobt Further recommendations to follow  Annitta Needs, PhD, ANP-BC [facility]

## 2020-09-02 NOTE — Progress Notes (Signed)
Cc'ed to pcp °

## 2020-09-03 ENCOUNTER — Telehealth: Payer: Self-pay

## 2020-09-03 NOTE — Telephone Encounter (Signed)
Approved for Monovisc-Bilateral knee Dr. Myrtie Cruise and Bill $35 copay No prior auth required    [facility] to schedule @ next available

## 2020-09-06 NOTE — Telephone Encounter (Signed)
Called and scheduled. Pt is aware of copay 

## 2020-09-15 ENCOUNTER — Other Ambulatory Visit: Payer: Self-pay

## 2020-09-15 ENCOUNTER — Ambulatory Visit (INDEPENDENT_AMBULATORY_CARE_PROVIDER_SITE_OTHER): Payer: Medicare PPO | Admitting: Orthopedic Surgery

## 2020-09-15 DIAGNOSIS — M170 Bilateral primary osteoarthritis of knee: Secondary | ICD-10-CM

## 2020-09-20 ENCOUNTER — Encounter: Payer: Self-pay | Admitting: Orthopedic Surgery

## 2020-09-20 DIAGNOSIS — M170 Bilateral primary osteoarthritis of knee: Secondary | ICD-10-CM | POA: Diagnosis not present

## 2020-09-20 MED ORDER — HYALURONAN 88 MG/4ML IX SOSY
88.0000 mg | PREFILLED_SYRINGE | INTRA_ARTICULAR | Status: AC | PRN
  Administered 2020-09-20: 88 mg via INTRA_ARTICULAR

## 2020-09-20 MED ORDER — LIDOCAINE HCL 1 % IJ SOLN
5.0000 mL | INTRAMUSCULAR | Status: AC | PRN
  Administered 2020-09-20: 5 mL

## 2020-09-20 NOTE — Progress Notes (Signed)
   Procedure Note  Patient: Seth [name]             Date of Birth: 18-Oct-1955           MRN: [id number]             Visit Date: 09/15/2020  Procedures: Visit Diagnoses:  1. Primary osteoarthritis of both knees     Large Joint Inj: bilateral knee on 09/20/2020 12:06 AM Indications: pain, joint swelling and diagnostic evaluation Details: 18 G 1.5 in needle, superolateral approach  Arthrogram: No  Medications (Right): 5 mL lidocaine 1 %; 88 mg Hyaluronan 88 MG/4ML Medications (Left): 5 mL lidocaine 1 %; 88 mg Hyaluronan 88 MG/4ML Outcome: tolerated well, no immediate complications Procedure, treatment alternatives, risks and benefits explained, specific risks discussed. Consent was given by the patient. Immediately prior to procedure a time out was called to verify the correct patient, procedure, equipment, support staff and site/side marked as required. Patient was prepped and draped in the usual sterile fashion.

## 2020-10-08 DIAGNOSIS — M9903 Segmental and somatic dysfunction of lumbar region: Secondary | ICD-10-CM | POA: Diagnosis not present

## 2020-10-08 DIAGNOSIS — M9901 Segmental and somatic dysfunction of cervical region: Secondary | ICD-10-CM | POA: Diagnosis not present

## 2020-10-08 DIAGNOSIS — M542 Cervicalgia: Secondary | ICD-10-CM | POA: Diagnosis not present

## 2020-10-08 DIAGNOSIS — M9902 Segmental and somatic dysfunction of thoracic region: Secondary | ICD-10-CM | POA: Diagnosis not present

## 2020-10-11 ENCOUNTER — Other Ambulatory Visit: Payer: Self-pay | Admitting: Physical Medicine and Rehabilitation

## 2020-10-11 MED ORDER — DIAZEPAM 5 MG PO TABS: ORAL_TABLET | ORAL | 0 refills | Status: DC

## 2020-10-11 NOTE — Progress Notes (Signed)
Pre-procedure diazepam ordered for pre-operative anxiety.  

## 2020-10-12 ENCOUNTER — Other Ambulatory Visit: Payer: Self-pay

## 2020-10-12 ENCOUNTER — Ambulatory Visit: Payer: Self-pay

## 2020-10-12 ENCOUNTER — Encounter: Payer: Self-pay | Admitting: Physical Medicine and Rehabilitation

## 2020-10-12 ENCOUNTER — Ambulatory Visit: Payer: Medicare PPO | Admitting: Physical Medicine and Rehabilitation

## 2020-10-12 VITALS — BP 117/80 | HR 64

## 2020-10-12 DIAGNOSIS — M5416 Radiculopathy, lumbar region: Secondary | ICD-10-CM

## 2020-10-12 DIAGNOSIS — G894 Chronic pain syndrome: Secondary | ICD-10-CM

## 2020-10-12 MED ORDER — LIDOCAINE HCL (PF) 1 % IJ SOLN
2.0000 mL | Freq: Once | INTRAMUSCULAR | Status: AC
  Administered 2020-10-12: 2 mL

## 2020-10-12 MED ORDER — CLINDAMYCIN HCL 150 MG PO CAPS
150.0000 mg | ORAL_CAPSULE | Freq: Three times a day (TID) | ORAL | 0 refills | Status: DC
Start: 2020-10-12 — End: 2022-10-25

## 2020-10-12 NOTE — Patient Instructions (Signed)

## 2020-10-12 NOTE — Progress Notes (Signed)
Low back pain. Pain radiates down posterior thighs to knees.  Numeric Pain Rating Scale and Functional Assessment Average Pain 8   In the last MONTH (on 0-10 scale) has pain interfered with the following?  1. General activity like being  able to carry out your everyday physical activities such as walking, climbing stairs, carrying groceries, or moving a chair?  Rating(5)   +Driver, -BT

## 2020-10-12 NOTE — Procedures (Signed)
Spinal Cord Stimulator Trial  Patient: Seth [name]      Date of Birth: Feb 23, 1956 MRN: [id number] PCP: Benita Stabile, MD      Visit Date: 10/12/2020   Universal Protocol:    Date/Time: 10/12/2208:15 AM  Consent Given By: the patient  Position: PRONE  Additional Comments: Vital signs were monitored before and after the procedure. Patient was prepped and draped in the usual sterile fashion. The correct patient, procedure, and site was verified.   Injection Procedure Details:  Procedure Site One Meds Administered:  Meds ordered this encounter  Medications  . clindamycin (CLEOCIN) 150 MG capsule    Sig: Take 1 capsule (150 mg total) by mouth 3 (three) times daily.    Dispense:  21 capsule    Refill:  0  . lidocaine (PF) (XYLOCAINE) 1 % injection 2 mL     Location/Site: Leads were placed just to the left midline with the upper most lead at the top of the T7 vertebral body just at the superior endplate and then spanning 3 vertebral bodies with the inferior lead at the bottom of the T9 vertebral body.   Needle size: 14 gauge   Needle type: EPIMED RX Coud Epidural Needle  Needle Placement: T12-L1 Dorsal epidural space  Findings:  -  -Comments: Excellent paresthesia coverage was obtained in all of the areas of the patient's normal pain  Procedure Details: After localization of the T12-L1 epidural space and the overlying soft tissue, the needle was introduced two bodies below this level to produce an adequate angle for epidural penetration. The soft tissue was infiltrated with 2-3 mls. of 1% Lidocaine without Epinephrine. Using the posterolateral approach, the #14 gauge Epimed needle was inserted down to the posterior aspect of the L1 lamina and then "walked off" the superior aspect of this lamina into the T12-L1 epidural space. The epidural space was localized with loss of resistance and negative aspirate for CSF or blood. The [employer] (16) electrode  stimulation lead was passed up so that just the superior most lead was at the location noted above while maintaining the lead in the midline.   The pulse generator system was adjusted and programmed by myself and the company technical representative by varying the stimulator sites, rates, pulse amplitude, and duration. Adequate coverage was obtained as described above.  Subsequent to this, the introducer needle was removed and the spinal cord stimulator trial lead was fastened to the skin using steri-strips and strain reduction loop. The lead wire was then connected and Tegaderm was applied to produce an occlusive dressing. The patient was then brought out of the procedure room and was given a portable stimulator.  The patient will follow-up in three to seven days to determine whether this was efficacious.  Oral prophylactic antibiotic, clindamycin, was prescribed as noted.   Additional Comments:  The patient tolerated the procedure well Dressing: As noted above    Post-procedure details: Patient was observed during the procedure. Post-procedure instructions were reviewed.  Patient left the clinic in stable condition.

## 2020-10-12 NOTE — Progress Notes (Signed)
Seth [name] - 65 y.o. male MRN [id number]  Date of birth: December 22, 1955  Office Visit Note: Visit Date: 10/12/2020 PCP: Benita Stabile, MD Referred by: Benita Stabile, MD  Subjective: Chief Complaint  Patient presents with  . Lower Back - Pain   HPI:  Seth [name] is a 65 y.o. male who comes in today for planned spinal cord stimulator trial. The patient has failed conservative care including home exercise, medications, time and activity modification as well as injections and surgery.  This represents a procedure of last resort for recalcitrant pain. Pre-procedure psychological evaluation has been completed and procedure pre-authorization has been obtained.  Please see requesting physician notes for further details and justification.  Referring:  Dr. Marrianne Mood Dean   ROS Otherwise per HPI.  Assessment & Plan: Visit Diagnoses:    ICD-10-CM   1. Lumbar radiculopathy  M54.16 XR C-ARM NO REPORT    Spinal Cord Stimulator - Placement    Spinal Cord Stimulator - Analysis    lidocaine (PF) (XYLOCAINE) 1 % injection 2 mL  2. Chronic pain syndrome  G89.4 XR C-ARM NO REPORT    Spinal Cord Stimulator - Placement    Spinal Cord Stimulator - Analysis    lidocaine (PF) (XYLOCAINE) 1 % injection 2 mL    Plan: No additional findings.   Meds & Orders:  Meds ordered this encounter  Medications  . clindamycin (CLEOCIN) 150 MG capsule    Sig: Take 1 capsule (150 mg total) by mouth 3 (three) times daily.    Dispense:  21 capsule    Refill:  0  . lidocaine (PF) (XYLOCAINE) 1 % injection 2 mL    Orders Placed This Encounter  Procedures  . Spinal Cord Stimulator - Placement  . Spinal Cord Stimulator - Analysis  . XR C-ARM NO REPORT    Follow-up: Return in about 1 week (around 10/19/2020).   Procedures: No procedures performed  Spinal Cord Stimulator Trial  Patient: Seth [name]      Date of Birth: 1956/07/28 MRN: [id number] PCP: Benita Stabile, MD      Visit Date: 10/12/2020   Universal  Protocol:    Date/Time: 10/12/2208:15 AM  Consent Given By: the patient  Position: PRONE  Additional Comments: Vital signs were monitored before and after the procedure. Patient was prepped and draped in the usual sterile fashion. The correct patient, procedure, and site was verified.   Injection Procedure Details:  Procedure Site One Meds Administered:  Meds ordered this encounter  Medications  . clindamycin (CLEOCIN) 150 MG capsule    Sig: Take 1 capsule (150 mg total) by mouth 3 (three) times daily.    Dispense:  21 capsule    Refill:  0  . lidocaine (PF) (XYLOCAINE) 1 % injection 2 mL     Location/Site: Leads were placed just to the left midline with the upper most lead at the top of the T7 vertebral body just at the superior endplate and then spanning 3 vertebral bodies with the inferior lead at the bottom of the T9 vertebral body.   Needle size: 14 gauge   Needle type: EPIMED RX Coud Epidural Needle  Needle Placement: T12-L1 Dorsal epidural space  Findings:  -  -Comments: Excellent paresthesia coverage was obtained in all of the areas of the patient's normal pain  Procedure Details: After localization of the T12-L1 epidural space and the overlying soft tissue, the needle was introduced two bodies below this level to produce an  adequate angle for epidural penetration. The soft tissue was infiltrated with 2-3 mls. of 1% Lidocaine without Epinephrine. Using the posterolateral approach, the #14 gauge Epimed needle was inserted down to the posterior aspect of the L1 lamina and then "walked off" the superior aspect of this lamina into the T12-L1 epidural space. The epidural space was localized with loss of resistance and negative aspirate for CSF or blood. The [employer] (16) electrode stimulation lead was passed up so that just the superior most lead was at the location noted above while maintaining the lead in the midline.   The pulse generator system was  adjusted and programmed by myself and the company technical representative by varying the stimulator sites, rates, pulse amplitude, and duration. Adequate coverage was obtained as described above.  Subsequent to this, the introducer needle was removed and the spinal cord stimulator trial lead was fastened to the skin using steri-strips and strain reduction loop. The lead wire was then connected and Tegaderm was applied to produce an occlusive dressing. The patient was then brought out of the procedure room and was given a portable stimulator.  The patient will follow-up in three to seven days to determine whether this was efficacious.  Oral prophylactic antibiotic, clindamycin, was prescribed as noted.   Additional Comments:  The patient tolerated the procedure well Dressing: As noted above    Post-procedure details: Patient was observed during the procedure. Post-procedure instructions were reviewed.  Patient left the clinic in stable condition.       Clinical History: MRI LUMBAR SPINE WITHOUT CONTRAST  TECHNIQUE: Multiplanar, multisequence MR imaging of the lumbar spine was performed. No intravenous contrast was administered.  COMPARISON:  Abdominal CT 08/08/2017  FINDINGS: Segmentation:  Standard lumbar numbering  Alignment:  Grade 1 anterolisthesis at L4-5  Vertebrae: Mild marrow edema in the anterior superior corner of S1 with mild adjacent prevertebral fat edema-new. No fracture or discitis.  Conus medullaris and cauda equina: Conus extends to the T12-L1 level. Conus and cauda equina appear normal.  Paraspinal and other soft tissues: Negative  Disc levels:  T12- L1: Spondylosis and mild facet spurring.  No impingement  L1-L2: Facet spurring.  Minor annulus bulging.  No impingement  L2-L3: Mild facet spurring and annulus bulging.  No impingement  L3-L4: Facet spurring and epidural fat effacement causes mild-to-moderate thecal sac narrowing. Patent  foramina  L4-L5: Advanced facet arthropathy with spurring and anterolisthesis. Mild bulging of the uncovered disc. Bilateral foraminal narrowing which is mild. Moderate thecal sac narrowing.  L5-S1:Bilateral facet spurring which is bulky. Spondylosis with anterior spurring as described above. Epidural fat expansion without complete thecal sac effacement.  IMPRESSION: 1. Prominent facet osteoarthritis especially at L4-5 where there is grade 1 anterolisthesis. 2. Thecal sac narrowing that is moderate at L4-5 and mild-to-moderate at L3-4. 3. Mild marrow and fat edema around an S1 osteophyte, which may be symptomatic.   Electronically Signed   By: Marnee Spring M.D.   On: 04/24/2019 09:35     Objective:  VS:  HT:    WT:   BMI:     BP:117/80  HR:64bpm  TEMP: ( )  RESP:  Physical Exam Vitals and nursing note reviewed.  Constitutional:      General: He is not in acute distress.    Appearance: Normal appearance. He is obese. He is not ill-appearing.  HENT:     Head: Normocephalic and atraumatic.     Right Ear: External ear normal.     Left Ear:  External ear normal.     Nose: No congestion.  Eyes:     Extraocular Movements: Extraocular movements intact.  Cardiovascular:     Rate and Rhythm: Normal rate.     Pulses: Normal pulses.  Pulmonary:     Effort: Pulmonary effort is normal. No respiratory distress.  Abdominal:     General: There is no distension.     Palpations: Abdomen is soft.  Musculoskeletal:        General: No tenderness or signs of injury.     Cervical back: Neck supple.     Right lower leg: No edema.     Left lower leg: No edema.     Comments: Patient has good distal strength without clonus.  Skin:    Findings: No erythema or rash.  Neurological:     General: No focal deficit present.     Mental Status: He is alert and oriented to person, place, and time.     Sensory: No sensory deficit.     Motor: No weakness or abnormal muscle tone.      Coordination: Coordination normal.  Psychiatric:        Mood and Affect: Mood normal.        Behavior: Behavior normal.      Imaging: No results found.

## 2020-10-19 ENCOUNTER — Other Ambulatory Visit: Payer: Self-pay

## 2020-10-19 ENCOUNTER — Ambulatory Visit (INDEPENDENT_AMBULATORY_CARE_PROVIDER_SITE_OTHER): Payer: Medicare PPO | Admitting: Physical Medicine and Rehabilitation

## 2020-10-19 DIAGNOSIS — G894 Chronic pain syndrome: Secondary | ICD-10-CM

## 2020-10-19 DIAGNOSIS — M5416 Radiculopathy, lumbar region: Secondary | ICD-10-CM

## 2020-10-19 DIAGNOSIS — M48062 Spinal stenosis, lumbar region with neurogenic claudication: Secondary | ICD-10-CM

## 2020-10-19 NOTE — Progress Notes (Unsigned)
60% better with SCS Trial.

## 2020-10-20 ENCOUNTER — Encounter: Payer: Self-pay | Admitting: Physical Medicine and Rehabilitation

## 2020-10-20 NOTE — Progress Notes (Signed)
Seth [name] - 65 y.o. male MRN [id number]  Date of birth: Oct 07, 1955  Office Visit Note: Visit Date: 10/19/2020 PCP: Celene Squibb, MD Referred by: Celene Squibb, MD  Subjective: Chief Complaint  Patient presents with  . Lower Back - Pain   HPI: Seth [name] is a 65 y.o. male who comes in today spinal cord stimulator lead pull and management of chronic worsening severe pain which has been recalcitrant to previous treatment.  He reports greater than 60% pain relief with the stimulator trial and is very pleased with the amount of relief and functional increase he has obtained.  He is able to walk and do more with the stimulator in place.  He does want to move forward with implant.  Quick procedure note: Patient was placed prone on the exam table. The external stimulator/generator was unfastened from the leads. The outer Tegaderm was removed from the bandage carefully. There was no noted erythema of the skin or swelling or induration or drainage. There had been some bloody discharge in the gauze pad. Both trial leads were pulled without difficulty and without discomfort. There was no drainage from the insertion site. Band-Aid was applied.  ROS Otherwise per HPI.  Assessment & Plan: Visit Diagnoses:    ICD-10-CM   1. Lumbar radiculopathy  M54.16 Ambulatory referral to Anesthesiology  2. Chronic pain syndrome  G89.4 Ambulatory referral to Anesthesiology  3. Spinal stenosis of lumbar region with neurogenic claudication  M48.062 Ambulatory referral to Anesthesiology     Plan: No additional findings.   Meds & Orders: No orders of the defined types were placed in this encounter.   Orders Placed This Encounter  Procedures  . Ambulatory referral to Anesthesiology    Follow-up: Return if symptoms worsen or fail to improve.   Procedures: No procedures performed      Clinical History: MRI LUMBAR SPINE WITHOUT CONTRAST  TECHNIQUE: Multiplanar, multisequence MR imaging of the lumbar  spine was performed. No intravenous contrast was administered.  COMPARISON:  Abdominal CT 08/08/2017  FINDINGS: Segmentation:  Standard lumbar numbering  Alignment:  Grade 1 anterolisthesis at L4-5  Vertebrae: Mild marrow edema in the anterior superior corner of S1 with mild adjacent prevertebral fat edema-new. No fracture or discitis.  Conus medullaris and cauda equina: Conus extends to the T12-L1 level. Conus and cauda equina appear normal.  Paraspinal and other soft tissues: Negative  Disc levels:  T12- L1: Spondylosis and mild facet spurring.  No impingement  L1-L2: Facet spurring.  Minor annulus bulging.  No impingement  L2-L3: Mild facet spurring and annulus bulging.  No impingement  L3-L4: Facet spurring and epidural fat effacement causes mild-to-moderate thecal sac narrowing. Patent foramina  L4-L5: Advanced facet arthropathy with spurring and anterolisthesis. Mild bulging of the uncovered disc. Bilateral foraminal narrowing which is mild. Moderate thecal sac narrowing.  L5-S1:Bilateral facet spurring which is bulky. Spondylosis with anterior spurring as described above. Epidural fat expansion without complete thecal sac effacement.  IMPRESSION: 1. Prominent facet osteoarthritis especially at L4-5 where there is grade 1 anterolisthesis. 2. Thecal sac narrowing that is moderate at L4-5 and mild-to-moderate at L3-4. 3. Mild marrow and fat edema around an S1 osteophyte, which may be symptomatic.   Electronically Signed   By: Monte Fantasia M.D.   On: 04/24/2019 09:35   He reports that he has never smoked. He has never used smokeless tobacco. No results for input(s): HGBA1C, LABURIC in the last 8760 hours.  Objective:  VS:  HT:  WT:   BMI:     BP:   HR: bpm  TEMP: ( )  RESP:  Physical Exam Vitals and nursing note reviewed.  Constitutional:      General: He is not in acute distress.    Appearance: Normal appearance. He is obese. He  is not ill-appearing.  HENT:     Head: Normocephalic and atraumatic.     Right Ear: External ear normal.     Left Ear: External ear normal.     Nose: No congestion.  Eyes:     Extraocular Movements: Extraocular movements intact.  Cardiovascular:     Rate and Rhythm: Normal rate.     Pulses: Normal pulses.  Pulmonary:     Effort: Pulmonary effort is normal. No respiratory distress.  Abdominal:     General: There is no distension.     Palpations: Abdomen is soft.  Musculoskeletal:        General: No tenderness or signs of injury.     Cervical back: Neck supple.     Right lower leg: No edema.     Left lower leg: No edema.     Comments: Patient has good distal strength without clonus.  Skin:    Findings: No erythema or rash.     Comments: There was some redness around areas where the patient had used Band-Aid brand Band-Aids to secure some of the outer parts of the gauze pad.  Tegaderm appeared to have been removed.  There was no drainage no induration no swelling.  Neurological:     General: No focal deficit present.     Mental Status: He is alert and oriented to person, place, and time.     Sensory: No sensory deficit.     Motor: No weakness or abnormal muscle tone.     Coordination: Coordination normal.  Psychiatric:        Mood and Affect: Mood normal.        Behavior: Behavior normal.     Ortho Exam  Imaging: No results found.  Past Medical/Family/Surgical/Social History: Medications & Allergies reviewed per EMR, new medications updated. Patient Active Problem List   Diagnosis Date Noted  . Screening for colorectal cancer 09/02/2020  . Knee pain 07/15/2020  . Sciatica of right side 04/19/2017  . TIA (transient ischemic attack) 04/19/2017  . Adjustment disorder with anxious mood 03/23/2016  . Opioid type dependence, continuous ([place]) 12/08/2015  . Seizure prophylaxis 10/13/2015  . Allergic rhinitis 12/17/2014  . Morbid obesity ([place]) 05/06/2014  . Anxiety  07/17/2012  . Depression 07/17/2012  . Essential hypertension 07/17/2012  . Intraparenchymal hemorrhage of brain ([place]) 07/16/2012   Past Medical History:  Diagnosis Date  . Anxiety   . Depression   . Diabetes mellitus without complication (HCC)    history of, no medications now after Bariatric surgery.  Marland Kitchen History of kidney stones   . Hypertension   . Osteoarthritis of both knees   . Sleep apnea    dx. at one time, then they said no- mild, no cpap  . Transient ischemic attack (TIA)    Family History  Problem Relation Age of Onset  . Emphysema Mother   . Lung cancer Father   . Heart attack Brother   . Colon cancer Neg Hx   . Colon polyps Neg Hx    Past Surgical History:  Procedure Laterality Date  . ACHILLES TENDON SURGERY Right 1994  . [place] SURGERY     2005  . cataracts Bilateral 2008  .  CHOLECYSTECTOMY    . COLONOSCOPY  2015   Outside facility:  medium-sized lipoma in transverse colon.   Marland Kitchen EXTRACORPOREAL SHOCK WAVE LITHOTRIPSY Left 06/13/2018   Procedure: LEFT EXTRACORPOREAL SHOCK WAVE LITHOTRIPSY (ESWL) WITH MAC;  Surgeon: Irine Seal, MD;  Location: [facility];  Service: Urology;  Laterality: Left;  . repair of wrist fx Right   . TONSILLECTOMY     Social History   Occupational History  . Not on file  Tobacco Use  . Smoking status: Never Smoker  . Smokeless tobacco: Never Used  Vaping Use  . Vaping Use: Never used  Substance and Sexual Activity  . Alcohol use: Not Currently  . Drug use: Never  . Sexual activity: Not on file

## 2020-10-25 DIAGNOSIS — Z0001 Encounter for general adult medical examination with abnormal findings: Secondary | ICD-10-CM | POA: Diagnosis not present

## 2020-10-25 DIAGNOSIS — R300 Dysuria: Secondary | ICD-10-CM | POA: Diagnosis not present

## 2020-10-25 DIAGNOSIS — Z6841 Body Mass Index (BMI) 40.0 and over, adult: Secondary | ICD-10-CM | POA: Diagnosis not present

## 2020-10-25 DIAGNOSIS — N529 Male erectile dysfunction, unspecified: Secondary | ICD-10-CM | POA: Diagnosis not present

## 2020-10-25 DIAGNOSIS — L209 Atopic dermatitis, unspecified: Secondary | ICD-10-CM | POA: Diagnosis not present

## 2020-10-25 DIAGNOSIS — N3001 Acute cystitis with hematuria: Secondary | ICD-10-CM | POA: Diagnosis not present

## 2020-10-25 DIAGNOSIS — I10 Essential (primary) hypertension: Secondary | ICD-10-CM | POA: Diagnosis not present

## 2020-10-25 DIAGNOSIS — R319 Hematuria, unspecified: Secondary | ICD-10-CM | POA: Diagnosis not present

## 2020-10-25 DIAGNOSIS — F39 Unspecified mood [affective] disorder: Secondary | ICD-10-CM | POA: Diagnosis not present

## 2020-11-01 DIAGNOSIS — M25569 Pain in unspecified knee: Secondary | ICD-10-CM | POA: Diagnosis not present

## 2020-11-01 DIAGNOSIS — M170 Bilateral primary osteoarthritis of knee: Secondary | ICD-10-CM | POA: Diagnosis not present

## 2020-11-01 DIAGNOSIS — R7303 Prediabetes: Secondary | ICD-10-CM | POA: Diagnosis not present

## 2020-11-01 DIAGNOSIS — E782 Mixed hyperlipidemia: Secondary | ICD-10-CM | POA: Diagnosis not present

## 2020-11-01 DIAGNOSIS — M5430 Sciatica, unspecified side: Secondary | ICD-10-CM | POA: Diagnosis not present

## 2020-11-01 DIAGNOSIS — F419 Anxiety disorder, unspecified: Secondary | ICD-10-CM | POA: Diagnosis not present

## 2020-11-01 DIAGNOSIS — G8929 Other chronic pain: Secondary | ICD-10-CM | POA: Diagnosis not present

## 2020-11-01 DIAGNOSIS — E1169 Type 2 diabetes mellitus with other specified complication: Secondary | ICD-10-CM | POA: Diagnosis not present

## 2020-11-01 DIAGNOSIS — I10 Essential (primary) hypertension: Secondary | ICD-10-CM | POA: Diagnosis not present

## 2020-11-09 DIAGNOSIS — M48062 Spinal stenosis, lumbar region with neurogenic claudication: Secondary | ICD-10-CM | POA: Diagnosis not present

## 2020-11-09 DIAGNOSIS — G894 Chronic pain syndrome: Secondary | ICD-10-CM | POA: Diagnosis not present

## 2020-11-09 DIAGNOSIS — M5416 Radiculopathy, lumbar region: Secondary | ICD-10-CM | POA: Diagnosis not present

## 2020-11-12 ENCOUNTER — Other Ambulatory Visit: Payer: Self-pay | Admitting: Pain Medicine

## 2020-11-30 ENCOUNTER — Encounter (HOSPITAL_COMMUNITY): Payer: Self-pay | Admitting: Pain Medicine

## 2020-11-30 ENCOUNTER — Other Ambulatory Visit (HOSPITAL_COMMUNITY)
Admission: RE | Admit: 2020-11-30 | Discharge: 2020-11-30 | Disposition: A | Discharge status: Home / Self Care | Payer: Medicare PPO | Source: Ambulatory Visit | Attending: Pain Medicine | Admitting: Pain Medicine

## 2020-11-30 DIAGNOSIS — Z01812 Encounter for preprocedural laboratory examination: Secondary | ICD-10-CM | POA: Diagnosis not present

## 2020-11-30 DIAGNOSIS — Z20822 Contact with and (suspected) exposure to covid-19: Secondary | ICD-10-CM | POA: Insufficient documentation

## 2020-11-30 LAB — SARS CORONAVIRUS 2 (TAT 6-24 HRS): SARS Coronavirus 2: NEGATIVE

## 2020-11-30 NOTE — Progress Notes (Signed)
Spoke with pt for pre-op call. Pt denies cardiac history. Pt is pre-diabetic. Last A1C was last month and it was 5.9 per pt. States he does check his blood sugar, but has not checked it the last 2 weeks. States it's usually around 118.   Hx of Intracerebral hemorrhage in 2013 ([facility]) Pt is on Keppra to prevent seizures.   Covid test done today, result pending. Instructed pt that he needs to quarantine and stay in quarantine until he comes to the hospital tomorrow.

## 2020-11-30 NOTE — Progress Notes (Signed)
Called pt with time change and that time change is later than original. Pt was concerned about not eating or drinking anything after midnight. I told him that I would check and see if I could get permission for him to have liquids past midnight. I spoke with Dorien Chihuahua, PA, on call for Dr. Lorrine Kin and he said pt could have clear liquids until 7:30 AM. I also mentioned to [facility] that since the pt's surgery is going to end at 6:30, he would be being discharged at 8:30 PM. Pt has an elderly lady that was going to pick him up but he feels that is too late for her to be driving. He would like to be able to stay overnight after surgery. Oswaldo Done states that should not be a problem, but Dr. Lorrine Kin would make that decision tomorrow. I called pt back and gave him the above instructions and information. He expressed much gratitude. Pt will arrive at 2 PM.

## 2020-12-01 ENCOUNTER — Other Ambulatory Visit: Payer: Self-pay

## 2020-12-01 ENCOUNTER — Encounter (HOSPITAL_COMMUNITY)
Admission: RE | Disposition: A | Discharge status: Home / Self Care | Payer: Self-pay | Source: Home / Self Care | Attending: Pain Medicine

## 2020-12-01 ENCOUNTER — Ambulatory Visit (HOSPITAL_COMMUNITY)
Admission: RE | Admit: 2020-12-01 | Discharge: 2020-12-01 | Disposition: A | Discharge status: Home / Self Care | Payer: Medicare PPO | Attending: Pain Medicine | Admitting: Pain Medicine

## 2020-12-01 ENCOUNTER — Ambulatory Visit (HOSPITAL_COMMUNITY): Payer: Medicare PPO | Admitting: Certified Registered Nurse Anesthetist

## 2020-12-01 ENCOUNTER — Encounter (HOSPITAL_COMMUNITY): Payer: Self-pay | Admitting: Pain Medicine

## 2020-12-01 ENCOUNTER — Ambulatory Visit (HOSPITAL_COMMUNITY): Payer: Medicare PPO

## 2020-12-01 DIAGNOSIS — G894 Chronic pain syndrome: Secondary | ICD-10-CM | POA: Diagnosis not present

## 2020-12-01 DIAGNOSIS — Z419 Encounter for procedure for purposes other than remedying health state, unspecified: Secondary | ICD-10-CM

## 2020-12-01 DIAGNOSIS — M5416 Radiculopathy, lumbar region: Secondary | ICD-10-CM | POA: Insufficient documentation

## 2020-12-01 DIAGNOSIS — F418 Other specified anxiety disorders: Secondary | ICD-10-CM | POA: Diagnosis not present

## 2020-12-01 DIAGNOSIS — Z981 Arthrodesis status: Secondary | ICD-10-CM | POA: Diagnosis not present

## 2020-12-01 DIAGNOSIS — M48062 Spinal stenosis, lumbar region with neurogenic claudication: Secondary | ICD-10-CM | POA: Diagnosis not present

## 2020-12-01 HISTORY — DX: Nontraumatic intracerebral hemorrhage, unspecified: I61.9

## 2020-12-01 HISTORY — DX: Bronchitis, not specified as acute or chronic: J40

## 2020-12-01 HISTORY — DX: Prediabetes: R73.03

## 2020-12-01 HISTORY — PX: SPINAL CORD STIMULATOR INSERTION: SHX5378

## 2020-12-01 LAB — POCT I-STAT, CHEM 8
BUN: 17 mg/dL (ref 8–23)
Calcium, Ion: 1.23 mmol/L (ref 1.15–1.40)
Chloride: 103 mmol/L (ref 98–111)
Creatinine, Ser: 0.9 mg/dL (ref 0.61–1.24)
Glucose, Bld: 101 mg/dL — ABNORMAL HIGH (ref 70–99)
HCT: 46 % (ref 39.0–52.0)
Hemoglobin: 15.6 g/dL (ref 13.0–17.0)
Potassium: 4.1 mmol/L (ref 3.5–5.1)
Sodium: 141 mmol/L (ref 135–145)
TCO2: 24 mmol/L (ref 22–32)

## 2020-12-01 SURGERY — INSERTION, SPINAL CORD STIMULATOR, LUMBAR
Anesthesia: General | Site: Back

## 2020-12-01 MED ORDER — CHLORHEXIDINE GLUCONATE CLOTH 2 % EX PADS: 6.0000 | MEDICATED_PAD | Freq: Once | CUTANEOUS | Status: DC

## 2020-12-01 MED ORDER — 0.9 % SODIUM CHLORIDE (POUR BTL) OPTIME
TOPICAL | Status: DC | PRN
  Administered 2020-12-01: 1000 mL

## 2020-12-01 MED ORDER — PROPOFOL 10 MG/ML IV BOLUS
INTRAVENOUS | Status: DC | PRN
  Administered 2020-12-01: 200 mg via INTRAVENOUS

## 2020-12-01 MED ORDER — FENTANYL CITRATE (PF) 250 MCG/5ML IJ SOLN
INTRAMUSCULAR | Status: AC
  Filled 2020-12-01: qty 5

## 2020-12-01 MED ORDER — ROCURONIUM BROMIDE 10 MG/ML (PF) SYRINGE
PREFILLED_SYRINGE | INTRAVENOUS | Status: DC | PRN
  Administered 2020-12-01: 40 mg via INTRAVENOUS

## 2020-12-01 MED ORDER — PHENYLEPHRINE HCL (PRESSORS) 10 MG/ML IV SOLN
INTRAVENOUS | Status: DC | PRN
  Administered 2020-12-01 (×2): 160 ug via INTRAVENOUS

## 2020-12-01 MED ORDER — CHLORHEXIDINE GLUCONATE 0.12 % MT SOLN
OROMUCOSAL | Status: AC
  Administered 2020-12-01: 15 mL
  Filled 2020-12-01: qty 15

## 2020-12-01 MED ORDER — LACTATED RINGERS IV SOLN: INTRAVENOUS | Status: DC | PRN

## 2020-12-01 MED ORDER — EPHEDRINE SULFATE 50 MG/ML IJ SOLN
INTRAMUSCULAR | Status: DC | PRN
  Administered 2020-12-01: 5 mg via INTRAVENOUS
  Administered 2020-12-01: 10 mg via INTRAVENOUS

## 2020-12-01 MED ORDER — FENTANYL CITRATE (PF) 100 MCG/2ML IJ SOLN: 25.0000 ug | INTRAMUSCULAR | Status: DC | PRN

## 2020-12-01 MED ORDER — ONDANSETRON HCL 4 MG/2ML IJ SOLN
INTRAMUSCULAR | Status: DC | PRN
  Administered 2020-12-01: 4 mg via INTRAVENOUS

## 2020-12-01 MED ORDER — BUPIVACAINE HCL 0.25 % IJ SOLN
INTRAMUSCULAR | Status: DC | PRN
  Administered 2020-12-01: 3 mL

## 2020-12-01 MED ORDER — MIDAZOLAM HCL 5 MG/5ML IJ SOLN
INTRAMUSCULAR | Status: DC | PRN
  Administered 2020-12-01: 2 mg via INTRAVENOUS

## 2020-12-01 MED ORDER — VANCOMYCIN HCL IN DEXTROSE 1-5 GM/200ML-% IV SOLN
1000.0000 mg | INTRAVENOUS | Status: AC
  Administered 2020-12-01: 1000 mg via INTRAVENOUS

## 2020-12-01 MED ORDER — SUGAMMADEX SODIUM 200 MG/2ML IV SOLN
INTRAVENOUS | Status: DC | PRN
  Administered 2020-12-01: 400 mg via INTRAVENOUS

## 2020-12-01 MED ORDER — ROCURONIUM BROMIDE 10 MG/ML (PF) SYRINGE
PREFILLED_SYRINGE | INTRAVENOUS | Status: AC
  Filled 2020-12-01: qty 10

## 2020-12-01 MED ORDER — FENTANYL CITRATE (PF) 250 MCG/5ML IJ SOLN
INTRAMUSCULAR | Status: DC | PRN
  Administered 2020-12-01: 25 ug via INTRAVENOUS
  Administered 2020-12-01: 150 ug via INTRAVENOUS
  Administered 2020-12-01: 50 ug via INTRAVENOUS

## 2020-12-01 MED ORDER — MIDAZOLAM HCL 2 MG/2ML IJ SOLN
INTRAMUSCULAR | Status: AC
  Filled 2020-12-01: qty 2

## 2020-12-01 MED ORDER — ONDANSETRON HCL 4 MG/2ML IJ SOLN: 4.0000 mg | Freq: Once | INTRAMUSCULAR | Status: DC | PRN

## 2020-12-01 MED ORDER — PROPOFOL 10 MG/ML IV BOLUS
INTRAVENOUS | Status: AC
  Filled 2020-12-01: qty 20

## 2020-12-01 MED ORDER — DEXAMETHASONE SODIUM PHOSPHATE 10 MG/ML IJ SOLN
INTRAMUSCULAR | Status: DC | PRN
  Administered 2020-12-01: 4 mg via INTRAVENOUS

## 2020-12-01 MED ORDER — DEXAMETHASONE SODIUM PHOSPHATE 10 MG/ML IJ SOLN
INTRAMUSCULAR | Status: AC
  Filled 2020-12-01: qty 2

## 2020-12-01 MED ORDER — SUCCINYLCHOLINE CHLORIDE 20 MG/ML IJ SOLN
INTRAMUSCULAR | Status: DC | PRN
  Administered 2020-12-01: 160 mg via INTRAVENOUS

## 2020-12-01 MED ORDER — LIDOCAINE 2% (20 MG/ML) 5 ML SYRINGE
INTRAMUSCULAR | Status: AC
  Filled 2020-12-01: qty 10

## 2020-12-01 MED ORDER — BACITRACIN ZINC 500 UNIT/GM EX OINT
TOPICAL_OINTMENT | CUTANEOUS | Status: AC
  Filled 2020-12-01: qty 28.35

## 2020-12-01 MED ORDER — ACETAMINOPHEN 10 MG/ML IV SOLN: 1000.0000 mg | Freq: Once | INTRAVENOUS | Status: DC | PRN

## 2020-12-01 MED ORDER — ONDANSETRON HCL 4 MG/2ML IJ SOLN
INTRAMUSCULAR | Status: AC
  Filled 2020-12-01: qty 2

## 2020-12-01 MED ORDER — VANCOMYCIN HCL IN DEXTROSE 1-5 GM/200ML-% IV SOLN
INTRAVENOUS | Status: AC
  Filled 2020-12-01: qty 200

## 2020-12-01 MED ORDER — PHENYLEPHRINE 40 MCG/ML (10ML) SYRINGE FOR IV PUSH (FOR BLOOD PRESSURE SUPPORT)
PREFILLED_SYRINGE | INTRAVENOUS | Status: AC
  Filled 2020-12-01: qty 10

## 2020-12-01 MED ORDER — LIDOCAINE 2% (20 MG/ML) 5 ML SYRINGE
INTRAMUSCULAR | Status: DC | PRN
  Administered 2020-12-01: 40 mg via INTRAVENOUS
  Administered 2020-12-01: 60 mg via INTRAVENOUS

## 2020-12-01 MED ORDER — PHENYLEPHRINE HCL-NACL 10-0.9 MG/250ML-% IV SOLN
INTRAVENOUS | Status: DC | PRN
  Administered 2020-12-01: 50 ug/min via INTRAVENOUS

## 2020-12-01 MED ORDER — KETOROLAC TROMETHAMINE 15 MG/ML IJ SOLN: 15.0000 mg | Freq: Once | INTRAMUSCULAR | Status: DC | PRN

## 2020-12-01 MED ORDER — BUPIVACAINE HCL (PF) 0.25 % IJ SOLN
INTRAMUSCULAR | Status: AC
  Filled 2020-12-01: qty 30

## 2020-12-01 SURGICAL SUPPLY — 60 items
ANCHOR CLICK (Anchor) ×1 IMPLANT
ANCHOR CLIK NEURO F/LEAD 2 (Anchor) ×1 IMPLANT
BAG DECANTER FOR FLEXI CONT (MISCELLANEOUS) ×2 IMPLANT
BENZOIN TINCTURE PRP APPL 2/3 (GAUZE/BANDAGES/DRESSINGS) IMPLANT
BINDER ABDOMINAL 12 ML 46-62 (SOFTGOODS) ×2 IMPLANT
BLADE CLIPPER SURG (BLADE) IMPLANT
CABLE OR STIMULATOR 2X8 61 (WIRE) ×2 IMPLANT
CHLORAPREP W/TINT 26 (MISCELLANEOUS) ×2 IMPLANT
CLEANER TIP ELECTROSURG 2X2 (MISCELLANEOUS) ×2 IMPLANT
CONTROL REMOTE FREELINK ALPHA (NEUROSURGERY SUPPLIES) ×2 IMPLANT
DERMABOND ADVANCED (GAUZE/BANDAGES/DRESSINGS) ×1
DERMABOND ADVANCED .7 DNX12 (GAUZE/BANDAGES/DRESSINGS) ×1 IMPLANT
DEVICE FIXATE SUTURING DUAL (MISCELLANEOUS) ×2 IMPLANT
DEVICE FIXATE SUTURING SINGLE (NEUROSURGERY SUPPLIES) ×4 IMPLANT
DRAPE C-ARM 42X72 X-RAY (DRAPES) ×2 IMPLANT
DRAPE C-ARMOR (DRAPES) ×2 IMPLANT
DRAPE LAPAROTOMY 100X72X124 (DRAPES) ×2 IMPLANT
DRAPE SURG 17X23 STRL (DRAPES) ×8 IMPLANT
DRSG AQUACEL AG ADV 3.5X 6 (GAUZE/BANDAGES/DRESSINGS) ×4 IMPLANT
DRSG OPSITE POSTOP 3X4 (GAUZE/BANDAGES/DRESSINGS) IMPLANT
ELECT REM PT RETURN 9FT ADLT (ELECTROSURGICAL) ×2
ELECTRODE REM PT RTRN 9FT ADLT (ELECTROSURGICAL) ×1 IMPLANT
GAUZE 4X4 16PLY RFD (DISPOSABLE) ×2 IMPLANT
GLOVE EXAM NITRILE XL STR (GLOVE) IMPLANT
GLOVE EXAM NITRILE XS STR PU (GLOVE) IMPLANT
GLOVE SRG 8 PF TXTR STRL LF DI (GLOVE) ×1 IMPLANT
GLOVE SURG UNDER POLY LF SZ8 (GLOVE) ×1
GOWN STRL REUS W/ TWL LRG LVL3 (GOWN DISPOSABLE) ×2 IMPLANT
GOWN STRL REUS W/ TWL XL LVL3 (GOWN DISPOSABLE) ×2 IMPLANT
GOWN STRL REUS W/TWL 2XL LVL3 (GOWN DISPOSABLE) IMPLANT
GOWN STRL REUS W/TWL LRG LVL3 (GOWN DISPOSABLE) ×2
GOWN STRL REUS W/TWL XL LVL3 (GOWN DISPOSABLE) ×2
KIT BASIN OR (CUSTOM PROCEDURE TRAY) ×2 IMPLANT
KIT CHARGING (KITS) ×1
KIT CHARGING PRECISION NEURO (KITS) ×1 IMPLANT
KIT IPG ALPHA WAVEWRITER (Stimulator) ×2 IMPLANT
KIT TURNOVER KIT B (KITS) ×2 IMPLANT
LEAD PERCUTANEOUS (Neuro Prosthesis/Implant) ×4 IMPLANT
NEEDLE 18GX1X1/2 (RX/OR ONLY) (NEEDLE) IMPLANT
NEEDLE EPIDURAL COUDE CVD 14X4 (NEEDLE) ×4 IMPLANT
NEEDLE HYPO 25X1 1.5 SAFETY (NEEDLE) ×2 IMPLANT
NS IRRIG 1000ML POUR BTL (IV SOLUTION) ×2 IMPLANT
PACK LAMINECTOMY NEURO (CUSTOM PROCEDURE TRAY) ×2 IMPLANT
SPONGE LAP 4X18 RFD (DISPOSABLE) ×2 IMPLANT
SPONGE SURGIFOAM ABS GEL SZ50 (HEMOSTASIS) IMPLANT
STAPLER SKIN PROX WIDE 3.9 (STAPLE) ×2 IMPLANT
STRIP CLOSURE SKIN 1/2X4 (GAUZE/BANDAGES/DRESSINGS) IMPLANT
SUT MNCRL AB 4-0 PS2 18 (SUTURE) IMPLANT
SUT SILK 0 (SUTURE) ×1
SUT SILK 0 MO-6 18XCR BRD 8 (SUTURE) ×1 IMPLANT
SUT SILK 0 TIES 10X30 (SUTURE) IMPLANT
SUT SILK 2 0 TIES 10X30 (SUTURE) IMPLANT
SUT VIC AB 2-0 CP2 18 (SUTURE) ×4 IMPLANT
SYR 10ML LL (SYRINGE) IMPLANT
SYR EPIDURAL 5ML GLASS (SYRINGE) ×2 IMPLANT
TOWEL GREEN STERILE (TOWEL DISPOSABLE) ×2 IMPLANT
TOWEL GREEN STERILE FF (TOWEL DISPOSABLE) ×2 IMPLANT
WATER STERILE IRR 1000ML POUR (IV SOLUTION) ×2 IMPLANT
WRENCH HEX 7.6 (SPINAL CORD STIMULATOR) ×2 IMPLANT
YANKAUER SUCT BULB TIP NO VENT (SUCTIONS) ×2 IMPLANT

## 2020-12-01 NOTE — Anesthesia Postprocedure Evaluation (Signed)
Anesthesia Post Note  Patient: Seth [name]  Procedure(s) Performed: Lumbar spinal cord stimulator placement (N/A Back)     Patient location during evaluation: PACU Anesthesia Type: General Level of consciousness: awake Pain management: pain level controlled Vital Signs Assessment: post-procedure vital signs reviewed and stable Respiratory status: spontaneous breathing, nonlabored ventilation, respiratory function stable and patient connected to nasal cannula oxygen Cardiovascular status: blood pressure returned to baseline and stable Postop Assessment: no apparent nausea or vomiting Anesthetic complications: no   No complications documented.  Last Vitals:  Vitals:   12/01/20 1811 12/01/20 1826  BP: (!) 141/88 130/79  Pulse: 85 83  Resp: 15 16  Temp:    SpO2: 93% 95%    Last Pain:  Vitals:   12/01/20 1826  TempSrc:   PainSc: 0-No pain                 Seth [name]

## 2020-12-01 NOTE — Progress Notes (Signed)
Pt is stable and ready for DC, transportation will not be here until approximately 7:30pm. They are travelling from [place]. Pt has been placed in room 16 and will continue to be monitored until discharged home. Pt does not require further VS monitoring.

## 2020-12-01 NOTE — Transfer of Care (Signed)
Immediate Anesthesia Transfer of Care Note  Patient: Seth [name]  Procedure(s) Performed: Lumbar spinal cord stimulator placement (N/A Back)  Patient Location: PACU  Anesthesia Type:General  Level of Consciousness: awake, alert , oriented, patient cooperative and responds to stimulation  Airway & Oxygen Therapy: Spontaneous RR, O2 via FM  Post-op Assessment: Report given to RN and Post -op Vital signs reviewed and stable  Post vital signs: Reviewed and stable  Last Vitals:  Vitals Value Taken Time  BP 151/94 12/01/20 1756  Temp    Pulse 87 12/01/20 1758  Resp 21 12/01/20 1758  SpO2 100 % 12/01/20 1758  Vitals shown include unvalidated device data.  Last Pain:  Vitals:   12/01/20 1511  TempSrc:   PainSc: 0-No pain      Patients Stated Pain Goal: 2 (12/01/20 1511)  Complications: No complications documented.

## 2020-12-01 NOTE — Op Note (Signed)
Procedure:   1.  Permanent Percutaneous Spinal Cord Stimulator lead Implant X 2 under direct flouroscopy  2.  Spinal cord stimulator battery implant  Pre-operative diagnosis  1.  Lumbar radiculopathy 2.  Chronic pain syndrome 3.  Spinal stenosis with neurogenic claudication   Post-operative diagnosis:  As above  Surgeon: Lorrine Kin  Procedure in detail:  Patient seen in pre op area.  Questions answered, consent obtained.  IV placed.  Taken to OR.  Placed under general anesthesia.  Placed prone. All pressure points padded.  Time out.  Antibiotics administered.  Sterile pre and drape.  t12-l1 interspace identified under flouroscopy.  Approximate 2 inch linear region anesthetized with 0.25% bupivicaine.  Incision made.  Dissection to supraspinous ligament.  Given space appearance combined with required depth of entry, epimed utilized.   Epidural space accessed with loss of resistance to air with flouroscopic guideance.  Lead threaded to Top of T8, just to left of midline, second lead placed in an identical fashion, just to the right of mid line.  AP and lateral flouroscopic views demostrated appropriate placement.  Needles and stylettes removed.  Lead position stabled.  Each lead anchored with Stay Fix device.  Final placement confirmed with no migration.   Attention directed to left flank.  An approximate 2 inch linear region anesthetized with 0.25% bupiviciane.  Incision made.  Pocket created.  Leads tunneled from midline to flank using tunneling device.  Secured to battery.  All paremeters appropriate.  Each pocket irrigated.  Hemostasis achieved.  Each pocket closed with series of 2-0 vicryls followed by staples.  Sterile dressing applied.  Complications: None  Fluids: Per anesthesia  EBL: minimal  Implants: [employer] spinal cord stimulator leads (8 contact) X 2, wave Research scientist (physical sciences)  Disposition: PACU, then home.  Clinic follow up in 5 days.

## 2020-12-01 NOTE — Anesthesia Procedure Notes (Signed)
Procedure Name: Intubation Date/Time: 12/01/2020 3:31 PM Performed by: Inda Coke, CRNA Pre-anesthesia Checklist: Patient identified, Emergency Drugs available, Suction available and Patient being monitored Patient Re-evaluated:Patient Re-evaluated prior to induction Oxygen Delivery Method: Circle System Utilized Preoxygenation: Pre-oxygenation with 100% oxygen Induction Type: IV induction and Rapid sequence Ventilation: Mask ventilation without difficulty and Oral airway inserted - appropriate to patient size Laryngoscope Size: Mac and 4 Grade View: Grade I Tube type: Oral Tube size: 7.5 mm Number of attempts: 1 Airway Equipment and Method: Stylet and Oral airway Placement Confirmation: ETT inserted through vocal cords under direct vision,  positive ETCO2 and breath sounds checked- equal and bilateral Secured at: 23 cm Tube secured with: Tape Dental Injury: Teeth and Oropharynx as per pre-operative assessment

## 2020-12-01 NOTE — Anesthesia Preprocedure Evaluation (Addendum)
Anesthesia Evaluation  Patient identified by MRN, date of birth, ID band Patient awake    Reviewed: Allergy & Precautions, NPO status , Patient's Chart, lab work & pertinent test results  Airway Mallampati: III  TM Distance: >3 FB Neck ROM: Full    Dental no notable dental hx.    Pulmonary asthma (childhood) , sleep apnea ,    Pulmonary exam normal breath sounds clear to auscultation       Cardiovascular hypertension, Pt. on medications and Pt. on home beta blockers Normal cardiovascular exam Rhythm:Regular Rate:Normal  ECG: SR, 1st degree AV block, RBBB   Neuro/Psych PSYCHIATRIC DISORDERS Anxiety Depression TIA   GI/Hepatic negative GI ROS, (+)     substance abuse  ,   Endo/Other  Morbid obesity  Renal/GU negative Renal ROS     Musculoskeletal  (+) Arthritis , narcotic dependent  Abdominal   Peds  Hematology HLD   Anesthesia Other Findings Spinal stenosis, Lumbar region with neurogenic claudication  Reproductive/Obstetrics                            Anesthesia Physical Anesthesia Plan  ASA: III  Anesthesia Plan: General   Post-op Pain Management:    Induction: Intravenous  PONV Risk Score and Plan: 2 and Ondansetron, Dexamethasone, Midazolam and Treatment may vary due to age or medical condition  Airway Management Planned: Oral ETT  Additional Equipment:   Intra-op Plan:   Post-operative Plan: Extubation in OR  Informed Consent: I have reviewed the patients History and Physical, chart, labs and discussed the procedure including the risks, benefits and alternatives for the proposed anesthesia with the patient or authorized representative who has indicated his/her understanding and acceptance.     Dental advisory given  Plan Discussed with: CRNA  Anesthesia Plan Comments:         Anesthesia Quick Evaluation

## 2020-12-03 ENCOUNTER — Encounter (HOSPITAL_COMMUNITY): Payer: Self-pay | Admitting: Pain Medicine

## 2021-01-04 NOTE — H&P (Signed)
Here for permanent spinal cord stimulator implant, see clinic notes for further details.  Over 50% benefit from trial, pain has returned to over 5/10.  ROS neg except as note in chart  FH [place]  Soc non smoker  Med: reviewed  ALL: NKDA  PE:  Obese Alert Regular Breathing pattern regular pulse rate Good strength in lower extremities throughout  No sensory deficits soft touch   Assessment/plan  pleasant gentle with chronic pain syndrome, lumbar radiculopathy presents for permanent spinal cord stimulator implant

## 2021-01-25 DIAGNOSIS — N529 Male erectile dysfunction, unspecified: Secondary | ICD-10-CM | POA: Diagnosis not present

## 2021-01-25 DIAGNOSIS — F39 Unspecified mood [affective] disorder: Secondary | ICD-10-CM | POA: Diagnosis not present

## 2021-01-25 DIAGNOSIS — L209 Atopic dermatitis, unspecified: Secondary | ICD-10-CM | POA: Diagnosis not present

## 2021-01-25 DIAGNOSIS — R319 Hematuria, unspecified: Secondary | ICD-10-CM | POA: Diagnosis not present

## 2021-01-25 DIAGNOSIS — I10 Essential (primary) hypertension: Secondary | ICD-10-CM | POA: Diagnosis not present

## 2021-01-25 DIAGNOSIS — R300 Dysuria: Secondary | ICD-10-CM | POA: Diagnosis not present

## 2021-01-25 DIAGNOSIS — N3001 Acute cystitis with hematuria: Secondary | ICD-10-CM | POA: Diagnosis not present

## 2021-01-25 DIAGNOSIS — Z0001 Encounter for general adult medical examination with abnormal findings: Secondary | ICD-10-CM | POA: Diagnosis not present

## 2021-01-25 DIAGNOSIS — Z6841 Body Mass Index (BMI) 40.0 and over, adult: Secondary | ICD-10-CM | POA: Diagnosis not present

## 2021-01-31 DIAGNOSIS — G8929 Other chronic pain: Secondary | ICD-10-CM | POA: Diagnosis not present

## 2021-01-31 DIAGNOSIS — M5430 Sciatica, unspecified side: Secondary | ICD-10-CM | POA: Diagnosis not present

## 2021-01-31 DIAGNOSIS — E1169 Type 2 diabetes mellitus with other specified complication: Secondary | ICD-10-CM | POA: Diagnosis not present

## 2021-01-31 DIAGNOSIS — E782 Mixed hyperlipidemia: Secondary | ICD-10-CM | POA: Diagnosis not present

## 2021-01-31 DIAGNOSIS — M170 Bilateral primary osteoarthritis of knee: Secondary | ICD-10-CM | POA: Diagnosis not present

## 2021-01-31 DIAGNOSIS — M25569 Pain in unspecified knee: Secondary | ICD-10-CM | POA: Diagnosis not present

## 2021-01-31 DIAGNOSIS — F419 Anxiety disorder, unspecified: Secondary | ICD-10-CM | POA: Diagnosis not present

## 2021-01-31 DIAGNOSIS — R7303 Prediabetes: Secondary | ICD-10-CM | POA: Diagnosis not present

## 2021-01-31 DIAGNOSIS — I10 Essential (primary) hypertension: Secondary | ICD-10-CM | POA: Diagnosis not present

## 2021-02-08 DIAGNOSIS — M79676 Pain in unspecified toe(s): Secondary | ICD-10-CM | POA: Diagnosis not present

## 2021-02-08 DIAGNOSIS — B351 Tinea unguium: Secondary | ICD-10-CM | POA: Diagnosis not present

## 2021-05-03 DIAGNOSIS — I10 Essential (primary) hypertension: Secondary | ICD-10-CM | POA: Diagnosis not present

## 2021-05-03 DIAGNOSIS — E119 Type 2 diabetes mellitus without complications: Secondary | ICD-10-CM | POA: Diagnosis not present

## 2021-05-04 DIAGNOSIS — M9901 Segmental and somatic dysfunction of cervical region: Secondary | ICD-10-CM | POA: Diagnosis not present

## 2021-05-04 DIAGNOSIS — M542 Cervicalgia: Secondary | ICD-10-CM | POA: Diagnosis not present

## 2021-05-04 DIAGNOSIS — M9902 Segmental and somatic dysfunction of thoracic region: Secondary | ICD-10-CM | POA: Diagnosis not present

## 2021-05-04 DIAGNOSIS — M546 Pain in thoracic spine: Secondary | ICD-10-CM | POA: Diagnosis not present

## 2021-05-10 DIAGNOSIS — M170 Bilateral primary osteoarthritis of knee: Secondary | ICD-10-CM | POA: Diagnosis not present

## 2021-05-10 DIAGNOSIS — G894 Chronic pain syndrome: Secondary | ICD-10-CM | POA: Diagnosis not present

## 2021-05-10 DIAGNOSIS — M79676 Pain in unspecified toe(s): Secondary | ICD-10-CM | POA: Diagnosis not present

## 2021-05-10 DIAGNOSIS — M5416 Radiculopathy, lumbar region: Secondary | ICD-10-CM | POA: Diagnosis not present

## 2021-05-10 DIAGNOSIS — F411 Generalized anxiety disorder: Secondary | ICD-10-CM | POA: Diagnosis not present

## 2021-05-10 DIAGNOSIS — Z0001 Encounter for general adult medical examination with abnormal findings: Secondary | ICD-10-CM | POA: Diagnosis not present

## 2021-05-10 DIAGNOSIS — B351 Tinea unguium: Secondary | ICD-10-CM | POA: Diagnosis not present

## 2021-05-10 DIAGNOSIS — G40909 Epilepsy, unspecified, not intractable, without status epilepticus: Secondary | ICD-10-CM | POA: Diagnosis not present

## 2021-05-10 DIAGNOSIS — I10 Essential (primary) hypertension: Secondary | ICD-10-CM | POA: Diagnosis not present

## 2021-05-10 DIAGNOSIS — E118 Type 2 diabetes mellitus with unspecified complications: Secondary | ICD-10-CM | POA: Diagnosis not present

## 2021-05-13 ENCOUNTER — Other Ambulatory Visit: Payer: Self-pay

## 2021-05-13 ENCOUNTER — Ambulatory Visit: Payer: Medicare PPO | Admitting: Orthopedic Surgery

## 2021-05-13 DIAGNOSIS — M170 Bilateral primary osteoarthritis of knee: Secondary | ICD-10-CM

## 2021-05-14 ENCOUNTER — Encounter: Payer: Self-pay | Admitting: Orthopedic Surgery

## 2021-05-14 DIAGNOSIS — M170 Bilateral primary osteoarthritis of knee: Secondary | ICD-10-CM | POA: Diagnosis not present

## 2021-05-14 MED ORDER — METHYLPREDNISOLONE ACETATE 40 MG/ML IJ SUSP
40.0000 mg | INTRAMUSCULAR | Status: AC | PRN
  Administered 2021-05-14: 40 mg via INTRA_ARTICULAR

## 2021-05-14 MED ORDER — LIDOCAINE HCL 1 % IJ SOLN
5.0000 mL | INTRAMUSCULAR | Status: AC | PRN
  Administered 2021-05-14: 5 mL

## 2021-05-14 MED ORDER — BUPIVACAINE HCL 0.25 % IJ SOLN
4.0000 mL | INTRAMUSCULAR | Status: AC | PRN
Start: 2021-05-14 — End: 2021-05-14
  Administered 2021-05-14: 4 mL via INTRA_ARTICULAR

## 2021-05-14 MED ORDER — BUPIVACAINE HCL 0.25 % IJ SOLN
4.0000 mL | INTRAMUSCULAR | Status: AC | PRN
  Administered 2021-05-14: 4 mL via INTRA_ARTICULAR

## 2021-05-14 NOTE — Progress Notes (Signed)
Office Visit Note   Patient: Seth [name]           Date of Birth: 06-Mar-1956           MRN: [id number] Visit Date: 05/13/2021 Requested by: Celene Squibb, MD [place],  [place] [address] PCP: Celene Squibb, MD  Subjective: Chief Complaint  Patient presents with   Left Knee - Pain   Right Knee - Pain    HPI: Seth [name] is a 65 year old patient with bilateral knee pain and known bilateral knee arthritis.  He has increased body mass index.  Last injection was many months ago.  He did relatively well with that injection into both knees.  Denies any interval history of injury but does report continued pain with ambulation.              ROS: All systems reviewed are negative as they relate to the chief complaint within the history of present illness.  Patient denies  fevers or chills.   Assessment & Plan: Visit Diagnoses: No diagnosis found.  Plan: Impression is bilateral knee arthritis.  Bilateral cortisone injection performed today per patient request.  Not too much effusion in either knee.  Left knee has slightly more flexion contracture than the right.  Anticipate continued injections on a as needed basis.  Follow-up as needed.  Follow-Up Instructions: Return if symptoms worsen or fail to improve.   Orders:  No orders of the defined types were placed in this encounter.  No orders of the defined types were placed in this encounter.     Procedures: Large Joint Inj: R knee on 05/14/2021 6:33 PM Indications: diagnostic evaluation, joint swelling and pain Details: 18 G 1.5 in needle, superolateral approach  Arthrogram: No  Medications: 5 mL lidocaine 1 %; 40 mg methylPREDNISolone acetate 40 MG/ML; 4 mL bupivacaine 0.25 % Outcome: tolerated well, no immediate complications Procedure, treatment alternatives, risks and benefits explained, specific risks discussed. Consent was given by the patient. Immediately prior to procedure a time out was called to verify the  correct patient, procedure, equipment, support staff and site/side marked as required. Patient was prepped and draped in the usual sterile fashion.    Large Joint Inj: L knee on 05/14/2021 6:33 PM Indications: diagnostic evaluation, joint swelling and pain Details: 18 G 1.5 in needle, superolateral approach  Arthrogram: No  Medications: 5 mL lidocaine 1 %; 40 mg methylPREDNISolone acetate 40 MG/ML; 4 mL bupivacaine 0.25 % Outcome: tolerated well, no immediate complications Procedure, treatment alternatives, risks and benefits explained, specific risks discussed. Consent was given by the patient. Immediately prior to procedure a time out was called to verify the correct patient, procedure, equipment, support staff and site/side marked as required. Patient was prepped and draped in the usual sterile fashion.      Clinical Data: No additional findings.  Objective: Vital Signs: There were no vitals taken for this visit.  Physical Exam:   Constitutional: Patient appears well-developed HEENT:  Head: Normocephalic Eyes:EOM are normal Neck: Normal range of motion Cardiovascular: Normal rate Pulmonary/chest: Effort normal Neurologic: Patient is alert Skin: Skin is warm Psychiatric: Patient has normal mood and affect   Ortho Exam: Ortho exam demonstrates palpable pedal pulses with not too much pitting edema in bilateral lower extremities.  Some venous stasis changes are present around the ankles.  Flexion contracture of 8 degrees in the left knee 2 to 3 degrees in the right knee.  Collateral crucial ligaments are stable.  Extensor  mechanism is intact.  No groin pain with internal ex rotation of the leg.  No other masses lymphadenopathy or skin changes noted in bilateral knee region.  No effusion in either knee.  Specialty Comments:  No specialty comments available.  Imaging: No results found.   PMFS History: Patient Active Problem List   Diagnosis Date Noted   Screening for  colorectal cancer 09/02/2020   Knee pain 07/15/2020   Sciatica of right side 04/19/2017   TIA (transient ischemic attack) 04/19/2017   Adjustment disorder with anxious mood 03/23/2016   Opioid type dependence, continuous ([place]) 12/08/2015   Seizure prophylaxis 10/13/2015   Allergic rhinitis 12/17/2014   Morbid obesity ([place]) 05/06/2014   Anxiety 07/17/2012   Depression 07/17/2012   Essential hypertension 07/17/2012   Intraparenchymal hemorrhage of brain ([place]) 07/16/2012   Past Medical History:  Diagnosis Date   Anxiety    Brain bleed ([place])    Intracerebral bleed - [employer] 2013   Bronchitis    Depression    History of kidney stones    Hypertension    Osteoarthritis of both knees    Pre-diabetes    Sleep apnea    dx. at one time, then they said no- mild, no cpap   Transient ischemic attack ([facility])     Family History  Problem Relation Age of Onset   Emphysema Mother    Lung cancer Father    Heart attack Brother    Colon cancer Neg Hx    Colon polyps Neg Hx     Past Surgical History:  Procedure Laterality Date   ACHILLES TENDON SURGERY Right 1994   BARIATRIC SURGERY     2005   cataracts Bilateral 2008   CHOLECYSTECTOMY     COLONOSCOPY  2015   Outside facility:  medium-sized lipoma in transverse colon.    EXTRACORPOREAL SHOCK WAVE LITHOTRIPSY Left 06/13/2018   Procedure: LEFT EXTRACORPOREAL SHOCK WAVE LITHOTRIPSY (ESWL) WITH MAC;  Surgeon: Irine Seal, MD;  Location: [facility];  Service: Urology;  Laterality: Left;   repair of wrist fx Right    SPINAL CORD STIMULATOR INSERTION N/A 12/01/2020   Procedure: Lumbar spinal cord stimulator placement;  Surgeon: Reece Agar, MD;  Location: [facility];  Service: Neurosurgery;  Laterality: N/A;   TONSILLECTOMY     Social History   Occupational History   Not on file  Tobacco Use   Smoking status: Never   Smokeless tobacco: Never  Vaping Use   Vaping Use: Never used  Substance and Sexual Activity   Alcohol use: Not  Currently   Drug use: Never   Sexual activity: Not on file

## 2021-07-27 DIAGNOSIS — E118 Type 2 diabetes mellitus with unspecified complications: Secondary | ICD-10-CM | POA: Diagnosis not present

## 2021-07-27 DIAGNOSIS — M5416 Radiculopathy, lumbar region: Secondary | ICD-10-CM | POA: Diagnosis not present

## 2021-07-27 DIAGNOSIS — F411 Generalized anxiety disorder: Secondary | ICD-10-CM | POA: Diagnosis not present

## 2021-07-27 DIAGNOSIS — Z0001 Encounter for general adult medical examination with abnormal findings: Secondary | ICD-10-CM | POA: Diagnosis not present

## 2021-07-27 DIAGNOSIS — Z23 Encounter for immunization: Secondary | ICD-10-CM | POA: Diagnosis not present

## 2021-07-27 DIAGNOSIS — M170 Bilateral primary osteoarthritis of knee: Secondary | ICD-10-CM | POA: Diagnosis not present

## 2021-07-27 DIAGNOSIS — G894 Chronic pain syndrome: Secondary | ICD-10-CM | POA: Diagnosis not present

## 2021-07-27 DIAGNOSIS — G40909 Epilepsy, unspecified, not intractable, without status epilepticus: Secondary | ICD-10-CM | POA: Diagnosis not present

## 2021-08-11 ENCOUNTER — Ambulatory Visit
Admission: EM | Admit: 2021-08-11 | Discharge: 2021-08-11 | Disposition: A | Discharge status: Home / Self Care | Payer: Medicare PPO | Attending: Family Medicine | Admitting: Family Medicine

## 2021-08-11 ENCOUNTER — Other Ambulatory Visit: Payer: Self-pay

## 2021-08-11 DIAGNOSIS — J0100 Acute maxillary sinusitis, unspecified: Secondary | ICD-10-CM

## 2021-08-11 MED ORDER — AZITHROMYCIN 250 MG PO TABS: 250.0000 mg | ORAL_TABLET | Freq: Every day | ORAL | 0 refills | Status: DC

## 2021-08-11 NOTE — ED Provider Notes (Signed)
[facility]   [id number] 08/11/21 Arrival Time: 1116  ASSESSMENT & PLAN:  1. Acute non-recurrent maxillary sinusitis    H/O sinus infections responding well to Zithromax per pt. Begin: Meds ordered this encounter  Medications   azithromycin (ZITHROMAX) 250 MG tablet    Sig: Take 1 tablet (250 mg total) by mouth daily. Take first 2 tablets together, then 1 every day until finished.    Dispense:  6 tablet    Refill:  0   Discussed typical duration of symptoms. OTC symptom care as needed. Ensure adequate fluid intake and rest.   Follow-up Information     Benita Stabile, MD.   Specialty: Internal Medicine Why: If worsening or failing to improve as anticipated. Contact information: [address] Rosanne Gutting [facility] [address] [phone]                 Reviewed expectations re: course of current medical issues. Questions answered. Outlined signs and symptoms indicating need for more acute intervention. Patient verbalized understanding. After Visit Summary given.   SUBJECTIVE: History from: patient.  Seth [name] is a 65 y.o. male who presents with complaint of nasal congestion, post-nasal drainage, and sinus pain. Unsure of exact duration. Reports h/o sinus infection every autumn. Afebrile. No resp symptoms. Social History   Tobacco Use  Smoking Status Never  Smokeless Tobacco Never    OBJECTIVE:  Vitals:   08/11/21 1234  BP: 101/69  Pulse: 73  Resp: 18  Temp: 97.9 F (36.6 C)  TempSrc: Oral  SpO2: 96%     General appearance: alert; no distress HEENT: nasal congestion Neck: supple without LAD; trachea midline Lungs: unlabored respirations; no respiratory distress Skin: warm and dry Psychological: alert and cooperative; normal mood and affect  Allergies  Allergen Reactions   Latex Other (See Comments)   Sulfa Antibiotics Other (See Comments)   Amoxicillin-Pot Clavulanate Diarrhea and Nausea Only   Tape Rash    Past Medical  History:  Diagnosis Date   Anxiety    Brain bleed (HCC)    Intracerebral bleed - [facility] 2013   Bronchitis    Depression    History of kidney stones    Hypertension    Osteoarthritis of both knees    Pre-diabetes    Sleep apnea    dx. at one time, then they said no- mild, no cpap   Transient ischemic attack (TIA)    Family History  Problem Relation Age of Onset   Emphysema Mother    Lung cancer Father    Heart attack Brother    Colon cancer Neg Hx    Colon polyps Neg Hx    Social History   Socioeconomic History   Marital status: Single    Spouse name: Not on file   Number of children: Not on file   Years of education: Not on file   Highest education level: Not on file  Occupational History   Not on file  Tobacco Use   Smoking status: Never   Smokeless tobacco: Never  Vaping Use   Vaping Use: Never used  Substance and Sexual Activity   Alcohol use: Not Currently   Drug use: Never   Sexual activity: Not on file  Other Topics Concern   Not on file  Social History Narrative   Not on file   Social Determinants of Health   Financial Resource Strain: Not on file  Food Insecurity: Not on file  Transportation Needs: Not on file  Physical Activity: Not on file  Stress: Not on file  Social Connections: Not on file  Intimate Partner Violence: Not on file             Mardella Layman, MD 08/11/21 1418

## 2021-08-11 NOTE — ED Triage Notes (Signed)
Patient states he has yellow mucus coming out of nose, headache, and congestion with a few aches. He thinks he has a sinus infection. Does not want to be tested for covid or Flu.  Denies Fever.

## 2021-08-12 DIAGNOSIS — E782 Mixed hyperlipidemia: Secondary | ICD-10-CM | POA: Diagnosis not present

## 2021-08-12 DIAGNOSIS — E118 Type 2 diabetes mellitus with unspecified complications: Secondary | ICD-10-CM | POA: Diagnosis not present

## 2021-08-17 DIAGNOSIS — M5416 Radiculopathy, lumbar region: Secondary | ICD-10-CM | POA: Diagnosis not present

## 2021-08-17 DIAGNOSIS — E118 Type 2 diabetes mellitus with unspecified complications: Secondary | ICD-10-CM | POA: Diagnosis not present

## 2021-08-17 DIAGNOSIS — N3281 Overactive bladder: Secondary | ICD-10-CM | POA: Diagnosis not present

## 2021-08-17 DIAGNOSIS — G894 Chronic pain syndrome: Secondary | ICD-10-CM | POA: Diagnosis not present

## 2021-08-17 DIAGNOSIS — F411 Generalized anxiety disorder: Secondary | ICD-10-CM | POA: Diagnosis not present

## 2021-08-17 DIAGNOSIS — M170 Bilateral primary osteoarthritis of knee: Secondary | ICD-10-CM | POA: Diagnosis not present

## 2021-08-17 DIAGNOSIS — I10 Essential (primary) hypertension: Secondary | ICD-10-CM | POA: Diagnosis not present

## 2021-08-17 DIAGNOSIS — G40909 Epilepsy, unspecified, not intractable, without status epilepticus: Secondary | ICD-10-CM | POA: Diagnosis not present

## 2021-08-30 DIAGNOSIS — B351 Tinea unguium: Secondary | ICD-10-CM | POA: Diagnosis not present

## 2021-08-30 DIAGNOSIS — M79676 Pain in unspecified toe(s): Secondary | ICD-10-CM | POA: Diagnosis not present

## 2021-08-31 DIAGNOSIS — M170 Bilateral primary osteoarthritis of knee: Secondary | ICD-10-CM | POA: Diagnosis not present

## 2021-08-31 DIAGNOSIS — M1711 Unilateral primary osteoarthritis, right knee: Secondary | ICD-10-CM | POA: Diagnosis not present

## 2021-08-31 DIAGNOSIS — M1712 Unilateral primary osteoarthritis, left knee: Secondary | ICD-10-CM | POA: Diagnosis not present

## 2021-09-07 DIAGNOSIS — M1711 Unilateral primary osteoarthritis, right knee: Secondary | ICD-10-CM | POA: Diagnosis not present

## 2021-09-07 DIAGNOSIS — M2351 Chronic instability of knee, right knee: Secondary | ICD-10-CM | POA: Diagnosis not present

## 2021-09-07 DIAGNOSIS — M25561 Pain in right knee: Secondary | ICD-10-CM | POA: Diagnosis not present

## 2021-09-07 DIAGNOSIS — M25761 Osteophyte, right knee: Secondary | ICD-10-CM | POA: Diagnosis not present

## 2021-09-13 DIAGNOSIS — M25762 Osteophyte, left knee: Secondary | ICD-10-CM | POA: Diagnosis not present

## 2021-09-13 DIAGNOSIS — M1712 Unilateral primary osteoarthritis, left knee: Secondary | ICD-10-CM | POA: Diagnosis not present

## 2021-09-13 DIAGNOSIS — M2352 Chronic instability of knee, left knee: Secondary | ICD-10-CM | POA: Diagnosis not present

## 2021-09-13 DIAGNOSIS — M25562 Pain in left knee: Secondary | ICD-10-CM | POA: Diagnosis not present

## 2021-09-22 DIAGNOSIS — M1711 Unilateral primary osteoarthritis, right knee: Secondary | ICD-10-CM | POA: Diagnosis not present

## 2021-09-22 DIAGNOSIS — M1611 Unilateral primary osteoarthritis, right hip: Secondary | ICD-10-CM | POA: Diagnosis not present

## 2021-10-07 IMAGING — RF DG C-ARM 1-60 MIN
1 series · 2 of 2 positions shown · non-contrast
Comparison: None.

CLINICAL DATA: Lumbar spinal cord placement.

EXAM:
DG C-ARM 1-60 MIN; LUMBAR SPINE - 2-3 VIEW
FLUOROSCOPY TIME:  Fluoroscopy Time:  3 minutes and 38 seconds
Radiation Exposure Index (if provided by the fluoroscopic device):
254.06 mGy
Number of Acquired Spot Images: 2

[Series 1: run · 2 of 2 slices shown]
[im 1/2]
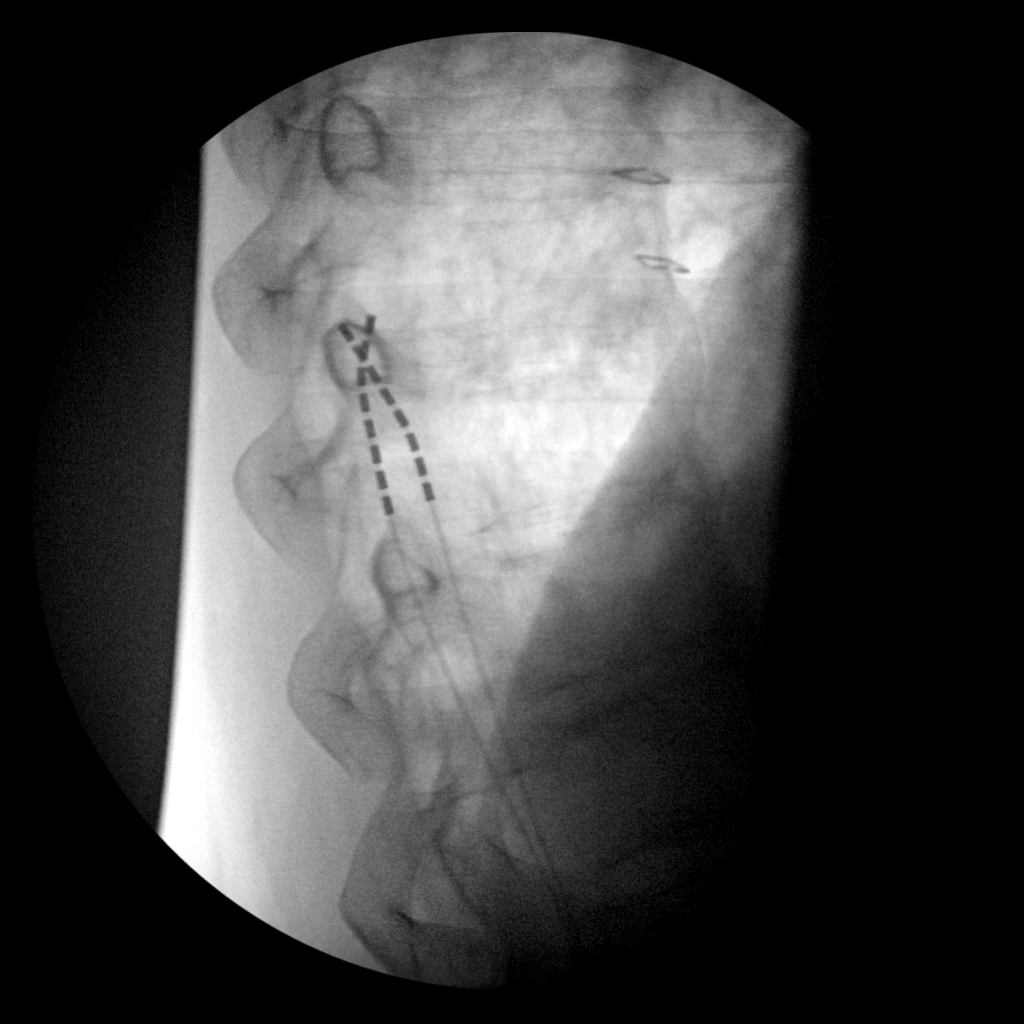
[im 2/2]
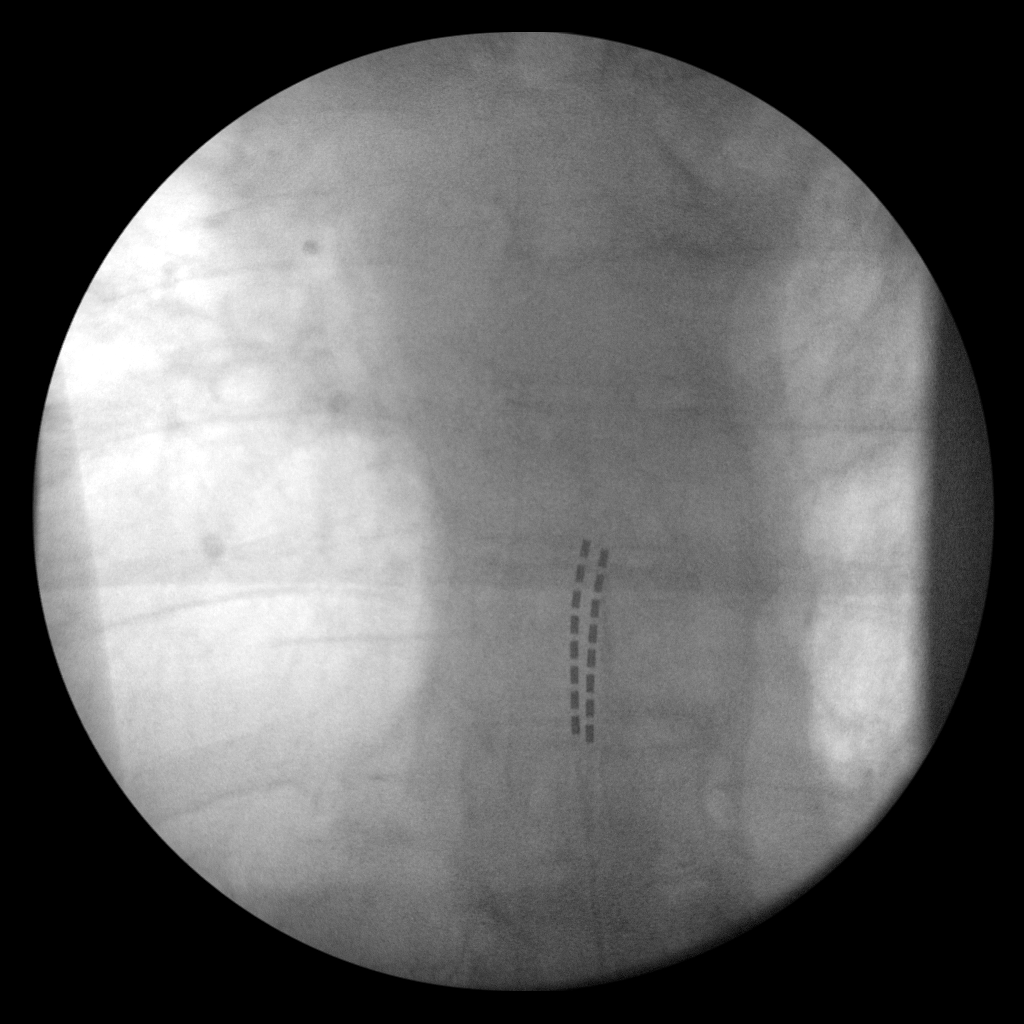

[2 of 2 positions shown; findings below may reference images not displayed]

FINDINGS: Two C-arm fluoroscopic images were obtained intraoperatively and
submitted for post operative interpretation. These demonstrate
spinal cord stimulator lead tips projecting in the expected location
of the dorsal epidural canal in the thoracic spine. The exact level
of the leads is difficult to discern given the small field of view.
Please see the performing provider's procedural report for further
detail.
IMPRESSION: Intraoperative fluoroscopy, as detailed above.

## 2021-11-07 DIAGNOSIS — M9901 Segmental and somatic dysfunction of cervical region: Secondary | ICD-10-CM | POA: Diagnosis not present

## 2021-11-07 DIAGNOSIS — M546 Pain in thoracic spine: Secondary | ICD-10-CM | POA: Diagnosis not present

## 2021-11-07 DIAGNOSIS — M542 Cervicalgia: Secondary | ICD-10-CM | POA: Diagnosis not present

## 2021-11-07 DIAGNOSIS — M9902 Segmental and somatic dysfunction of thoracic region: Secondary | ICD-10-CM | POA: Diagnosis not present

## 2021-11-21 DIAGNOSIS — E782 Mixed hyperlipidemia: Secondary | ICD-10-CM | POA: Diagnosis not present

## 2021-11-21 DIAGNOSIS — E118 Type 2 diabetes mellitus with unspecified complications: Secondary | ICD-10-CM | POA: Diagnosis not present

## 2021-11-24 DIAGNOSIS — G40909 Epilepsy, unspecified, not intractable, without status epilepticus: Secondary | ICD-10-CM | POA: Diagnosis not present

## 2021-11-24 DIAGNOSIS — M5416 Radiculopathy, lumbar region: Secondary | ICD-10-CM | POA: Diagnosis not present

## 2021-11-24 DIAGNOSIS — G894 Chronic pain syndrome: Secondary | ICD-10-CM | POA: Diagnosis not present

## 2021-11-24 DIAGNOSIS — N3281 Overactive bladder: Secondary | ICD-10-CM | POA: Diagnosis not present

## 2021-11-24 DIAGNOSIS — I10 Essential (primary) hypertension: Secondary | ICD-10-CM | POA: Diagnosis not present

## 2021-11-24 DIAGNOSIS — F411 Generalized anxiety disorder: Secondary | ICD-10-CM | POA: Diagnosis not present

## 2021-11-24 DIAGNOSIS — E118 Type 2 diabetes mellitus with unspecified complications: Secondary | ICD-10-CM | POA: Diagnosis not present

## 2021-11-24 DIAGNOSIS — M170 Bilateral primary osteoarthritis of knee: Secondary | ICD-10-CM | POA: Diagnosis not present

## 2021-11-29 DIAGNOSIS — M79676 Pain in unspecified toe(s): Secondary | ICD-10-CM | POA: Diagnosis not present

## 2021-11-29 DIAGNOSIS — B351 Tinea unguium: Secondary | ICD-10-CM | POA: Diagnosis not present

## 2022-01-25 DIAGNOSIS — M9902 Segmental and somatic dysfunction of thoracic region: Secondary | ICD-10-CM | POA: Diagnosis not present

## 2022-01-25 DIAGNOSIS — M9901 Segmental and somatic dysfunction of cervical region: Secondary | ICD-10-CM | POA: Diagnosis not present

## 2022-01-25 DIAGNOSIS — M9903 Segmental and somatic dysfunction of lumbar region: Secondary | ICD-10-CM | POA: Diagnosis not present

## 2022-01-25 DIAGNOSIS — M546 Pain in thoracic spine: Secondary | ICD-10-CM | POA: Diagnosis not present

## 2022-01-25 DIAGNOSIS — M542 Cervicalgia: Secondary | ICD-10-CM | POA: Diagnosis not present

## 2022-02-23 DIAGNOSIS — R350 Frequency of micturition: Secondary | ICD-10-CM | POA: Diagnosis not present

## 2022-02-23 DIAGNOSIS — E118 Type 2 diabetes mellitus with unspecified complications: Secondary | ICD-10-CM | POA: Diagnosis not present

## 2022-02-23 DIAGNOSIS — E782 Mixed hyperlipidemia: Secondary | ICD-10-CM | POA: Diagnosis not present

## 2022-03-01 DIAGNOSIS — I10 Essential (primary) hypertension: Secondary | ICD-10-CM | POA: Diagnosis not present

## 2022-03-01 DIAGNOSIS — M170 Bilateral primary osteoarthritis of knee: Secondary | ICD-10-CM | POA: Diagnosis not present

## 2022-03-01 DIAGNOSIS — G40909 Epilepsy, unspecified, not intractable, without status epilepticus: Secondary | ICD-10-CM | POA: Diagnosis not present

## 2022-03-01 DIAGNOSIS — F411 Generalized anxiety disorder: Secondary | ICD-10-CM | POA: Diagnosis not present

## 2022-03-01 DIAGNOSIS — N3281 Overactive bladder: Secondary | ICD-10-CM | POA: Diagnosis not present

## 2022-03-01 DIAGNOSIS — M5416 Radiculopathy, lumbar region: Secondary | ICD-10-CM | POA: Diagnosis not present

## 2022-03-01 DIAGNOSIS — E118 Type 2 diabetes mellitus with unspecified complications: Secondary | ICD-10-CM | POA: Diagnosis not present

## 2022-03-01 DIAGNOSIS — G894 Chronic pain syndrome: Secondary | ICD-10-CM | POA: Diagnosis not present

## 2022-03-07 DIAGNOSIS — B351 Tinea unguium: Secondary | ICD-10-CM | POA: Diagnosis not present

## 2022-03-07 DIAGNOSIS — M79676 Pain in unspecified toe(s): Secondary | ICD-10-CM | POA: Diagnosis not present

## 2022-03-07 DIAGNOSIS — B353 Tinea pedis: Secondary | ICD-10-CM | POA: Diagnosis not present

## 2022-05-03 DIAGNOSIS — N3281 Overactive bladder: Secondary | ICD-10-CM | POA: Diagnosis not present

## 2022-05-03 DIAGNOSIS — E13628 Other specified diabetes mellitus with other skin complications: Secondary | ICD-10-CM | POA: Diagnosis not present

## 2022-05-03 DIAGNOSIS — Z6841 Body Mass Index (BMI) 40.0 and over, adult: Secondary | ICD-10-CM | POA: Diagnosis not present

## 2022-05-03 DIAGNOSIS — G894 Chronic pain syndrome: Secondary | ICD-10-CM | POA: Diagnosis not present

## 2022-05-03 DIAGNOSIS — B351 Tinea unguium: Secondary | ICD-10-CM | POA: Diagnosis not present

## 2022-05-03 DIAGNOSIS — F411 Generalized anxiety disorder: Secondary | ICD-10-CM | POA: Diagnosis not present

## 2022-05-30 DIAGNOSIS — E782 Mixed hyperlipidemia: Secondary | ICD-10-CM | POA: Diagnosis not present

## 2022-05-30 DIAGNOSIS — M25561 Pain in right knee: Secondary | ICD-10-CM | POA: Diagnosis not present

## 2022-05-30 DIAGNOSIS — M25562 Pain in left knee: Secondary | ICD-10-CM | POA: Diagnosis not present

## 2022-05-30 DIAGNOSIS — E118 Type 2 diabetes mellitus with unspecified complications: Secondary | ICD-10-CM | POA: Diagnosis not present

## 2022-05-30 DIAGNOSIS — M170 Bilateral primary osteoarthritis of knee: Secondary | ICD-10-CM | POA: Diagnosis not present

## 2022-06-07 DIAGNOSIS — E118 Type 2 diabetes mellitus with unspecified complications: Secondary | ICD-10-CM | POA: Diagnosis not present

## 2022-06-07 DIAGNOSIS — M170 Bilateral primary osteoarthritis of knee: Secondary | ICD-10-CM | POA: Diagnosis not present

## 2022-06-07 DIAGNOSIS — M5416 Radiculopathy, lumbar region: Secondary | ICD-10-CM | POA: Diagnosis not present

## 2022-06-07 DIAGNOSIS — E782 Mixed hyperlipidemia: Secondary | ICD-10-CM | POA: Diagnosis not present

## 2022-06-07 DIAGNOSIS — G40909 Epilepsy, unspecified, not intractable, without status epilepticus: Secondary | ICD-10-CM | POA: Diagnosis not present

## 2022-06-07 DIAGNOSIS — Z0001 Encounter for general adult medical examination with abnormal findings: Secondary | ICD-10-CM | POA: Diagnosis not present

## 2022-06-07 DIAGNOSIS — F411 Generalized anxiety disorder: Secondary | ICD-10-CM | POA: Diagnosis not present

## 2022-06-07 DIAGNOSIS — G894 Chronic pain syndrome: Secondary | ICD-10-CM | POA: Diagnosis not present

## 2022-06-14 DIAGNOSIS — Z1211 Encounter for screening for malignant neoplasm of colon: Secondary | ICD-10-CM | POA: Diagnosis not present

## 2022-06-19 LAB — COLOGUARD: COLOGUARD: NEGATIVE

## 2022-06-20 DIAGNOSIS — M79676 Pain in unspecified toe(s): Secondary | ICD-10-CM | POA: Diagnosis not present

## 2022-06-20 DIAGNOSIS — B351 Tinea unguium: Secondary | ICD-10-CM | POA: Diagnosis not present

## 2022-07-27 DIAGNOSIS — M170 Bilateral primary osteoarthritis of knee: Secondary | ICD-10-CM | POA: Diagnosis not present

## 2022-08-18 DIAGNOSIS — M170 Bilateral primary osteoarthritis of knee: Secondary | ICD-10-CM | POA: Diagnosis not present

## 2022-08-23 DIAGNOSIS — M170 Bilateral primary osteoarthritis of knee: Secondary | ICD-10-CM | POA: Diagnosis not present

## 2022-08-29 DIAGNOSIS — M79676 Pain in unspecified toe(s): Secondary | ICD-10-CM | POA: Diagnosis not present

## 2022-08-29 DIAGNOSIS — B351 Tinea unguium: Secondary | ICD-10-CM | POA: Diagnosis not present

## 2022-09-11 DIAGNOSIS — E782 Mixed hyperlipidemia: Secondary | ICD-10-CM | POA: Diagnosis not present

## 2022-09-11 DIAGNOSIS — E118 Type 2 diabetes mellitus with unspecified complications: Secondary | ICD-10-CM | POA: Diagnosis not present

## 2022-09-15 DIAGNOSIS — E118 Type 2 diabetes mellitus with unspecified complications: Secondary | ICD-10-CM | POA: Diagnosis not present

## 2022-09-15 DIAGNOSIS — M170 Bilateral primary osteoarthritis of knee: Secondary | ICD-10-CM | POA: Diagnosis not present

## 2022-09-15 DIAGNOSIS — G40909 Epilepsy, unspecified, not intractable, without status epilepticus: Secondary | ICD-10-CM | POA: Diagnosis not present

## 2022-09-15 DIAGNOSIS — E782 Mixed hyperlipidemia: Secondary | ICD-10-CM | POA: Diagnosis not present

## 2022-09-15 DIAGNOSIS — M5416 Radiculopathy, lumbar region: Secondary | ICD-10-CM | POA: Diagnosis not present

## 2022-09-15 DIAGNOSIS — G894 Chronic pain syndrome: Secondary | ICD-10-CM | POA: Diagnosis not present

## 2022-09-15 DIAGNOSIS — F411 Generalized anxiety disorder: Secondary | ICD-10-CM | POA: Diagnosis not present

## 2022-09-15 DIAGNOSIS — F32A Depression, unspecified: Secondary | ICD-10-CM | POA: Diagnosis not present

## 2022-09-20 ENCOUNTER — Other Ambulatory Visit (HOSPITAL_COMMUNITY): Payer: Self-pay | Admitting: Internal Medicine

## 2022-09-20 DIAGNOSIS — I10 Essential (primary) hypertension: Secondary | ICD-10-CM | POA: Diagnosis not present

## 2022-09-20 DIAGNOSIS — Z79899 Other long term (current) drug therapy: Secondary | ICD-10-CM | POA: Diagnosis not present

## 2022-09-20 DIAGNOSIS — Z7982 Long term (current) use of aspirin: Secondary | ICD-10-CM | POA: Diagnosis not present

## 2022-09-20 DIAGNOSIS — G40909 Epilepsy, unspecified, not intractable, without status epilepticus: Secondary | ICD-10-CM | POA: Diagnosis not present

## 2022-09-20 DIAGNOSIS — Z7182 Exercise counseling: Secondary | ICD-10-CM | POA: Diagnosis not present

## 2022-09-20 DIAGNOSIS — Z713 Dietary counseling and surveillance: Secondary | ICD-10-CM | POA: Diagnosis not present

## 2022-09-20 DIAGNOSIS — Z6841 Body Mass Index (BMI) 40.0 and over, adult: Secondary | ICD-10-CM | POA: Diagnosis not present

## 2022-09-21 ENCOUNTER — Encounter: Payer: Self-pay | Admitting: Neurology

## 2022-10-04 DIAGNOSIS — M170 Bilateral primary osteoarthritis of knee: Secondary | ICD-10-CM | POA: Diagnosis not present

## 2022-10-13 ENCOUNTER — Ambulatory Visit (HOSPITAL_COMMUNITY)
Admission: RE | Admit: 2022-10-13 | Discharge: 2022-10-13 | Disposition: A | Discharge status: Home / Self Care | Payer: Medicare PPO | Source: Ambulatory Visit | Attending: Internal Medicine | Admitting: Internal Medicine

## 2022-10-13 DIAGNOSIS — R569 Unspecified convulsions: Secondary | ICD-10-CM | POA: Diagnosis not present

## 2022-10-13 DIAGNOSIS — G40909 Epilepsy, unspecified, not intractable, without status epilepticus: Secondary | ICD-10-CM | POA: Insufficient documentation

## 2022-10-17 ENCOUNTER — Ambulatory Visit (HOSPITAL_COMMUNITY): Payer: Medicare PPO

## 2022-10-25 ENCOUNTER — Encounter: Payer: Self-pay | Admitting: Neurology

## 2022-10-25 ENCOUNTER — Ambulatory Visit: Payer: Medicare PPO | Admitting: Neurology

## 2022-10-25 VITALS — BP 137/90 | HR 83 | Ht 72.0 in | Wt 316.4 lb

## 2022-10-25 DIAGNOSIS — G40209 Localization-related (focal) (partial) symptomatic epilepsy and epileptic syndromes with complex partial seizures, not intractable, without status epilepticus: Secondary | ICD-10-CM | POA: Diagnosis not present

## 2022-10-25 MED ORDER — LEVETIRACETAM 1000 MG PO TABS: 1000.0000 mg | ORAL_TABLET | Freq: Two times a day (BID) | ORAL | 3 refills | Status: DC
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
## 2022-10-26 ENCOUNTER — Telehealth: Payer: Self-pay

## 2022-10-26 NOTE — Telephone Encounter (Signed)
PT MRI PA was approved PA number [id number] it is approved from 10/26/22 until 11/25/22. Scheduling called they will call the pt to get him scheduled because he has a spinal cord stimulator,

## 2022-10-31 ENCOUNTER — Ambulatory Visit: Payer: Medicare PPO | Admitting: Neurology

## 2022-10-31 DIAGNOSIS — G40209 Localization-related (focal) (partial) symptomatic epilepsy and epileptic syndromes with complex partial seizures, not intractable, without status epilepticus: Secondary | ICD-10-CM | POA: Diagnosis not present

## 2022-10-31 NOTE — Progress Notes (Unsigned)
EEG complete - results pending 

## 2022-11-01 ENCOUNTER — Telehealth: Payer: Self-pay

## 2022-11-01 NOTE — Telephone Encounter (Signed)
-----  Message from Cameron Sprang, MD sent at 11/01/2022  2:38 PM EST ----- Pls let him know the EEG is normal, however it is not like a pregnancy test that is yes or no, just a snapshot of his brain waves during the test. Continue Keppra 1000mg  BID. Has he heard from MRI if he can do it or not? Thanks

## 2022-11-01 NOTE — Procedures (Signed)
ELECTROENCEPHALOGRAM REPORT  Date of Study: 10/31/2022  Patient's Name: Seth [name] MRN: [id number] Date of Birth: 1956-06-17  Referring Provider: Dr. Ellouise Newer  Clinical History: This is a 67 year old man with history of ICH in 2013, recent episode of confusion/amnesia. EEG for classification.  Medications: Levetiracetam, Xanax, Celexa, Metoprolol, Lipitor   Technical Summary: A multichannel digital 1-hour EEG recording measured by the international 10-20 system with electrodes applied with paste and impedances below 5000 ohms performed in our laboratory with EKG monitoring in an awake and asleep patient.  Hyperventilation and photic stimulation were performed.  The digital EEG was referentially recorded, reformatted, and digitally filtered in a variety of bipolar and referential montages for optimal display.    Description: The patient is awake and asleep during the recording.  During maximal wakefulness, there is a symmetric, medium voltage 8 Hz posterior dominant rhythm that attenuates with eye opening.  The record is symmetric.  During drowsiness and sleep, there is an increase in theta slowing of the background.  Vertex waves and symmetric sleep spindles were seen. Hyperventilation and photic stimulation did not elicit any abnormalities.  There were no epileptiform discharges or electrographic seizures seen.    EKG lead was unremarkable.  Impression: This 1-hour awake and asleep EEG is normal.    Clinical Correlation: A normal EEG does not exclude a clinical diagnosis of epilepsy.  If further clinical questions remain, prolonged EEG may be helpful.  Clinical correlation is advised.   Ellouise Newer, M.D.

## 2022-11-01 NOTE — Telephone Encounter (Signed)
Pt called an informed that his EEG is normal, however it is not like a pregnancy test that is yes or no, just a snapshot of his brain waves during the test. Continue Keppra 1000mg  BID. Has he heard from MRI if he can do it or not? The hospital has not reached out to him I gave him the number to central scheduling so he can call them because they had not called him his PA has been done

## 2022-11-07 DIAGNOSIS — B351 Tinea unguium: Secondary | ICD-10-CM | POA: Diagnosis not present

## 2022-11-07 DIAGNOSIS — M79676 Pain in unspecified toe(s): Secondary | ICD-10-CM | POA: Diagnosis not present

## 2022-11-08 DIAGNOSIS — M542 Cervicalgia: Secondary | ICD-10-CM | POA: Diagnosis not present

## 2022-11-08 DIAGNOSIS — M9902 Segmental and somatic dysfunction of thoracic region: Secondary | ICD-10-CM | POA: Diagnosis not present

## 2022-11-08 DIAGNOSIS — M546 Pain in thoracic spine: Secondary | ICD-10-CM | POA: Diagnosis not present

## 2022-11-08 DIAGNOSIS — M9901 Segmental and somatic dysfunction of cervical region: Secondary | ICD-10-CM | POA: Diagnosis not present

## 2022-11-09 ENCOUNTER — Ambulatory Visit (HOSPITAL_COMMUNITY)
Admission: RE | Admit: 2022-11-09 | Discharge: 2022-11-09 | Disposition: A | Discharge status: Home / Self Care | Payer: Medicare PPO | Source: Ambulatory Visit | Attending: Neurology | Admitting: Neurology

## 2022-11-09 DIAGNOSIS — R569 Unspecified convulsions: Secondary | ICD-10-CM | POA: Diagnosis not present

## 2022-11-09 DIAGNOSIS — M5416 Radiculopathy, lumbar region: Secondary | ICD-10-CM | POA: Diagnosis not present

## 2022-11-09 DIAGNOSIS — G40209 Localization-related (focal) (partial) symptomatic epilepsy and epileptic syndromes with complex partial seizures, not intractable, without status epilepticus: Secondary | ICD-10-CM | POA: Insufficient documentation

## 2022-11-09 DIAGNOSIS — G9389 Other specified disorders of brain: Secondary | ICD-10-CM | POA: Diagnosis not present

## 2022-11-09 DIAGNOSIS — Z9689 Presence of other specified functional implants: Secondary | ICD-10-CM | POA: Diagnosis not present

## 2022-11-09 DIAGNOSIS — M48062 Spinal stenosis, lumbar region with neurogenic claudication: Secondary | ICD-10-CM | POA: Diagnosis not present

## 2022-11-09 DIAGNOSIS — G894 Chronic pain syndrome: Secondary | ICD-10-CM | POA: Diagnosis not present

## 2022-11-09 MED ORDER — GADOBUTROL 1 MMOL/ML IV SOLN
10.0000 mL | Freq: Once | INTRAVENOUS | Status: AC | PRN
  Administered 2022-11-09: 10 mL via INTRAVENOUS

## 2022-11-14 DIAGNOSIS — M170 Bilateral primary osteoarthritis of knee: Secondary | ICD-10-CM | POA: Diagnosis not present

## 2022-11-16 ENCOUNTER — Telehealth: Payer: Self-pay

## 2022-11-16 NOTE — Telephone Encounter (Signed)
Pt called informed that brain MRI did not show any new changes, no new stroke, bleed, or tumor. Pt stated that he is doing well but he stopped his naproxen he stated he noticed that it made him dizzy. He wants to know what he can take to help with the dizziness? He sees his PCP early march ,

## 2022-11-16 NOTE — Telephone Encounter (Signed)
Pt called he stated that naproxen was making him dizzy, so he stopped it, but he is still dizzy even off naproxen? Correct Dizziness can be caused by so many different reasons, such as blood pressure being high or low, or side effects from other medications. Agree with PCP eval first. Pt is going to call his PCP to get a sooner appointment and will check his bp when he has a spell

## 2022-11-16 NOTE — Telephone Encounter (Signed)
Just to confirm, the naproxen was making him dizzy, so he stopped it, but he is still dizzy even off naproxen? Dizziness can be caused by so many different reasons, such as blood pressure being high or low, or side effects from other medications. Agree with PCP eval first, thanks

## 2022-11-16 NOTE — Telephone Encounter (Signed)
-----   Message from Cameron Sprang, MD sent at 11/15/2022  3:46 PM EST ----- Pls let him know brain MRI did not show any new changes, no new stroke, bleed, or tumor. How is he doing? thanks

## 2022-11-20 ENCOUNTER — Ambulatory Visit (HOSPITAL_COMMUNITY): Payer: Medicare PPO

## 2022-12-08 ENCOUNTER — Other Ambulatory Visit (HOSPITAL_COMMUNITY): Payer: Self-pay

## 2022-12-12 ENCOUNTER — Other Ambulatory Visit (HOSPITAL_COMMUNITY): Payer: Self-pay

## 2022-12-12 MED ORDER — MOUNJARO 10 MG/0.5ML ~~LOC~~ SOAJ
10.0000 mg | SUBCUTANEOUS | 0 refills | Status: DC
  Filled 2022-12-12: qty 2, 28d supply, fill #0

## 2023-01-09 DIAGNOSIS — E118 Type 2 diabetes mellitus with unspecified complications: Secondary | ICD-10-CM | POA: Diagnosis not present

## 2023-01-09 DIAGNOSIS — E782 Mixed hyperlipidemia: Secondary | ICD-10-CM | POA: Diagnosis not present

## 2023-01-15 ENCOUNTER — Other Ambulatory Visit (HOSPITAL_COMMUNITY): Payer: Self-pay

## 2023-01-15 DIAGNOSIS — Z0000 Encounter for general adult medical examination without abnormal findings: Secondary | ICD-10-CM | POA: Diagnosis not present

## 2023-01-15 MED ORDER — MOUNJARO 10 MG/0.5ML ~~LOC~~ SOAJ
10.0000 mg | SUBCUTANEOUS | 0 refills | Status: DC
  Filled 2023-01-15: qty 2, 28d supply, fill #0

## 2023-01-16 ENCOUNTER — Other Ambulatory Visit (HOSPITAL_COMMUNITY): Payer: Self-pay

## 2023-01-16 DIAGNOSIS — E782 Mixed hyperlipidemia: Secondary | ICD-10-CM | POA: Diagnosis not present

## 2023-01-16 DIAGNOSIS — F411 Generalized anxiety disorder: Secondary | ICD-10-CM | POA: Diagnosis not present

## 2023-01-16 DIAGNOSIS — F32A Depression, unspecified: Secondary | ICD-10-CM | POA: Diagnosis not present

## 2023-01-16 DIAGNOSIS — I10 Essential (primary) hypertension: Secondary | ICD-10-CM | POA: Diagnosis not present

## 2023-01-16 DIAGNOSIS — E1169 Type 2 diabetes mellitus with other specified complication: Secondary | ICD-10-CM | POA: Diagnosis not present

## 2023-01-16 DIAGNOSIS — M5416 Radiculopathy, lumbar region: Secondary | ICD-10-CM | POA: Diagnosis not present

## 2023-01-16 DIAGNOSIS — I959 Hypotension, unspecified: Secondary | ICD-10-CM | POA: Diagnosis not present

## 2023-01-16 DIAGNOSIS — G40909 Epilepsy, unspecified, not intractable, without status epilepticus: Secondary | ICD-10-CM | POA: Diagnosis not present

## 2023-01-29 ENCOUNTER — Ambulatory Visit: Payer: Medicare PPO | Admitting: Neurology

## 2023-01-29 ENCOUNTER — Encounter: Payer: Self-pay | Admitting: Neurology

## 2023-01-29 VITALS — BP 93/67 | HR 96 | Ht 66.0 in | Wt 310.8 lb

## 2023-01-29 DIAGNOSIS — I951 Orthostatic hypotension: Secondary | ICD-10-CM | POA: Diagnosis not present

## 2023-01-29 DIAGNOSIS — G40209 Localization-related (focal) (partial) symptomatic epilepsy and epileptic syndromes with complex partial seizures, not intractable, without status epilepticus: Secondary | ICD-10-CM | POA: Diagnosis not present

## 2023-01-29 MED ORDER — LEVETIRACETAM ER 1000 MG PO TB24: ORAL_TABLET | ORAL | 11 refills | Status: DC
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
## 2023-01-30 ENCOUNTER — Telehealth: Payer: Self-pay | Admitting: Pharmacy Technician

## 2023-01-30 ENCOUNTER — Other Ambulatory Visit (HOSPITAL_COMMUNITY): Payer: Self-pay

## 2023-01-30 NOTE — Telephone Encounter (Signed)
Patient Advocate Encounter  Received notification from [facility] that prior authorization for ELEPSIA XR 1000MG  is required.   PA submitted on 4.30.24 Key [id number] Status is pending

## 2023-01-31 NOTE — Telephone Encounter (Signed)
Pharmacy Patient Advocate Encounter  Received notification from [facility] that the request for prior authorization for ELEPSIA XR 1000MG  has been denied due to .    Please be advised we currently do not have a Pharmacist to review denials, therefore you will need to process appeals accordingly as needed. Thanks for your support at this time.   You may call [phone] or fax [phone], to appeal.

## 2023-02-01 NOTE — Telephone Encounter (Signed)
Hi Monchell, I did not prescribe Elepsia XR, I sent in for generic Levetiracetam ER 1000mg  once every night. Is [employer] the only manufacturer of Levetiracetam ER?

## 2023-02-06 DIAGNOSIS — M79676 Pain in unspecified toe(s): Secondary | ICD-10-CM | POA: Diagnosis not present

## 2023-02-06 DIAGNOSIS — B351 Tinea unguium: Secondary | ICD-10-CM | POA: Diagnosis not present

## 2023-02-07 MED ORDER — LEVETIRACETAM ER 1000 MG PO TB24: ORAL_TABLET | ORAL | 11 refills | Status: DC

## 2023-02-07 NOTE — Telephone Encounter (Signed)
Any update on this? I sent in a prescription for Levetiracetam ER (Not [employer]), aren't there other manufacturers of this generic medications? Denial letter says "Manufacturer of drug does not participate in Medicare." I did not prescribe Elepsia specifically, was this a pharmacy issue?

## 2023-02-08 ENCOUNTER — Ambulatory Visit: Payer: Medicare PPO | Attending: Internal Medicine | Admitting: Internal Medicine

## 2023-02-08 ENCOUNTER — Encounter: Payer: Self-pay | Admitting: Internal Medicine

## 2023-02-08 ENCOUNTER — Other Ambulatory Visit (HOSPITAL_COMMUNITY): Payer: Self-pay

## 2023-02-08 VITALS — BP 103/69 | HR 94 | Ht 71.0 in | Wt 308.4 lb

## 2023-02-08 DIAGNOSIS — I951 Orthostatic hypotension: Secondary | ICD-10-CM | POA: Diagnosis not present

## 2023-02-08 DIAGNOSIS — I10 Essential (primary) hypertension: Secondary | ICD-10-CM | POA: Diagnosis not present

## 2023-02-08 MED ORDER — MOUNJARO 10 MG/0.5ML ~~LOC~~ SOAJ: 10.0000 mg | SUBCUTANEOUS | 0 refills | Status: DC

## 2023-02-08 NOTE — Progress Notes (Signed)
Cardiology Office Note  Date: 02/08/2023   ID: Seth [name], DOB Sep 20, 1956, MRN [id number]  PCP:  Benita Stabile, MD  Cardiologist:  Marjo Bicker, MD Electrophysiologist:  None   Reason for Office Visit: Management of orthostatic hypotension the request of Dr Karel Jarvis   History of Present Illness: Seth [name] is a 67 y.o. male known to have  DM 2, mild OSA not on CPAP was referred to cardiology clinic for management of orthostatic hypotension.  Orthostatic vitals during the clinic were positive for orthostatic hypotension in sitting, standing at 0-minute and standing at 3 minutes positions (with a drop in blood pressure from 100 mm SBP on lying to 70 mm Hg with sitting/standing). He also has associated symptoms of dizziness mainly with standing.  Denies angina or DOE. Denies leg swelling. He has chronic venous insufficiency for which he is not comfortable wearing thigh-high compression socks.  He was also taking metoprolol 200 mg and due to dizziness, the dose was gradually decreased.  Currently taking metoprolol 50 mg but he did not take the medication the last 1 week. He took it this morning after 1 week gap.   Past Medical History:  Diagnosis Date   Anxiety    Brain bleed [facility])    Intracerebral bleed - [employer] 2013   Bronchitis    Depression    History of kidney stones    Hypertension    Osteoarthritis of both knees    Pre-diabetes    Sleep apnea    dx. at one time, then they said no- mild, no cpap   Transient ischemic attack (TIA)     Past Surgical History:  Procedure Laterality Date   ACHILLES TENDON SURGERY Right 1994   BARIATRIC SURGERY     2005   cataracts Bilateral 2008   CHOLECYSTECTOMY     COLONOSCOPY  2015   Outside facility:  medium-sized lipoma in transverse colon.    EXTRACORPOREAL SHOCK WAVE LITHOTRIPSY Left 06/13/2018   Procedure: LEFT EXTRACORPOREAL SHOCK WAVE LITHOTRIPSY (ESWL) WITH MAC;  Surgeon: Bjorn Pippin, MD;  Location: [facility] ORS;   Service: Urology;  Laterality: Left;   repair of wrist fx Right    SPINAL CORD STIMULATOR INSERTION N/A 12/01/2020   Procedure: Lumbar spinal cord stimulator placement;  Surgeon: Renaldo Fiddler, MD;  Location: [facility] OR;  Service: Neurosurgery;  Laterality: N/A;   TONSILLECTOMY      Current Outpatient Medications  Medication Sig Dispense Refill   albuterol (VENTOLIN HFA) 108 (90 Base) MCG/ACT inhaler Inhale 1-2 puffs into the lungs every 6 (six) hours as needed for wheezing or shortness of breath.     ALPRAZolam (XANAX) 1 MG tablet Take 1 mg by mouth 3 (three) times daily as needed for anxiety.     aspirin 81 MG EC tablet Take 81 mg by mouth at bedtime.     atorvastatin (LIPITOR) 10 MG tablet Take 10 mg by mouth daily.     cetirizine (ZYRTEC) 10 MG tablet Take 10 mg by mouth at bedtime.     citalopram (CELEXA) 40 MG tablet Take 40 mg by mouth every evening.     diclofenac Sodium (VOLTAREN) 1 % GEL Apply 2 g topically daily as needed (pain).     levETIRAcetam ER 1000 MG TB24 Take 1 tablet every night 30 tablet 11   montelukast (SINGULAIR) 10 MG tablet Take 10 mg by mouth at bedtime.     oxyCODONE-acetaminophen (PERCOCET) 10-325 MG tablet Take 1-2 tablets by  mouth every 4 (four) hours as needed for severe pain.      silodosin (RAPAFLO) 8 MG CAPS capsule Take 1 capsule (8 mg total) by mouth daily with breakfast. 30 capsule 1   tamsulosin (FLOMAX) 0.4 MG CAPS capsule Take 0.4 mg by mouth daily.     triamcinolone cream (KENALOG) 0.5 % Apply 1 application topically daily as needed (irritation).     tirzepatide (MOUNJARO) 10 MG/0.5ML Pen Inject 10 mg into the skin once a week. Hold for 2 weeks-after nurse visit reassessment 2 mL 0   No current facility-administered medications for this visit.   Allergies:  Latex, Sulfa antibiotics, Amoxicillin-pot clavulanate, and Tape   Social History: The patient  reports that he has never smoked. He has never used smokeless tobacco. He reports current alcohol use.  He reports that he does not use drugs.   Family History: The patient's family history includes Emphysema in his mother; Heart attack in his brother; Lung cancer in his father.   ROS:  Please see the history of present illness. Otherwise, complete review of systems is positive for none  All other systems are reviewed and negative.   Physical Exam: VS:  BP 103/69   Pulse 94   Ht 5\' 6"  (1.676 m)   Wt (!) 308 lb 6.4 oz (139.9 kg)   SpO2 100%   BMI 49.78 kg/m , BMI Body mass index is 49.78 kg/m.  Wt Readings from Last 3 Encounters:  02/08/23 (!) 308 lb 6.4 oz (139.9 kg)  01/29/23 (!) 310 lb 12.8 oz (141 kg)  10/25/22 (!) 316 lb 6.4 oz (143.5 kg)    General: Patient appears comfortable at rest. HEENT: Conjunctiva and lids normal, oropharynx clear with moist mucosa. Neck: Supple, no elevated JVP or carotid bruits, no thyromegaly. Lungs: Clear to auscultation, nonlabored breathing at rest. Cardiac: Regular rate and rhythm, no S3 or significant systolic murmur, no pericardial rub. Abdomen: Soft, nontender, no hepatomegaly, bowel sounds present, no guarding or rebound. Extremities: No pitting edema, distal pulses 2+. Skin: Warm and dry. Musculoskeletal: No kyphosis. Neuropsychiatric: Alert and oriented x3, affect grossly appropriate.  Recent Labwork: No results found for requested labs within last 365 days.  No results found for: "CHOL", "TRIG", "HDL", "CHOLHDL", "VLDL", "LDLCALC", "LDLDIRECT"   Assessment and Plan: Patient is a 67 year old M known to have DM 2, mild OSA not on CPAP was referred to cardiology clinic for management of orthostatic hypotension.  # Orthostatic hypotension likely drug-induced -Orthostatic vitals during the clinic were positive for orthostatic hypotension in sitting, standing at 0-minute and standing at 3 minutes positions (with a drop in blood pressure from 100 mm SBP on lying to 70 mm Hg with sitting/standing). He also has associated symptoms of dizziness  mainly with standing x 3 months coinciding with initiation of Mounjaro. Last dose of Mounjaro was last week and he is due to take the shot today.  I instructed him to hold [facility] for the next 2 weeks and reassess orthostatic vitals in the nurse visit in 2 weeks. As Greggory Keen was noted to cause orthostatic hypotension.  If he continues to have orthostatic hypotension despite being off Mounjaro, he will benefit from salt intake, compression socks thigh-high and midodrine. -He was previously on metoprolol 200 mg probably for HTN and now currently taking 50 mg once daily due to low blood pressures after starting Mounjaro. He stopped taking it the last 1 week and the next dose was this morning.  Instructed him to stop metoprolol completely.  I have spent a total of 30 minutes with patient reviewing chart, EKGs, labs and examining patient as well as establishing an assessment and plan that was discussed with the patient.  > 50% of time was spent in direct patient care.    Medication Adjustments/Labs and Tests Ordered: Current medicines are reviewed at length with the patient today.  Concerns regarding medicines are outlined above.   Tests Ordered: Orders Placed This Encounter  Procedures   EKG 12-Lead    Medication Changes: Meds ordered this encounter  Medications   tirzepatide (MOUNJARO) 10 MG/0.5ML Pen    Sig: Inject 10 mg into the skin once a week. Hold for 2 weeks-after nurse visit reassessment    Dispense:  2 mL    Refill:  0    Disposition:  Follow up  2 weeks Nurse visit for orthostatics and 3 months with me  Signed Burdett Pinzon Verne Spurr, MD, 02/08/2023 4:57 PM    [facility] [address] [place], [place], [place] [address]

## 2023-02-08 NOTE — Patient Instructions (Addendum)
Medication Instructions:  Your physician has recommended you make the following change in your medication:  Stop metoprolol Hold mounjaro for 2 weeks Continue other medications the same  Labwork: none  Testing/Procedures: none  Follow-Up: Your physician recommends that you schedule a follow-up appointment in: 3 months Your physician recommends that you schedule a follow-up appointment in: 1 week with nurse for orthostatic blood pressures.  Any Other Special Instructions Will Be Listed Below (If Applicable).  If you need a refill on your cardiac medications before your next appointment, please call your pharmacy.

## 2023-02-08 NOTE — Telephone Encounter (Signed)
Hi there. There is not a generic Levetiracetam ER 1000mg  available. However there is a Levetiracetam ER 500mg  (Keppra XR) available. Test billing results return a copay of $10 for a 30 day supply. This is for 120 tablets of the ER 500mg , two tablets by mouth twice daily.

## 2023-02-09 MED ORDER — LEVETIRACETAM ER 500 MG PO TB24: ORAL_TABLET | ORAL | 3 refills | Status: DC

## 2023-02-09 NOTE — Telephone Encounter (Signed)
Pls let him know that the 1000mg  extended-release is not available, but we were told the 500mg  ER price is reasonable. I sent in Rx for Levetiracetam ER 500mg : take 2 tablets every night. Thanks

## 2023-02-09 NOTE — Addendum Note (Signed)
Addended by: Van Clines on: 02/09/2023 09:09 AM   Modules accepted: Orders

## 2023-02-09 NOTE — Telephone Encounter (Signed)
Pt called no answer left a voice mail to call the office back  °

## 2023-02-14 NOTE — Telephone Encounter (Signed)
Pt called an informed that the 1000mg  extended-release is not available, but we were told the 500mg  ER price is reasonable. I sent in Rx for Levetiracetam ER 500mg : take 2 tablets every night

## 2023-02-23 ENCOUNTER — Encounter: Payer: Self-pay | Admitting: Internal Medicine

## 2023-05-08 DIAGNOSIS — B351 Tinea unguium: Secondary | ICD-10-CM | POA: Diagnosis not present

## 2023-05-08 DIAGNOSIS — M79676 Pain in unspecified toe(s): Secondary | ICD-10-CM | POA: Diagnosis not present

## 2023-05-10 DIAGNOSIS — Z125 Encounter for screening for malignant neoplasm of prostate: Secondary | ICD-10-CM | POA: Diagnosis not present

## 2023-05-10 DIAGNOSIS — E118 Type 2 diabetes mellitus with unspecified complications: Secondary | ICD-10-CM | POA: Diagnosis not present

## 2023-05-10 DIAGNOSIS — E782 Mixed hyperlipidemia: Secondary | ICD-10-CM | POA: Diagnosis not present

## 2023-05-17 DIAGNOSIS — E782 Mixed hyperlipidemia: Secondary | ICD-10-CM | POA: Diagnosis not present

## 2023-05-17 DIAGNOSIS — I951 Orthostatic hypotension: Secondary | ICD-10-CM | POA: Diagnosis not present

## 2023-05-17 DIAGNOSIS — F411 Generalized anxiety disorder: Secondary | ICD-10-CM | POA: Diagnosis not present

## 2023-05-17 DIAGNOSIS — E1165 Type 2 diabetes mellitus with hyperglycemia: Secondary | ICD-10-CM | POA: Diagnosis not present

## 2023-05-17 DIAGNOSIS — F32A Depression, unspecified: Secondary | ICD-10-CM | POA: Diagnosis not present

## 2023-05-17 DIAGNOSIS — G40909 Epilepsy, unspecified, not intractable, without status epilepticus: Secondary | ICD-10-CM | POA: Diagnosis not present

## 2023-05-17 DIAGNOSIS — I10 Essential (primary) hypertension: Secondary | ICD-10-CM | POA: Diagnosis not present

## 2023-05-17 DIAGNOSIS — I959 Hypotension, unspecified: Secondary | ICD-10-CM | POA: Diagnosis not present

## 2023-05-28 ENCOUNTER — Ambulatory Visit: Payer: Medicare PPO | Attending: Internal Medicine | Admitting: Internal Medicine

## 2023-05-28 ENCOUNTER — Encounter: Payer: Self-pay | Admitting: Internal Medicine

## 2023-05-28 ENCOUNTER — Ambulatory Visit: Payer: Medicare PPO | Admitting: Internal Medicine

## 2023-05-28 VITALS — BP 120/80 | HR 70 | Ht 72.0 in | Wt 308.6 lb

## 2023-05-28 DIAGNOSIS — G4733 Obstructive sleep apnea (adult) (pediatric): Secondary | ICD-10-CM

## 2023-05-28 NOTE — Patient Instructions (Signed)
 Medication Instructions:  Your physician recommends that you continue on your current medications as directed. Please refer to the Current Medication list given to you today.  Labwork: None  Testing/Procedures: None  Follow-Up: Your physician recommends that you schedule a follow-up appointment in: 1 Year with Dr.Mallipeddi   Any Other Special Instructions Will Be Listed Below (If Applicable).  If you need a refill on your cardiac medications before your next appointment, please call your pharmacy.

## 2023-05-28 NOTE — Progress Notes (Signed)
Cardiology Office Note  Date: 05/28/2023   ID: Boe, Goffredo [birth year], MRN [id number]  PCP:  Benita Stabile, MD  Cardiologist:  Marjo Bicker, MD Electrophysiologist:  None    History of Present Illness: Seth [name] is a 67 y.o. male known to have  DM 2, mild OSA not on CPAP is here for follow-up visit.  Orthostatic vitals showed initial clinic visit were positive for orthostatic hypotension in sitting, standing at 0-minute and standing at 3 minutes positions (with a drop in blood pressure from 100 mm SBP on lying to 70 mm Hg with sitting/standing). He also has associated symptoms of dizziness mainly with standing.  Metoprolol was self discontinued and Greggory Keen was also held in the last clinic visit after which dizziness resolved.  He does not report any symptoms now.  No syncope, presyncope, angina, DOE, orthopnea, PND and leg swelling.    Past Medical History:  Diagnosis Date   Anxiety    Brain bleed [place])    Intracerebral bleed - [facility] [employer] 2013   Bronchitis    Depression    History of kidney stones    Hypertension    Osteoarthritis of both knees    Pre-diabetes    Sleep apnea    dx. at one time, then they said no- mild, no cpap   Transient ischemic attack (TIA)     Past Surgical History:  Procedure Laterality Date   ACHILLES TENDON SURGERY Right 1994   BARIATRIC SURGERY     2005   cataracts Bilateral 2008   CHOLECYSTECTOMY     COLONOSCOPY  2015   Outside facility:  medium-sized lipoma in transverse colon.    EXTRACORPOREAL SHOCK WAVE LITHOTRIPSY Left 06/13/2018   Procedure: LEFT EXTRACORPOREAL SHOCK WAVE LITHOTRIPSY (ESWL) WITH MAC;  Surgeon: Bjorn Pippin, MD;  Location: [facility];  Service: Urology;  Laterality: Left;   repair of wrist fx Right    SPINAL CORD STIMULATOR INSERTION N/A 12/01/2020   Procedure: Lumbar spinal cord stimulator placement;  Surgeon: Renaldo Fiddler, MD;  Location: [facility] OR;  Service: Neurosurgery;  Laterality: N/A;    TONSILLECTOMY      Current Outpatient Medications  Medication Sig Dispense Refill   albuterol (VENTOLIN HFA) 108 (90 Base) MCG/ACT inhaler Inhale 1-2 puffs into the lungs every 6 (six) hours as needed for wheezing or shortness of breath.     ALPRAZolam (XANAX) 1 MG tablet Take 1 mg by mouth 3 (three) times daily as needed for anxiety.     aspirin 81 MG EC tablet Take 81 mg by mouth at bedtime.     atorvastatin (LIPITOR) 10 MG tablet Take 10 mg by mouth daily.     cetirizine (ZYRTEC) 10 MG tablet Take 10 mg by mouth at bedtime.     citalopram (CELEXA) 40 MG tablet Take 40 mg by mouth every evening.     diclofenac Sodium (VOLTAREN) 1 % GEL Apply 2 g topically daily as needed (pain).     levETIRAcetam (KEPPRA XR) 500 MG 24 hr tablet Take 2 tablets every night 180 tablet 3   montelukast (SINGULAIR) 10 MG tablet Take 10 mg by mouth at bedtime.     oxyCODONE-acetaminophen (PERCOCET) 10-325 MG tablet Take 1-2 tablets by mouth every 4 (four) hours as needed for severe pain.      silodosin (RAPAFLO) 8 MG CAPS capsule Take 1 capsule (8 mg total) by mouth daily with breakfast. 30 capsule 1   tamsulosin (FLOMAX) 0.4 MG CAPS  capsule Take 0.4 mg by mouth daily.     tirzepatide (MOUNJARO) 10 MG/0.5ML Pen Inject 10 mg into the skin once a week. Hold for 2 weeks-after nurse visit reassessment 2 mL 0   triamcinolone cream (KENALOG) 0.5 % Apply 1 application topically daily as needed (irritation).     No current facility-administered medications for this visit.   Allergies:  Latex, Sulfa antibiotics, Amoxicillin-pot clavulanate, and Tape   Social History: The patient  reports that he has never smoked. He has never used smokeless tobacco. He reports current alcohol use. He reports that he does not use drugs.   Family History: The patient's family history includes Emphysema in his mother; Heart attack in his brother; Lung cancer in his father.   ROS:  Please see the history of present illness. Otherwise,  complete review of systems is positive for none  All other systems are reviewed and negative.   Physical Exam: VS:  BP 120/80   Pulse 70   Ht 6' (1.829 m)   Wt (!) 308 lb 9.6 oz (140 kg)   SpO2 95%   BMI 41.85 kg/m , BMI Body mass index is 41.85 kg/m.  Wt Readings from Last 3 Encounters:  05/28/23 (!) 308 lb 9.6 oz (140 kg)  02/08/23 (!) 308 lb 6.4 oz (139.9 kg)  01/29/23 (!) 310 lb 12.8 oz (141 kg)    General: Patient appears comfortable at rest. HEENT: Conjunctiva and lids normal, oropharynx clear with moist mucosa. Neck: Supple, no elevated JVP or carotid bruits, no thyromegaly. Lungs: Clear to auscultation, nonlabored breathing at rest. Cardiac: Regular rate and rhythm, no S3 or significant systolic murmur, no pericardial rub. Abdomen: Soft, nontender, no hepatomegaly, bowel sounds present, no guarding or rebound. Extremities: No pitting edema, distal pulses 2+. Skin: Warm and dry. Musculoskeletal: No kyphosis. Neuropsychiatric: Alert and oriented x3, affect grossly appropriate.  Recent Labwork: No results found for requested labs within last 365 days.  No results found for: "CHOL", "TRIG", "HDL", "CHOLHDL", "VLDL", "LDLCALC", "LDLDIRECT"   Assessment and Plan: Patient is a 67 year old M known to have DM 2, mild OSA not on CPAP is here for follow-up visit.  # Orthostatic hypotension likely drug-induced, resolved -Greggory Keen was held in the last clinic visit after which his dizziness resolved.  He was seen by his PCP recently and started him back on Mounjaro. Patient is awaiting to receive Mounjaro in mail.  Recommended him to follow-up with PCP if he starts to develop any new dizziness after starting Mounjaro.  # HTN, controlled -He used to take metoprolol in the past but after starting Mounjaro, his blood pressure dropped and hence metoprolol was self discontinued.  # OSA not on CPAP -Does not want to use CPAP.    Follow-up in 1 year   Signed Jayonna Meyering Verne Spurr, MD, 05/28/2023 4:04 PM    [facility] at [facility] [address] [place], [place], [place] [address]

## 2023-06-08 ENCOUNTER — Other Ambulatory Visit (HOSPITAL_BASED_OUTPATIENT_CLINIC_OR_DEPARTMENT_OTHER): Payer: Self-pay

## 2023-06-09 ENCOUNTER — Other Ambulatory Visit (HOSPITAL_BASED_OUTPATIENT_CLINIC_OR_DEPARTMENT_OTHER): Payer: Self-pay

## 2023-06-09 MED ORDER — MOUNJARO 7.5 MG/0.5ML ~~LOC~~ SOAJ
7.5000 mg | SUBCUTANEOUS | 3 refills | Status: DC
  Filled 2023-06-09: qty 2, 28d supply, fill #0
  Filled 2023-08-29: qty 2, 28d supply, fill #1

## 2023-06-09 MED ORDER — MOUNJARO 5 MG/0.5ML ~~LOC~~ SOAJ: 5.0000 mg | SUBCUTANEOUS | 0 refills | Status: DC

## 2023-06-11 ENCOUNTER — Other Ambulatory Visit (HOSPITAL_COMMUNITY): Payer: Self-pay

## 2023-06-11 DIAGNOSIS — J019 Acute sinusitis, unspecified: Secondary | ICD-10-CM | POA: Diagnosis not present

## 2023-06-11 DIAGNOSIS — R059 Cough, unspecified: Secondary | ICD-10-CM | POA: Diagnosis not present

## 2023-06-11 DIAGNOSIS — L989 Disorder of the skin and subcutaneous tissue, unspecified: Secondary | ICD-10-CM | POA: Diagnosis not present

## 2023-06-11 DIAGNOSIS — Z79899 Other long term (current) drug therapy: Secondary | ICD-10-CM | POA: Diagnosis not present

## 2023-06-11 MED ORDER — MOUNJARO 5 MG/0.5ML ~~LOC~~ SOAJ
5.0000 mg | SUBCUTANEOUS | 0 refills | Status: DC
  Filled 2023-06-11: qty 2, 28d supply, fill #0

## 2023-06-11 MED ORDER — MOUNJARO 7.5 MG/0.5ML ~~LOC~~ SOAJ
7.5000 mg | SUBCUTANEOUS | 2 refills | Status: DC
  Filled 2023-08-29: qty 2, 28d supply, fill #0

## 2023-07-31 ENCOUNTER — Ambulatory Visit: Payer: Medicare PPO | Admitting: Neurology

## 2023-08-07 DIAGNOSIS — M79676 Pain in unspecified toe(s): Secondary | ICD-10-CM | POA: Diagnosis not present

## 2023-08-07 DIAGNOSIS — B351 Tinea unguium: Secondary | ICD-10-CM | POA: Diagnosis not present

## 2023-08-15 DIAGNOSIS — R262 Difficulty in walking, not elsewhere classified: Secondary | ICD-10-CM | POA: Diagnosis not present

## 2023-08-15 DIAGNOSIS — M170 Bilateral primary osteoarthritis of knee: Secondary | ICD-10-CM | POA: Diagnosis not present

## 2023-08-15 DIAGNOSIS — M25561 Pain in right knee: Secondary | ICD-10-CM | POA: Diagnosis not present

## 2023-08-15 DIAGNOSIS — M25562 Pain in left knee: Secondary | ICD-10-CM | POA: Diagnosis not present

## 2023-08-22 DIAGNOSIS — M25561 Pain in right knee: Secondary | ICD-10-CM | POA: Diagnosis not present

## 2023-08-22 DIAGNOSIS — M25562 Pain in left knee: Secondary | ICD-10-CM | POA: Diagnosis not present

## 2023-08-22 DIAGNOSIS — M170 Bilateral primary osteoarthritis of knee: Secondary | ICD-10-CM | POA: Diagnosis not present

## 2023-08-22 DIAGNOSIS — R262 Difficulty in walking, not elsewhere classified: Secondary | ICD-10-CM | POA: Diagnosis not present

## 2023-08-29 ENCOUNTER — Other Ambulatory Visit (HOSPITAL_BASED_OUTPATIENT_CLINIC_OR_DEPARTMENT_OTHER): Payer: Self-pay

## 2023-08-29 ENCOUNTER — Other Ambulatory Visit (HOSPITAL_COMMUNITY): Payer: Self-pay

## 2023-08-29 DIAGNOSIS — M25561 Pain in right knee: Secondary | ICD-10-CM | POA: Diagnosis not present

## 2023-08-29 DIAGNOSIS — M25562 Pain in left knee: Secondary | ICD-10-CM | POA: Diagnosis not present

## 2023-08-29 DIAGNOSIS — M170 Bilateral primary osteoarthritis of knee: Secondary | ICD-10-CM | POA: Diagnosis not present

## 2023-08-29 DIAGNOSIS — R262 Difficulty in walking, not elsewhere classified: Secondary | ICD-10-CM | POA: Diagnosis not present

## 2023-09-04 DIAGNOSIS — J0110 Acute frontal sinusitis, unspecified: Secondary | ICD-10-CM | POA: Diagnosis not present

## 2023-09-04 DIAGNOSIS — F411 Generalized anxiety disorder: Secondary | ICD-10-CM | POA: Diagnosis not present

## 2023-09-04 DIAGNOSIS — Z79899 Other long term (current) drug therapy: Secondary | ICD-10-CM | POA: Diagnosis not present

## 2023-09-04 DIAGNOSIS — Z6841 Body Mass Index (BMI) 40.0 and over, adult: Secondary | ICD-10-CM | POA: Diagnosis not present

## 2023-09-04 DIAGNOSIS — G894 Chronic pain syndrome: Secondary | ICD-10-CM | POA: Diagnosis not present

## 2023-09-04 DIAGNOSIS — R059 Cough, unspecified: Secondary | ICD-10-CM | POA: Diagnosis not present

## 2023-09-05 DIAGNOSIS — R262 Difficulty in walking, not elsewhere classified: Secondary | ICD-10-CM | POA: Diagnosis not present

## 2023-09-05 DIAGNOSIS — M25561 Pain in right knee: Secondary | ICD-10-CM | POA: Diagnosis not present

## 2023-09-05 DIAGNOSIS — M170 Bilateral primary osteoarthritis of knee: Secondary | ICD-10-CM | POA: Diagnosis not present

## 2023-09-05 DIAGNOSIS — M25562 Pain in left knee: Secondary | ICD-10-CM | POA: Diagnosis not present

## 2023-09-12 DIAGNOSIS — E118 Type 2 diabetes mellitus with unspecified complications: Secondary | ICD-10-CM | POA: Diagnosis not present

## 2023-09-12 DIAGNOSIS — E782 Mixed hyperlipidemia: Secondary | ICD-10-CM | POA: Diagnosis not present

## 2023-09-17 ENCOUNTER — Other Ambulatory Visit (HOSPITAL_BASED_OUTPATIENT_CLINIC_OR_DEPARTMENT_OTHER): Payer: Self-pay

## 2023-09-17 ENCOUNTER — Other Ambulatory Visit: Payer: Self-pay

## 2023-09-17 DIAGNOSIS — I959 Hypotension, unspecified: Secondary | ICD-10-CM | POA: Diagnosis not present

## 2023-09-17 DIAGNOSIS — E118 Type 2 diabetes mellitus with unspecified complications: Secondary | ICD-10-CM | POA: Diagnosis not present

## 2023-09-17 DIAGNOSIS — E1159 Type 2 diabetes mellitus with other circulatory complications: Secondary | ICD-10-CM | POA: Diagnosis not present

## 2023-09-17 DIAGNOSIS — I10 Essential (primary) hypertension: Secondary | ICD-10-CM | POA: Diagnosis not present

## 2023-09-17 DIAGNOSIS — E782 Mixed hyperlipidemia: Secondary | ICD-10-CM | POA: Diagnosis not present

## 2023-09-17 DIAGNOSIS — G40909 Epilepsy, unspecified, not intractable, without status epilepticus: Secondary | ICD-10-CM | POA: Diagnosis not present

## 2023-09-17 DIAGNOSIS — E1169 Type 2 diabetes mellitus with other specified complication: Secondary | ICD-10-CM | POA: Diagnosis not present

## 2023-09-17 DIAGNOSIS — F411 Generalized anxiety disorder: Secondary | ICD-10-CM | POA: Diagnosis not present

## 2023-09-17 DIAGNOSIS — F32A Depression, unspecified: Secondary | ICD-10-CM | POA: Diagnosis not present

## 2023-09-17 MED ORDER — MOUNJARO 10 MG/0.5ML ~~LOC~~ SOAJ
10.0000 mg | SUBCUTANEOUS | 3 refills | Status: DC
  Filled 2023-09-17: qty 2, 28d supply, fill #0
  Filled 2023-10-17: qty 2, 28d supply, fill #1
  Filled 2023-11-21: qty 2, 28d supply, fill #2

## 2023-09-18 ENCOUNTER — Other Ambulatory Visit (HOSPITAL_BASED_OUTPATIENT_CLINIC_OR_DEPARTMENT_OTHER): Payer: Self-pay

## 2023-09-18 ENCOUNTER — Encounter: Payer: Self-pay | Admitting: Internal Medicine

## 2023-09-19 DIAGNOSIS — M25562 Pain in left knee: Secondary | ICD-10-CM | POA: Diagnosis not present

## 2023-09-19 DIAGNOSIS — M25561 Pain in right knee: Secondary | ICD-10-CM | POA: Diagnosis not present

## 2023-09-19 DIAGNOSIS — R262 Difficulty in walking, not elsewhere classified: Secondary | ICD-10-CM | POA: Diagnosis not present

## 2023-09-19 DIAGNOSIS — M170 Bilateral primary osteoarthritis of knee: Secondary | ICD-10-CM | POA: Diagnosis not present

## 2023-09-27 ENCOUNTER — Other Ambulatory Visit (HOSPITAL_BASED_OUTPATIENT_CLINIC_OR_DEPARTMENT_OTHER): Payer: Self-pay

## 2023-09-27 MED ORDER — PREDNISONE 10 MG PO TABS
ORAL_TABLET | ORAL | 0 refills | Status: DC
  Filled 2023-09-27: qty 21, 6d supply, fill #0

## 2023-09-27 MED ORDER — PROMETHAZINE-DM 6.25-15 MG/5ML PO SYRP
5.0000 mL | ORAL_SOLUTION | ORAL | 0 refills | Status: DC | PRN
  Filled 2023-09-27: qty 40, 2d supply, fill #0
  Filled 2023-09-27: qty 160, 5d supply, fill #0

## 2023-10-05 DIAGNOSIS — R42 Dizziness and giddiness: Secondary | ICD-10-CM | POA: Diagnosis not present

## 2023-10-05 DIAGNOSIS — Z6837 Body mass index (BMI) 37.0-37.9, adult: Secondary | ICD-10-CM | POA: Diagnosis not present

## 2023-10-05 DIAGNOSIS — I10 Essential (primary) hypertension: Secondary | ICD-10-CM | POA: Diagnosis not present

## 2023-10-05 DIAGNOSIS — J0110 Acute frontal sinusitis, unspecified: Secondary | ICD-10-CM | POA: Diagnosis not present

## 2023-10-17 ENCOUNTER — Other Ambulatory Visit (HOSPITAL_BASED_OUTPATIENT_CLINIC_OR_DEPARTMENT_OTHER): Payer: Self-pay

## 2023-10-18 ENCOUNTER — Other Ambulatory Visit (HOSPITAL_BASED_OUTPATIENT_CLINIC_OR_DEPARTMENT_OTHER): Payer: Self-pay

## 2023-10-30 DIAGNOSIS — B351 Tinea unguium: Secondary | ICD-10-CM | POA: Diagnosis not present

## 2023-10-30 DIAGNOSIS — M79676 Pain in unspecified toe(s): Secondary | ICD-10-CM | POA: Diagnosis not present

## 2023-11-21 ENCOUNTER — Other Ambulatory Visit (HOSPITAL_BASED_OUTPATIENT_CLINIC_OR_DEPARTMENT_OTHER): Payer: Self-pay

## 2023-12-11 ENCOUNTER — Other Ambulatory Visit (HOSPITAL_BASED_OUTPATIENT_CLINIC_OR_DEPARTMENT_OTHER): Payer: Self-pay

## 2023-12-20 DIAGNOSIS — N401 Enlarged prostate with lower urinary tract symptoms: Secondary | ICD-10-CM | POA: Diagnosis not present

## 2023-12-20 DIAGNOSIS — R350 Frequency of micturition: Secondary | ICD-10-CM | POA: Diagnosis not present

## 2023-12-20 DIAGNOSIS — R8289 Other abnormal findings on cytological and histological examination of urine: Secondary | ICD-10-CM | POA: Diagnosis not present

## 2024-01-01 ENCOUNTER — Other Ambulatory Visit (HOSPITAL_BASED_OUTPATIENT_CLINIC_OR_DEPARTMENT_OTHER): Payer: Self-pay

## 2024-01-01 DIAGNOSIS — F411 Generalized anxiety disorder: Secondary | ICD-10-CM | POA: Diagnosis not present

## 2024-01-01 DIAGNOSIS — Z6839 Body mass index (BMI) 39.0-39.9, adult: Secondary | ICD-10-CM | POA: Diagnosis not present

## 2024-01-01 DIAGNOSIS — F32A Depression, unspecified: Secondary | ICD-10-CM | POA: Diagnosis not present

## 2024-01-01 MED ORDER — MOUNJARO 12.5 MG/0.5ML ~~LOC~~ SOAJ
12.5000 mg | SUBCUTANEOUS | 2 refills | Status: DC
Start: 2024-01-01 — End: 2024-04-21
  Filled 2024-01-01: qty 2, 28d supply, fill #0

## 2024-01-02 ENCOUNTER — Other Ambulatory Visit (HOSPITAL_BASED_OUTPATIENT_CLINIC_OR_DEPARTMENT_OTHER): Payer: Self-pay

## 2024-01-09 DIAGNOSIS — M9903 Segmental and somatic dysfunction of lumbar region: Secondary | ICD-10-CM | POA: Diagnosis not present

## 2024-01-09 DIAGNOSIS — M62830 Muscle spasm of back: Secondary | ICD-10-CM | POA: Diagnosis not present

## 2024-01-09 DIAGNOSIS — M9905 Segmental and somatic dysfunction of pelvic region: Secondary | ICD-10-CM | POA: Diagnosis not present

## 2024-01-09 DIAGNOSIS — E782 Mixed hyperlipidemia: Secondary | ICD-10-CM | POA: Diagnosis not present

## 2024-01-09 DIAGNOSIS — M9902 Segmental and somatic dysfunction of thoracic region: Secondary | ICD-10-CM | POA: Diagnosis not present

## 2024-01-09 DIAGNOSIS — E118 Type 2 diabetes mellitus with unspecified complications: Secondary | ICD-10-CM | POA: Diagnosis not present

## 2024-01-10 DIAGNOSIS — R350 Frequency of micturition: Secondary | ICD-10-CM | POA: Diagnosis not present

## 2024-01-14 ENCOUNTER — Telehealth: Payer: Self-pay

## 2024-01-14 NOTE — Progress Notes (Signed)
   01/14/2024  Patient ID: Seth [name], male   DOB: [birth year], 68 y.o.   MRN: [id number]   Patient appeared on insurance report for not passing the quality metrics in 2024:  Medication Adherence for Cholesterol (MAC) Medication Adherence for Diabetes (MAD)   Outreach to the patient was not needed today.  Meds Tracking:  -Atorvastatin 10 mg - Last filled 90DS on 10/18/23, LDL 31 on 01/09/24. Does not qualify for metric yet this year, next fill 01/16/24.  -Mounjaro 12.5 mg - Last filled 28DS on 01/02/24, A1C 5.8 on 01/09/24. Qualifies for metric this year, next fill 01/30/24.  Plan:  Review fill history on 01/18/24, outreach if no fill for atorvastatin. Conditions well controlled but will continue to follow adherence for above meds throughout the year.   Flint Hummer, PharmD

## 2024-01-15 DIAGNOSIS — M79674 Pain in right toe(s): Secondary | ICD-10-CM | POA: Diagnosis not present

## 2024-01-15 DIAGNOSIS — B351 Tinea unguium: Secondary | ICD-10-CM | POA: Diagnosis not present

## 2024-01-15 DIAGNOSIS — M79675 Pain in left toe(s): Secondary | ICD-10-CM | POA: Diagnosis not present

## 2024-01-16 ENCOUNTER — Other Ambulatory Visit (HOSPITAL_BASED_OUTPATIENT_CLINIC_OR_DEPARTMENT_OTHER): Payer: Self-pay

## 2024-01-16 DIAGNOSIS — Z0001 Encounter for general adult medical examination with abnormal findings: Secondary | ICD-10-CM | POA: Diagnosis not present

## 2024-01-16 DIAGNOSIS — E1169 Type 2 diabetes mellitus with other specified complication: Secondary | ICD-10-CM | POA: Diagnosis not present

## 2024-01-16 DIAGNOSIS — G40909 Epilepsy, unspecified, not intractable, without status epilepticus: Secondary | ICD-10-CM | POA: Diagnosis not present

## 2024-01-16 DIAGNOSIS — I959 Hypotension, unspecified: Secondary | ICD-10-CM | POA: Diagnosis not present

## 2024-01-16 DIAGNOSIS — E1159 Type 2 diabetes mellitus with other circulatory complications: Secondary | ICD-10-CM | POA: Diagnosis not present

## 2024-01-16 DIAGNOSIS — I10 Essential (primary) hypertension: Secondary | ICD-10-CM | POA: Diagnosis not present

## 2024-01-16 DIAGNOSIS — F411 Generalized anxiety disorder: Secondary | ICD-10-CM | POA: Diagnosis not present

## 2024-01-16 DIAGNOSIS — N3281 Overactive bladder: Secondary | ICD-10-CM | POA: Diagnosis not present

## 2024-01-16 DIAGNOSIS — F32A Depression, unspecified: Secondary | ICD-10-CM | POA: Diagnosis not present

## 2024-01-16 MED ORDER — MOUNJARO 12.5 MG/0.5ML ~~LOC~~ SOAJ: 12.5000 mg | SUBCUTANEOUS | 2 refills | Status: DC

## 2024-01-18 ENCOUNTER — Telehealth: Payer: Self-pay

## 2024-01-18 NOTE — Progress Notes (Signed)
   01/18/2024  Patient ID: Seth [name], male   DOB: 1955-11-15, 68 y.o.   MRN: [id number]   Patient appeared on insurance report for not passing the quality metrics in 2024:  Medication Adherence for Cholesterol (MAC) Medication Adherence for Diabetes (MAD)   Outreach to the patient was not needed today.  Meds Tracking:  -Atorvastatin 10 mg - Last filled 90DS on 01/16/24, LDL 31 on 01/09/24. Qualifies for metric yet this year, next fill 04/15/24.  -Mounjaro  12.5 mg - Last filled 28DS on 01/02/24, A1C 5.8 on 01/09/24. Qualifies for metric this year, next fill 01/30/24.  Plan:   Conditions well controlled but will continue to follow adherence for above meds throughout the year. Next fill history review 02/01/24.   Flint Hummer, PharmD

## 2024-01-23 DIAGNOSIS — M25562 Pain in left knee: Secondary | ICD-10-CM | POA: Diagnosis not present

## 2024-01-23 DIAGNOSIS — M25561 Pain in right knee: Secondary | ICD-10-CM | POA: Diagnosis not present

## 2024-01-24 ENCOUNTER — Telehealth: Payer: Self-pay | Admitting: Internal Medicine

## 2024-01-24 NOTE — Telephone Encounter (Signed)
   Name: Seth [name]  DOB: 09/07/1956  MRN: [id number]  Primary Cardiologist: Lasalle Pointer, MD   Preoperative team, please contact this patient and set up a phone call appointment for further preoperative risk assessment. Please obtain consent and complete medication review. Thank you for your help.  I confirm that guidance regarding antiplatelet and oral anticoagulation therapy has been completed and, if necessary, noted below.  None requested   I also confirmed the patient resides in the state of [place] . As per [facility] Medical Board telemedicine laws, the patient must reside in the state in which the provider is licensed.   Carie Charity, NP 01/24/2024, 4:39 PM [facility]

## 2024-01-24 NOTE — Telephone Encounter (Signed)
   Pre-operative Risk Assessment    Patient Name: Seth [name]  DOB: Jun 21, 1956 MRN: [id number]{     Request for Surgical Clearance    Procedure:   Left total  knee arthroplasty   Date of Surgery:  Clearance TBD                                 Surgeon:  Priscille Brought MD Surgeon's Group or Practice Name:  [facility]  Phone number:  [phone]  ext [phone] Fax number:  [phone]   Type of Clearance Requested:   - Medical    Type of Anesthesia:  Not Indicated   Additional requests/questions:    Milana Ali   01/24/2024, 4:22 PM

## 2024-01-25 ENCOUNTER — Telehealth: Payer: Self-pay

## 2024-01-25 NOTE — Telephone Encounter (Signed)
  Patient Consent for Virtual Visit         Seth [name] has provided verbal consent on 01/25/2024 for a virtual visit (video or telephone).   CONSENT FOR VIRTUAL VISIT FOR:  Seth [name]  By participating in this virtual visit I agree to the following:  I hereby voluntarily request, consent and authorize [facility] and its employed or contracted physicians, physician assistants, nurse practitioners or other licensed health care professionals (the Practitioner), to provide me with telemedicine health care services (the "Services") as deemed necessary by the treating Practitioner. I acknowledge and consent to receive the Services by the Practitioner via telemedicine. I understand that the telemedicine visit will involve communicating with the Practitioner through live audiovisual communication technology and the disclosure of certain medical information by electronic transmission. I acknowledge that I have been given the opportunity to request an in-person assessment or other available alternative prior to the telemedicine visit and am voluntarily participating in the telemedicine visit.  I understand that I have the right to withhold or withdraw my consent to the use of telemedicine in the course of my care at any time, without affecting my right to future care or treatment, and that the Practitioner or I may terminate the telemedicine visit at any time. I understand that I have the right to inspect all information obtained and/or recorded in the course of the telemedicine visit and may receive copies of available information for a reasonable fee.  I understand that some of the potential risks of receiving the Services via telemedicine include:  Delay or interruption in medical evaluation due to technological equipment failure or disruption; Information transmitted may not be sufficient (e.g. poor resolution of images) to allow for appropriate medical decision making by the  Practitioner; and/or  In rare instances, security protocols could fail, causing a breach of personal health information.  Furthermore, I acknowledge that it is my responsibility to provide information about my medical history, conditions and care that is complete and accurate to the best of my ability. I acknowledge that Practitioner's advice, recommendations, and/or decision may be based on factors not within their control, such as incomplete or inaccurate data provided by me or distortions of diagnostic images or specimens that may result from electronic transmissions. I understand that the practice of medicine is not an exact science and that Practitioner makes no warranties or guarantees regarding treatment outcomes. I acknowledge that a copy of this consent can be made available to me via my patient portal [facility] MyChart), or I can request a printed copy by calling the office of [facility].    I understand that my insurance will be billed for this visit.   I have read or had this consent read to me. I understand the contents of this consent, which adequately explains the benefits and risks of the Services being provided via telemedicine.  I have been provided ample opportunity to ask questions regarding this consent and the Services and have had my questions answered to my satisfaction. I give my informed consent for the services to be provided through the use of telemedicine in my medical care

## 2024-01-25 NOTE — Telephone Encounter (Signed)
 Patient agreeable with telehealth appointment and voiced understanding.

## 2024-01-31 ENCOUNTER — Ambulatory Visit: Attending: Cardiology

## 2024-01-31 DIAGNOSIS — N3941 Urge incontinence: Secondary | ICD-10-CM | POA: Diagnosis not present

## 2024-01-31 DIAGNOSIS — Z01810 Encounter for preprocedural cardiovascular examination: Secondary | ICD-10-CM | POA: Diagnosis not present

## 2024-01-31 NOTE — Progress Notes (Signed)
 Virtual Visit via Telephone Note   Because of SAMVIT PEOT co-morbid illnesses, he is at least at moderate risk for complications without adequate follow up.  This format is felt to be most appropriate for this patient at this time.  Due to technical limitations with video connection (technology), today's appointment will be conducted as an audio only telehealth visit, and ZYLIN KUHNKE verbally agreed to proceed in this manner.   All issues noted in this document were discussed and addressed.  No physical exam could be performed with this format.  Evaluation Performed:  Preoperative cardiovascular risk assessment _____________   Date:  01/31/2024   Patient ID:  Seth [name], DOB December 17, 1955, MRN [id number] Patient Location:  Home Provider location:   Office  Primary Care Provider:  Omie Bickers, MD Primary Cardiologist:  Vishnu P Mallipeddi, MD  Chief Complaint / Patient Profile  68 y.o. y/o male with a h/o orthostatic hypotension, type 2 diabetes mellitus, mild OSA not on CPAP, who is pending left total knee arthroplasty and presents today for telephonic preoperative cardiovascular risk assessment. History of Present Illness  KESHON WORRELL is a 68 y.o. male who presents via audio/video conferencing for a telehealth visit today.  Pt was last seen in cardiology clinic on 05/28/23 by Dr. Mallipeddi.  At that time DASHTON CALDARELLI was doing well.  The patient is now pending procedure as outlined above. Since his last visit, he has remained stable from a cardiac standpoint. He is able to achieve greater than 4 METs of activity. Today he denies chest pain, shortness of breath, lower extremity edema, fatigue, palpitations, melena, hematuria, hemoptysis, diaphoresis, weakness, presyncope, syncope, orthopnea, and PND.  Past Medical History    Past Medical History:  Diagnosis Date   Anxiety    Brain bleed [facility])    Intracerebral bleed - [facility] 2013   Bronchitis    Depression     History of kidney stones    Hypertension    Osteoarthritis of both knees    Pre-diabetes    Sleep apnea    dx. at one time, then they said no- mild, no cpap   Transient ischemic attack (TIA)    Past Surgical History:  Procedure Laterality Date   ACHILLES TENDON SURGERY Right 1994   BARIATRIC SURGERY     2005   cataracts Bilateral 2008   CHOLECYSTECTOMY     COLONOSCOPY  2015   Outside facility:  medium-sized lipoma in transverse colon.    EXTRACORPOREAL SHOCK WAVE LITHOTRIPSY Left 06/13/2018   Procedure: LEFT EXTRACORPOREAL SHOCK WAVE LITHOTRIPSY (ESWL) WITH MAC;  Surgeon: Homero Luster, MD;  Location: [facility];  Service: Urology;  Laterality: Left;   repair of wrist fx Right    SPINAL CORD STIMULATOR INSERTION N/A 12/01/2020   Procedure: Lumbar spinal cord stimulator placement;  Surgeon: Annis Kinder, MD;  Location: [facility] OR;  Service: Neurosurgery;  Laterality: N/A;   TONSILLECTOMY     Allergies Allergies  Allergen Reactions   Latex Other (See Comments)   Sulfa Antibiotics Other (See Comments)   Amoxicillin-Pot Clavulanate Diarrhea and Nausea Only   Tape Rash   Home Medications    Prior to Admission medications   Medication Sig Start Date End Date Taking? Authorizing Provider  albuterol (VENTOLIN HFA) 108 (90 Base) MCG/ACT inhaler Inhale 1-2 puffs into the lungs every 6 (six) hours as needed for wheezing or shortness of breath. 12/11/16   [provider]  ALPRAZolam (XANAX) 1 MG tablet  Take 1 mg by mouth 3 (three) times daily as needed for anxiety.    [provider]  aspirin 81 MG EC tablet Take 81 mg by mouth at bedtime. 04/19/17   [provider]  atorvastatin (LIPITOR) 10 MG tablet Take 10 mg by mouth daily. 08/19/12   [provider]  cetirizine (ZYRTEC) 10 MG tablet Take 10 mg by mouth at bedtime.    [provider]  citalopram (CELEXA) 40 MG tablet Take 40 mg by mouth every evening.    [provider]  diclofenac Sodium  (VOLTAREN) 1 % GEL Apply 2 g topically daily as needed (pain). 09/08/16   [provider]  levETIRAcetam  (KEPPRA  XR) 500 MG 24 hr tablet Take 2 tablets every night 02/09/23   Jhonny Moss, MD  montelukast (SINGULAIR) 10 MG tablet Take 10 mg by mouth at bedtime.    [provider]  oxyCODONE-acetaminophen  (PERCOCET) 10-325 MG tablet Take 1-2 tablets by mouth every 4 (four) hours as needed for severe pain.     [provider]  promethazine -dextromethorphan (PROMETHAZINE -DM) 6.25-15 MG/5ML syrup Take 5 mLs by mouth every 4 (four) hours as needed for severe cough. 09/27/23     silodosin  (RAPAFLO ) 8 MG CAPS capsule Take 1 capsule (8 mg total) by mouth daily with breakfast. 06/13/18   Homero Luster, MD  tamsulosin (FLOMAX) 0.4 MG CAPS capsule Take 0.4 mg by mouth daily.    [provider]  tirzepatide  (MOUNJARO ) 12.5 MG/0.5ML Pen Inject 12.5 mg into the skin once a week. 01/01/24     tirzepatide  (MOUNJARO ) 12.5 MG/0.5ML Pen Inject 12.5 mg into the skin once a week. 01/16/24     triamcinolone  cream (KENALOG) 0.5 % Apply 1 application topically daily as needed (irritation). 04/14/20   [provider]   Physical Exam  Vital Signs:  Francie Irani does not have vital signs available for review today. Given telephonic nature of communication, physical exam is limited. AAOx3. NAD. Normal affect.  Speech and respirations are unlabored. Accessory Clinical Findings  None Assessment & Plan  1.  Preoperative Cardiovascular Risk Assessment: Left total knee arthroplasty. Mr. Arrowsmith perioperative risk of a major cardiac event is 0.9% according to the Revised Cardiac Risk Index (RCRI).  Therefore, he is at low risk for perioperative complications. His functional capacity is fair at 5.07 METs according to the [facility] Activity Status Index (DASI). Recommendations: According to ACC/AHA guidelines, no further cardiovascular testing needed.  The patient may proceed to surgery at  acceptable risk.   Antiplatelet and/or Anticoagulation Recommendations: None requested.  The patient was advised that if he develops new symptoms prior to surgery to contact our office to arrange for a follow-up visit, and he verbalized understanding. A copy of this note will be routed to requesting surgeon.  Time:   Today, I have spent 10 minutes with the patient with telehealth technology discussing medical history, symptoms, and management plan.    Stanton Kissoon D Earnestene Angello, NP  01/31/2024, 4:55 PM

## 2024-02-01 ENCOUNTER — Other Ambulatory Visit (HOSPITAL_BASED_OUTPATIENT_CLINIC_OR_DEPARTMENT_OTHER): Payer: Self-pay

## 2024-02-01 ENCOUNTER — Telehealth: Payer: Self-pay

## 2024-02-01 MED ORDER — MOUNJARO 15 MG/0.5ML ~~LOC~~ SOAJ
15.0000 mg | SUBCUTANEOUS | 11 refills | Status: AC
  Filled 2024-02-01: qty 2, 28d supply, fill #0
  Filled 2024-03-26: qty 2, 28d supply, fill #1
  Filled 2024-04-24: qty 2, 28d supply, fill #2
  Filled 2024-05-27: qty 2, 28d supply, fill #3
  Filled 2024-06-27: qty 2, 28d supply, fill #4
  Filled 2024-07-24: qty 2, 28d supply, fill #5
  Filled 2024-09-02: qty 2, 28d supply, fill #6
  Filled 2024-10-01: qty 2, 28d supply, fill #7

## 2024-02-01 NOTE — Progress Notes (Signed)
   02/01/2024  Patient ID: Seth [name], male   DOB: Sep 25, 1956, 68 y.o.   MRN: [id number]   Patient appeared on insurance report for not passing the quality metrics in 2024:  Medication Adherence for Cholesterol (MAC) Medication Adherence for Diabetes (MAD)   Outreach to the patient was successful.  Meds Tracking:  -Atorvastatin 10 mg - Last filled 90DS on 01/16/24, LDL 31 on 01/09/24. Qualifies for metric yet this year, next fill 04/15/24.  -Mounjaro  12.5 mg - Last filled 28DS on 01/02/24, A1C 5.8 on 01/09/24. Qualifies for metric this year, next fill overdue.  Plan:   Conditions remain well controlled, tolerating Mounjaro  12.5 mg well, he has 1 pen left. We discussed maximizing dose to 15 mg and he was agreeable to this. Will consult with PCP and send new rx for Mounjaro  15 mg to [facility]. Documentation will be in [employer] going forwards.   Flint Hummer, PharmD

## 2024-02-12 DIAGNOSIS — M25562 Pain in left knee: Secondary | ICD-10-CM | POA: Diagnosis not present

## 2024-02-18 NOTE — Progress Notes (Signed)
 Surgery orders requested via Epic inbox.

## 2024-02-20 ENCOUNTER — Ambulatory Visit: Payer: Self-pay | Admitting: Emergency Medicine

## 2024-02-20 DIAGNOSIS — G8929 Other chronic pain: Secondary | ICD-10-CM

## 2024-02-20 NOTE — H&P (View-Only) (Signed)
 TOTAL KNEE ADMISSION H&P  Patient is being admitted for left total knee arthroplasty.  Subjective:  Chief Complaint:left knee pain.  HPI: Seth [name], 68 y.o. male, has a history of pain and functional disability in the left knee due to arthritis and has failed non-surgical conservative treatments for greater than 12 weeks to includeNSAID's and/or analgesics, corticosteriod injections, use of assistive devices, and activity modification.  Onset of symptoms was gradual, starting 4 years ago with gradually worsening course since that time. The patient noted no past surgery on the left knee(s).  Patient currently rates pain in the left knee(s) at 8 out of 10 with activity. Patient has night pain, worsening of pain with activity and weight bearing, pain that interferes with activities of daily living, and pain with passive range of motion.  Patient has evidence of periarticular osteophytes and joint space narrowing by imaging studies.  . There is no active infection.  Patient Active Problem List   Diagnosis Date Noted   OSA (obstructive sleep apnea) 05/28/2023   Orthostatic hypotension 02/08/2023   Screening for colorectal cancer 09/02/2020   Knee pain 07/15/2020   Sciatica of right side 04/19/2017   TIA (transient ischemic attack) 04/19/2017   Adjustment disorder with anxious mood 03/23/2016   Opioid type dependence, continuous (HCC) 12/08/2015   Seizure prophylaxis 10/13/2015   Allergic rhinitis 12/17/2014   Morbid obesity (HCC) 05/06/2014   Anxiety 07/17/2012   Depression 07/17/2012   Essential hypertension 07/17/2012   Intraparenchymal hemorrhage of brain (HCC) 07/16/2012   Past Medical History:  Diagnosis Date   Anxiety    Brain bleed [facility])    Intracerebral bleed - [employer] 2013   Bronchitis    Depression    History of kidney stones    Hypertension    Osteoarthritis of both knees    Pre-diabetes    Sleep apnea    dx. at one time, then they said no- mild, no cpap    Transient ischemic attack (TIA)     Past Surgical History:  Procedure Laterality Date   ACHILLES TENDON SURGERY Right 1994   BARIATRIC SURGERY     2005   cataracts Bilateral 2008   CHOLECYSTECTOMY     COLONOSCOPY  2015   Outside facility:  medium-sized lipoma in transverse colon.    EXTRACORPOREAL SHOCK WAVE LITHOTRIPSY Left 06/13/2018   Procedure: LEFT EXTRACORPOREAL SHOCK WAVE LITHOTRIPSY (ESWL) WITH MAC;  Surgeon: Homero Luster, MD;  Location: [facility];  Service: Urology;  Laterality: Left;   repair of wrist fx Right    SPINAL CORD STIMULATOR INSERTION N/A 12/01/2020   Procedure: Lumbar spinal cord stimulator placement;  Surgeon: Annis Kinder, MD;  Location: [facility] OR;  Service: Neurosurgery;  Laterality: N/A;   TONSILLECTOMY      Current Outpatient Medications  Medication Sig Dispense Refill Last Dose/Taking   albuterol (VENTOLIN HFA) 108 (90 Base) MCG/ACT inhaler Inhale 1-2 puffs into the lungs every 6 (six) hours as needed for wheezing or shortness of breath.      ALPRAZolam (XANAX) 1 MG tablet Take 1 mg by mouth 3 (three) times daily as needed for anxiety.      aspirin 81 MG EC tablet Take 81 mg by mouth at bedtime.      atorvastatin (LIPITOR) 10 MG tablet Take 10 mg by mouth every evening.      cetirizine (ZYRTEC) 10 MG tablet Take 10 mg by mouth at bedtime.      citalopram (CELEXA) 40 MG tablet Take 40  mg by mouth every evening.      diclofenac Sodium (VOLTAREN) 1 % GEL Apply 2 g topically 4 (four) times daily as needed (pain).      levETIRAcetam  (KEPPRA  XR) 500 MG 24 hr tablet Take 2 tablets every night 180 tablet 3    loratadine (CLARITIN) 10 MG tablet Take 10 mg by mouth daily.      montelukast (SINGULAIR) 10 MG tablet Take 10 mg by mouth at bedtime.      oxyCODONE-acetaminophen  (PERCOCET) 10-325 MG tablet Take 1-2 tablets by mouth every 4 (four) hours as needed for severe pain.       Polyethyl Glycol-Propyl Glycol (SYSTANE) 0.4-0.3 % SOLN Place 1-2 drops into both eyes daily as  needed (dry/irritated eyes).      tamsulosin (FLOMAX) 0.4 MG CAPS capsule Take 0.4 mg by mouth in the morning and at bedtime.      tirzepatide  (MOUNJARO ) 12.5 MG/0.5ML Pen Inject 12.5 mg into the skin once a week. (Patient not taking: Reported on 02/15/2024) 2 mL 2    tirzepatide  (MOUNJARO ) 15 MG/0.5ML Pen Inject 15 mg into the skin once a week. (Patient taking differently: Inject 15 mg into the skin every Wednesday.) 2 mL 11    triamcinolone  cream (KENALOG) 0.5 % Apply 1 application  topically daily as needed (ear irritation).      Vibegron (GEMTESA) 75 MG TABS Take 75 mg by mouth every evening.      No current facility-administered medications for this visit.   Allergies  Allergen Reactions   Latex Other (See Comments)   Sulfa Antibiotics Other (See Comments)   Amoxicillin-Pot Clavulanate Diarrhea and Nausea Only   Tape Rash    Social History   Tobacco Use   Smoking status: Never   Smokeless tobacco: Never  Substance Use Topics   Alcohol use: Yes    Comment: occ    Family History  Problem Relation Age of Onset   Emphysema Mother    Lung cancer Father    Heart attack Brother    Colon cancer Neg Hx    Colon polyps Neg Hx      Review of Systems  Musculoskeletal:  Positive for arthralgias.  All other systems reviewed and are negative.   Objective:  Physical Exam Constitutional:      General: He is not in acute distress.    Appearance: Normal appearance. He is normal weight.  HENT:     Head: Normocephalic and atraumatic.  Eyes:     Extraocular Movements: Extraocular movements intact.     Conjunctiva/sclera: Conjunctivae normal.     Pupils: Pupils are equal, round, and reactive to light.  Cardiovascular:     Rate and Rhythm: Normal rate and regular rhythm.     Pulses: Normal pulses.     Heart sounds: Normal heart sounds.  Pulmonary:     Effort: Pulmonary effort is normal. No respiratory distress.     Breath sounds: Normal breath sounds.  Abdominal:     General:  Bowel sounds are normal. There is no distension.     Palpations: Abdomen is soft.     Tenderness: There is no abdominal tenderness.  Musculoskeletal:        General: Tenderness present.     Cervical back: Normal range of motion and neck supple.     Comments: TTP over medial and lateral joint line, medial worse than lateral.  No calf tenderness, swelling, or erythema.  No overlying lesions of area of chief complaint.  Decreased strength  and ROM due to elicited pain.  Pre-operative ROM 5-120.  Dorsiflexion and plantarflexion intact.  Stable to varus and valgus stress.  BLE appear grossly neurovascularly intact.  Gait mildly antalgic.   Lymphadenopathy:     Cervical: No cervical adenopathy.  Skin:    General: Skin is warm and dry.     Capillary Refill: Capillary refill takes less than 2 seconds.     Findings: No erythema or rash.  Neurological:     General: No focal deficit present.     Mental Status: He is alert and oriented to person, place, and time.  Psychiatric:        Mood and Affect: Mood normal.        Behavior: Behavior normal.     Vital signs in last 24 hours: @VSRANGES @  Labs:   Estimated body mass index is 41.85 kg/m as calculated from the following:   Height as of 05/28/23: 6' (1.829 m).   Weight as of 05/28/23: 140 kg.   Imaging Review Plain radiographs demonstrate severe degenerative joint disease of the left knee(s). The overall alignment issignificant varus. The bone quality appears to be fair for age and reported activity level.      Assessment/Plan:  End stage arthritis, left knee   The patient history, physical examination, clinical judgment of the provider and imaging studies are consistent with end stage degenerative joint disease of the left knee(s) and total knee arthroplasty is deemed medically necessary. The treatment options including medical management, injection therapy arthroscopy and arthroplasty were discussed at length. The risks and benefits  of total knee arthroplasty were presented and reviewed. The risks due to aseptic loosening, infection, stiffness, patella tracking problems, thromboembolic complications and other imponderables were discussed. The patient acknowledged the explanation, agreed to proceed with the plan and consent was signed. Patient is being admitted for inpatient treatment for surgery, pain control, PT, OT, prophylactic antibiotics, VTE prophylaxis, progressive ambulation and ADL's and discharge planning. The patient is planning to be discharged home with HHPT.    Anticipated LOS equal to or greater than 2 midnights due to - Age 4 and older with one or more of the following:  - Obesity  - Expected need for hospital services (PT, OT, Nursing) required for safe  discharge  - Anticipated need for postoperative skilled nursing care or inpatient rehab  - Active co-morbidities: prediabetes, HTN, HLF, asthma, anxiety, depression, erectile dysfunction, chronic pain syndrome, overactive bladder, seizure disorder, hx of TIA OR   - Unanticipated findings during/Post Surgery: None  - Patient is a high risk of re-admission due to: None

## 2024-02-20 NOTE — H&P (Signed)
 TOTAL KNEE ADMISSION H&P  Patient is being admitted for left total knee arthroplasty.  Subjective:  Chief Complaint:left knee pain.  HPI: Seth [name], 68 y.o. male, has a history of pain and functional disability in the left knee due to arthritis and has failed non-surgical conservative treatments for greater than 12 weeks to includeNSAID's and/or analgesics, corticosteriod injections, use of assistive devices, and activity modification.  Onset of symptoms was gradual, starting 4 years ago with gradually worsening course since that time. The patient noted no past surgery on the left knee(s).  Patient currently rates pain in the left knee(s) at 8 out of 10 with activity. Patient has night pain, worsening of pain with activity and weight bearing, pain that interferes with activities of daily living, and pain with passive range of motion.  Patient has evidence of periarticular osteophytes and joint space narrowing by imaging studies.  . There is no active infection.  Patient Active Problem List   Diagnosis Date Noted   OSA (obstructive sleep apnea) 05/28/2023   Orthostatic hypotension 02/08/2023   Screening for colorectal cancer 09/02/2020   Knee pain 07/15/2020   Sciatica of right side 04/19/2017   TIA (transient ischemic attack) 04/19/2017   Adjustment disorder with anxious mood 03/23/2016   Opioid type dependence, continuous (HCC) 12/08/2015   Seizure prophylaxis 10/13/2015   Allergic rhinitis 12/17/2014   Morbid obesity (HCC) 05/06/2014   Anxiety 07/17/2012   Depression 07/17/2012   Essential hypertension 07/17/2012   Intraparenchymal hemorrhage of brain (HCC) 07/16/2012   Past Medical History:  Diagnosis Date   Anxiety    Brain bleed [facility])    Intracerebral bleed - [employer] 2013   Bronchitis    Depression    History of kidney stones    Hypertension    Osteoarthritis of both knees    Pre-diabetes    Sleep apnea    dx. at one time, then they said no- mild, no cpap    Transient ischemic attack (TIA)     Past Surgical History:  Procedure Laterality Date   ACHILLES TENDON SURGERY Right 1994   BARIATRIC SURGERY     2005   cataracts Bilateral 2008   CHOLECYSTECTOMY     COLONOSCOPY  2015   Outside facility:  medium-sized lipoma in transverse colon.    EXTRACORPOREAL SHOCK WAVE LITHOTRIPSY Left 06/13/2018   Procedure: LEFT EXTRACORPOREAL SHOCK WAVE LITHOTRIPSY (ESWL) WITH MAC;  Surgeon: Homero Luster, MD;  Location: [facility];  Service: Urology;  Laterality: Left;   repair of wrist fx Right    SPINAL CORD STIMULATOR INSERTION N/A 12/01/2020   Procedure: Lumbar spinal cord stimulator placement;  Surgeon: Annis Kinder, MD;  Location: [facility] OR;  Service: Neurosurgery;  Laterality: N/A;   TONSILLECTOMY      Current Outpatient Medications  Medication Sig Dispense Refill Last Dose/Taking   albuterol (VENTOLIN HFA) 108 (90 Base) MCG/ACT inhaler Inhale 1-2 puffs into the lungs every 6 (six) hours as needed for wheezing or shortness of breath.      ALPRAZolam (XANAX) 1 MG tablet Take 1 mg by mouth 3 (three) times daily as needed for anxiety.      aspirin 81 MG EC tablet Take 81 mg by mouth at bedtime.      atorvastatin (LIPITOR) 10 MG tablet Take 10 mg by mouth every evening.      cetirizine (ZYRTEC) 10 MG tablet Take 10 mg by mouth at bedtime.      citalopram (CELEXA) 40 MG tablet Take 40  mg by mouth every evening.      diclofenac Sodium (VOLTAREN) 1 % GEL Apply 2 g topically 4 (four) times daily as needed (pain).      levETIRAcetam  (KEPPRA  XR) 500 MG 24 hr tablet Take 2 tablets every night 180 tablet 3    loratadine (CLARITIN) 10 MG tablet Take 10 mg by mouth daily.      montelukast (SINGULAIR) 10 MG tablet Take 10 mg by mouth at bedtime.      oxyCODONE-acetaminophen  (PERCOCET) 10-325 MG tablet Take 1-2 tablets by mouth every 4 (four) hours as needed for severe pain.       Polyethyl Glycol-Propyl Glycol (SYSTANE) 0.4-0.3 % SOLN Place 1-2 drops into both eyes daily as  needed (dry/irritated eyes).      tamsulosin (FLOMAX) 0.4 MG CAPS capsule Take 0.4 mg by mouth in the morning and at bedtime.      tirzepatide  (MOUNJARO ) 12.5 MG/0.5ML Pen Inject 12.5 mg into the skin once a week. (Patient not taking: Reported on 02/15/2024) 2 mL 2    tirzepatide  (MOUNJARO ) 15 MG/0.5ML Pen Inject 15 mg into the skin once a week. (Patient taking differently: Inject 15 mg into the skin every Wednesday.) 2 mL 11    triamcinolone  cream (KENALOG) 0.5 % Apply 1 application  topically daily as needed (ear irritation).      Vibegron (GEMTESA) 75 MG TABS Take 75 mg by mouth every evening.      No current facility-administered medications for this visit.   Allergies  Allergen Reactions   Latex Other (See Comments)   Sulfa Antibiotics Other (See Comments)   Amoxicillin-Pot Clavulanate Diarrhea and Nausea Only   Tape Rash    Social History   Tobacco Use   Smoking status: Never   Smokeless tobacco: Never  Substance Use Topics   Alcohol use: Yes    Comment: occ    Family History  Problem Relation Age of Onset   Emphysema Mother    Lung cancer Father    Heart attack Brother    Colon cancer Neg Hx    Colon polyps Neg Hx      Review of Systems  Musculoskeletal:  Positive for arthralgias.  All other systems reviewed and are negative.   Objective:  Physical Exam Constitutional:      General: He is not in acute distress.    Appearance: Normal appearance. He is normal weight.  HENT:     Head: Normocephalic and atraumatic.  Eyes:     Extraocular Movements: Extraocular movements intact.     Conjunctiva/sclera: Conjunctivae normal.     Pupils: Pupils are equal, round, and reactive to light.  Cardiovascular:     Rate and Rhythm: Normal rate and regular rhythm.     Pulses: Normal pulses.     Heart sounds: Normal heart sounds.  Pulmonary:     Effort: Pulmonary effort is normal. No respiratory distress.     Breath sounds: Normal breath sounds.  Abdominal:     General:  Bowel sounds are normal. There is no distension.     Palpations: Abdomen is soft.     Tenderness: There is no abdominal tenderness.  Musculoskeletal:        General: Tenderness present.     Cervical back: Normal range of motion and neck supple.     Comments: TTP over medial and lateral joint line, medial worse than lateral.  No calf tenderness, swelling, or erythema.  No overlying lesions of area of chief complaint.  Decreased strength  and ROM due to elicited pain.  Pre-operative ROM 5-120.  Dorsiflexion and plantarflexion intact.  Stable to varus and valgus stress.  BLE appear grossly neurovascularly intact.  Gait mildly antalgic.   Lymphadenopathy:     Cervical: No cervical adenopathy.  Skin:    General: Skin is warm and dry.     Capillary Refill: Capillary refill takes less than 2 seconds.     Findings: No erythema or rash.  Neurological:     General: No focal deficit present.     Mental Status: He is alert and oriented to person, place, and time.  Psychiatric:        Mood and Affect: Mood normal.        Behavior: Behavior normal.     Vital signs in last 24 hours: @VSRANGES @  Labs:   Estimated body mass index is 41.85 kg/m as calculated from the following:   Height as of 05/28/23: 6' (1.829 m).   Weight as of 05/28/23: 140 kg.   Imaging Review Plain radiographs demonstrate severe degenerative joint disease of the left knee(s). The overall alignment issignificant varus. The bone quality appears to be fair for age and reported activity level.      Assessment/Plan:  End stage arthritis, left knee   The patient history, physical examination, clinical judgment of the provider and imaging studies are consistent with end stage degenerative joint disease of the left knee(s) and total knee arthroplasty is deemed medically necessary. The treatment options including medical management, injection therapy arthroscopy and arthroplasty were discussed at length. The risks and benefits  of total knee arthroplasty were presented and reviewed. The risks due to aseptic loosening, infection, stiffness, patella tracking problems, thromboembolic complications and other imponderables were discussed. The patient acknowledged the explanation, agreed to proceed with the plan and consent was signed. Patient is being admitted for inpatient treatment for surgery, pain control, PT, OT, prophylactic antibiotics, VTE prophylaxis, progressive ambulation and ADL's and discharge planning. The patient is planning to be discharged home with HHPT.    Anticipated LOS equal to or greater than 2 midnights due to - Age 4 and older with one or more of the following:  - Obesity  - Expected need for hospital services (PT, OT, Nursing) required for safe  discharge  - Anticipated need for postoperative skilled nursing care or inpatient rehab  - Active co-morbidities: prediabetes, HTN, HLF, asthma, anxiety, depression, erectile dysfunction, chronic pain syndrome, overactive bladder, seizure disorder, hx of TIA OR   - Unanticipated findings during/Post Surgery: None  - Patient is a high risk of re-admission due to: None

## 2024-02-21 NOTE — Patient Instructions (Signed)
 SURGICAL WAITING ROOM VISITATION  Patients having surgery or a procedure may have no more than 2 support people in the waiting area - these visitors may rotate.    Children under the age of 59 must have an adult with them who is not the patient.  Due to an increase in RSV and influenza rates and associated hospitalizations, children ages 73 and under may not visit patients in [facility] hospitals.  Visitors with respiratory illnesses are discouraged from visiting and should remain at home.  If the patient needs to stay at the hospital during part of their recovery, the visitor guidelines for inpatient rooms apply. Pre-op nurse will coordinate an appropriate time for 1 support person to accompany patient in pre-op.  This support person may not rotate.    Please refer to the [facility] website for the visitor guidelines for Inpatients (after your surgery is over and you are in a regular room).       Your procedure is scheduled on: 03/03/24   Report to [facility] Main Entrance    Report to admitting at : 5:15 AM   Call this number if you have problems the morning of surgery [phone]   Do not eat food :After Midnight.   After Midnight you may have the following liquids until : 4:30 AM DAY OF SURGERY  Water Non-Citrus Juices (without pulp, NO RED-Apple, White grape, White cranberry) Black Coffee (NO MILK/CREAM OR CREAMERS, sugar ok)  Clear Tea (NO MILK/CREAM OR CREAMERS, sugar ok) regular and decaf                             Plain Jell-O (NO RED)                                           Fruit ices (not with fruit pulp, NO RED)                                     Popsicles (NO RED)                                                               Sports drinks like Gatorade (NO RED)   The day of surgery:  Drink ONE (1) Pre-Surgery Clear G2 at : 4:30 AM the morning of surgery. Drink in one sitting. Do not sip.  This drink was given to you during your hospital  pre-op  appointment visit. Nothing else to drink after completing the  Pre-Surgery Clear Ensure or G2.          If you have questions, please contact your surgeon's office.  FOLLOW ANY ADDITIONAL PRE OP INSTRUCTIONS YOU RECEIVED FROM YOUR SURGEON'S OFFICE!!!   Oral Hygiene is also important to reduce your risk of infection.                                    Remember - BRUSH YOUR TEETH THE MORNING OF SURGERY WITH YOUR REGULAR TOOTHPASTE  DENTURES WILL BE REMOVED PRIOR TO SURGERY PLEASE DO NOT APPLY "Poly grip" OR ADHESIVES!!!   Do NOT smoke after Midnight   Stop all vitamins and herbal supplements 7 days before surgery.   Take these medicines the morning of surgery with A SIP OF WATER: kepra,cetirizine,tamsulosin.Use inhalers as usual and bring them.  DO NOT TAKE THE FOLLOWING 7 DAYS PRIOR TO SURGERY: Ozempic, Wegovy, Rybelsus (Semaglutide), Byetta (exenatide), Bydureon (exenatide ER), Victoza, Saxenda (liraglutide), or Trulicity (dulaglutide) Mounjaro  (Tirzepatide ) Adlyxin (Lixisenatide), Polyethylene Glycol Loxenatide.  DO NOT TAKE ANY ORAL DIABETIC MEDICATIONS DAY OF YOUR SURGERY  Bring CPAP mask and tubing day of surgery.                              You may not have any metal on your body including hair pins, jewelry, and body piercing             Do not wear lotions, powders, perfumes/cologne, or deodorant              Men may shave face and neck.   Do not bring valuables to the hospital. [facility] IS NOT             RESPONSIBLE   FOR VALUABLES.   Contacts, glasses, dentures or bridgework may not be worn into surgery.   Bring small overnight bag day of surgery.   DO NOT BRING YOUR HOME MEDICATIONS TO THE HOSPITAL. PHARMACY WILL DISPENSE MEDICATIONS LISTED ON YOUR MEDICATION LIST TO YOU DURING YOUR ADMISSION IN THE HOSPITAL!    Patients discharged on the day of surgery will not be allowed to drive home.  Someone NEEDS to stay with you for the first 24 hours after  anesthesia.   Special Instructions: Bring a copy of your healthcare power of attorney and living will documents the day of surgery if you haven't scanned them before.              Please read over the following fact sheets you were given: IF YOU HAVE QUESTIONS ABOUT YOUR PRE-OP INSTRUCTIONS PLEASE CALL [phone]   If you received a COVID test during your pre-op visit  it is requested that you wear a mask when out in public, stay away from anyone that may not be feeling well and notify your surgeon if you develop symptoms. If you test positive for Covid or have been in contact with anyone that has tested positive in the last 10 days please notify you surgeon.      Pre-operative 5 CHG Bath Instructions   You can play a key role in reducing the risk of infection after surgery. Your skin needs to be as free of germs as possible. You can reduce the number of germs on your skin by washing with CHG (chlorhexidine  gluconate) soap before surgery. CHG is an antiseptic soap that kills germs and continues to kill germs even after washing.   DO NOT use if you have an allergy to chlorhexidine /CHG or antibacterial soaps. If your skin becomes reddened or irritated, stop using the CHG and notify one of our RNs at [phone].   Please shower with the CHG soap starting 4 days before surgery using the following schedule:     Please keep in mind the following:  DO NOT shave, including legs and underarms, starting the day of your first shower.   You may shave your face at any point before/day of surgery.  Place clean sheets  on your bed the day you start using CHG soap. Use a clean washcloth (not used since being washed) for each shower. DO NOT sleep with pets once you start using the CHG.   CHG Shower Instructions:  If you choose to wash your hair and private area, wash first with your normal shampoo/soap.  After you use shampoo/soap, rinse your hair and body thoroughly to remove shampoo/soap residue.   Turn the water OFF and apply about 3 tablespoons (45 ml) of CHG soap to a CLEAN washcloth.  Apply CHG soap ONLY FROM YOUR NECK DOWN TO YOUR TOES (washing for 3-5 minutes)  DO NOT use CHG soap on face, private areas, open wounds, or sores.  Pay special attention to the area where your surgery is being performed.  If you are having back surgery, having someone wash your back for you may be helpful. Wait 2 minutes after CHG soap is applied, then you may rinse off the CHG soap.  Pat dry with a clean towel  Put on clean clothes/pajamas   If you choose to wear lotion, please use ONLY the CHG-compatible lotions on the back of this paper.     Additional instructions for the day of surgery: DO NOT APPLY any lotions, deodorants, cologne, or perfumes.   Put on clean/comfortable clothes.  Brush your teeth.  Ask your nurse before applying any prescription medications to the skin.   CHG Compatible Lotions   Aveeno Moisturizing lotion  Cetaphil Moisturizing Cream  Cetaphil Moisturizing Lotion  Clairol Herbal Essence Moisturizing Lotion, Dry Skin  Clairol Herbal Essence Moisturizing Lotion, Extra Dry Skin  Clairol Herbal Essence Moisturizing Lotion, Normal Skin  Curel Age Defying Therapeutic Moisturizing Lotion with Alpha Hydroxy  Curel Extreme Care Body Lotion  Curel Soothing Hands Moisturizing Hand Lotion  Curel Therapeutic Moisturizing Cream, Fragrance-Free  Curel Therapeutic Moisturizing Lotion, Fragrance-Free  Curel Therapeutic Moisturizing Lotion, Original Formula  Eucerin Daily Replenishing Lotion  Eucerin Dry Skin Therapy Plus Alpha Hydroxy Crme  Eucerin Dry Skin Therapy Plus Alpha Hydroxy Lotion  Eucerin Original Crme  Eucerin Original Lotion  Eucerin Plus Crme Eucerin Plus Lotion  Eucerin TriLipid Replenishing Lotion  Keri Anti-Bacterial Hand Lotion  Keri Deep Conditioning Original Lotion Dry Skin Formula Softly Scented  Keri Deep Conditioning Original Lotion, Fragrance Free  Sensitive Skin Formula  Keri Lotion Fast Absorbing Fragrance Free Sensitive Skin Formula  Keri Lotion Fast Absorbing Softly Scented Dry Skin Formula  Keri Original Lotion  Keri Skin Renewal Lotion Keri Silky Smooth Lotion  Keri Silky Smooth Sensitive Skin Lotion  Nivea Body Creamy Conditioning Oil  Nivea Body Extra Enriched Lotion  Nivea Body Original Lotion  Nivea Body Sheer Moisturizing Lotion Nivea Crme  Nivea Skin Firming Lotion  NutraDerm 30 Skin Lotion  NutraDerm Skin Lotion  NutraDerm Therapeutic Skin Cream  NutraDerm Therapeutic Skin Lotion  ProShield Protective Hand Cream  Provon moisturizing lotion   Incentive Spirometer  An incentive spirometer is a tool that can help keep your lungs clear and active. This tool measures how well you are filling your lungs with each breath. Taking long deep breaths may help reverse or decrease the chance of developing breathing (pulmonary) problems (especially infection) following: A long period of time when you are unable to move or be active. BEFORE THE PROCEDURE  If the spirometer includes an indicator to show your best effort, your nurse or respiratory therapist will set it to a desired goal. If possible, sit up straight or lean slightly forward. Try not to slouch.  Hold the incentive spirometer in an upright position. INSTRUCTIONS FOR USE  Sit on the edge of your bed if possible, or sit up as far as you can in bed or on a chair. Hold the incentive spirometer in an upright position. Breathe out normally. Place the mouthpiece in your mouth and seal your lips tightly around it. Breathe in slowly and as deeply as possible, raising the piston or the ball toward the top of the column. Hold your breath for 3-5 seconds or for as long as possible. Allow the piston or ball to fall to the bottom of the column. Remove the mouthpiece from your mouth and breathe out normally. Rest for a few seconds and repeat Steps 1 through 7 at least 10 times  every 1-2 hours when you are awake. Take your time and take a few normal breaths between deep breaths. The spirometer may include an indicator to show your best effort. Use the indicator as a goal to work toward during each repetition. After each set of 10 deep breaths, practice coughing to be sure your lungs are clear. If you have an incision (the cut made at the time of surgery), support your incision when coughing by placing a pillow or rolled up towels firmly against it. Once you are able to get out of bed, walk around indoors and cough well. You may stop using the incentive spirometer when instructed by your caregiver.  RISKS AND COMPLICATIONS Take your time so you do not get dizzy or light-headed. If you are in pain, you may need to take or ask for pain medication before doing incentive spirometry. It is harder to take a deep breath if you are having pain. AFTER USE Rest and breathe slowly and easily. It can be helpful to keep track of a log of your progress. Your caregiver can provide you with a simple table to help with this. If you are using the spirometer at home, follow these instructions: SEEK MEDICAL CARE IF:  You are having difficultly using the spirometer. You have trouble using the spirometer as often as instructed. Your pain medication is not giving enough relief while using the spirometer. You develop fever of 100.5 F (38.1 C) or higher. SEEK IMMEDIATE MEDICAL CARE IF:  You cough up bloody sputum that had not been present before. You develop fever of 102 F (38.9 C) or greater. You develop worsening pain at or near the incision site. MAKE SURE YOU:  Understand these instructions. Will watch your condition. Will get help right away if you are not doing well or get worse. Document Released: 01/29/2007 Document Revised: 12/11/2011 Document Reviewed: 04/01/2007 [facility] Patient Information 2014 [place],  [place].   ________________________________________________________________________

## 2024-02-21 NOTE — Care Plan (Signed)
 Ortho Bundle Case Management Note  Patient Details  Name: Seth [name] MRN: [id number] Date of Birth: 09-23-1956  met with patient in the office for H&P. he will discharge to home with friends to assist. HHPT referral to [facility] and OPPT set up with [facility]. has RW, requested CPM. discharge instructions discussed and questions answered.  Patient and MD in agreement with plan. Choice offered.             DME Arranged:  CPM DME Agency:  [employer]  HH Arranged:  PT HH Agency:  [facility]  Additional Comments: Please contact me with any questions of if this plan should need to change.  Cornelia Dieter,  RN,BSN,MHA,CCM  [facility]opaedic Specialist  [phone] 02/21/2024, 2:32 PM

## 2024-02-22 ENCOUNTER — Other Ambulatory Visit: Payer: Self-pay

## 2024-02-22 ENCOUNTER — Encounter (HOSPITAL_COMMUNITY): Payer: Self-pay

## 2024-02-22 ENCOUNTER — Encounter (HOSPITAL_COMMUNITY)
Admission: RE | Admit: 2024-02-22 | Discharge: 2024-02-22 | Disposition: A | Discharge status: Home / Self Care | Source: Ambulatory Visit | Attending: Orthopedic Surgery | Admitting: Orthopedic Surgery

## 2024-02-22 VITALS — BP 90/69 | HR 132 | Temp 97.6°F | Ht 72.0 in | Wt 281.0 lb

## 2024-02-22 DIAGNOSIS — E119 Type 2 diabetes mellitus without complications: Secondary | ICD-10-CM | POA: Insufficient documentation

## 2024-02-22 DIAGNOSIS — Z01818 Encounter for other preprocedural examination: Secondary | ICD-10-CM | POA: Diagnosis not present

## 2024-02-22 DIAGNOSIS — M25562 Pain in left knee: Secondary | ICD-10-CM | POA: Diagnosis not present

## 2024-02-22 DIAGNOSIS — G8929 Other chronic pain: Secondary | ICD-10-CM | POA: Insufficient documentation

## 2024-02-22 DIAGNOSIS — I10 Essential (primary) hypertension: Secondary | ICD-10-CM | POA: Diagnosis not present

## 2024-02-22 LAB — COMPREHENSIVE METABOLIC PANEL WITH GFR
ALT: 24 U/L (ref 0–44)
AST: 29 U/L (ref 15–41)
Albumin: 3.6 g/dL (ref 3.5–5.0)
Alkaline Phosphatase: 78 U/L (ref 38–126)
Anion gap: 7 (ref 5–15)
BUN: 22 mg/dL (ref 8–23)
CO2: 23 mmol/L (ref 22–32)
Calcium: 8.6 mg/dL — ABNORMAL LOW (ref 8.9–10.3)
Chloride: 106 mmol/L (ref 98–111)
Creatinine, Ser: 0.96 mg/dL (ref 0.61–1.24)
GFR, Estimated: >60 mL/min (ref >60)
Glucose, Bld: 105 mg/dL — ABNORMAL HIGH (ref 70–99)
Potassium: 4.3 mmol/L (ref 3.5–5.1)
Sodium: 136 mmol/L (ref 135–145)
Total Bilirubin: 0.8 mg/dL (ref 0.0–1.2)
Total Protein: 6.7 g/dL (ref 6.5–8.1)

## 2024-02-22 LAB — CBC WITH DIFFERENTIAL/PLATELET
Abs Immature Granulocytes: 0.02 10*3/uL (ref 0.00–0.07)
Basophils Absolute: 0.1 10*3/uL (ref 0.0–0.1)
Basophils Relative: 1 %
Eosinophils Absolute: 0.2 10*3/uL (ref 0.0–0.5)
Eosinophils Relative: 3 %
HCT: 46.2 % (ref 39.0–52.0)
Hemoglobin: 14.4 g/dL (ref 13.0–17.0)
Immature Granulocytes: 0 %
Lymphocytes Relative: 20 %
Lymphs Abs: 1.4 10*3/uL (ref 0.7–4.0)
MCH: 25.9 pg — ABNORMAL LOW (ref 26.0–34.0)
MCHC: 31.2 g/dL (ref 30.0–36.0)
MCV: 82.9 fL (ref 80.0–100.0)
Monocytes Absolute: 0.5 10*3/uL (ref 0.1–1.0)
Monocytes Relative: 8 %
Neutro Abs: 4.8 10*3/uL (ref 1.7–7.7)
Neutrophils Relative %: 68 %
Platelets: 291 10*3/uL (ref 150–400)
RBC: 5.57 MIL/uL (ref 4.22–5.81)
RDW: 16.1 % — ABNORMAL HIGH (ref 11.5–15.5)
WBC: 7 10*3/uL (ref 4.0–10.5)
nRBC: 0 % (ref 0.0–0.2)

## 2024-02-22 LAB — SURGICAL PCR SCREEN
MRSA, PCR: NEGATIVE
Staphylococcus aureus: NEGATIVE

## 2024-02-22 LAB — HEMOGLOBIN A1C
Hgb A1c MFr Bld: 5.4 % (ref 4.8–5.6)
Mean Plasma Glucose: 108.28 mg/dL

## 2024-02-22 LAB — GLUCOSE, CAPILLARY: Glucose-Capillary: 99 mg/dL (ref 70–99)

## 2024-02-22 NOTE — Progress Notes (Addendum)
 For Anesthesia: PCP - Omie Bickers, MD  Cardiologist - Lasalle Pointer, MD. Holli Lunger: 02/08/23 Clearance: Katlyn West: NP: 01/31/24 Bowel Prep reminder:  Chest x-ray -  EKG - 02/22/24 Stress Test -  ECHO -  Cardiac Cath -  Pacemaker/ICD device last checked: Pacemaker orders received: Device Rep notified:  Spinal Cord Stimulator:Yes (does not work)  Sleep Study - Yes CPAP - NO  Fasting Blood Sugar - N/A Checks Blood Sugar __0___ times a day Date and result of last Hgb A1c-  Last dose of GLP1 agonist- Mounjaro . Last dose: 02/23/24 GLP1 instructions:   Last dose of SGLT-2 inhibitors- N/A SGLT-2 instructions:   Blood Thinner Instructions: Aspirin Instructions:To hold it after: 02/22/24 Last Dose:  Activity level: Can go up a flight of stairs and activities of daily living without stopping and without chest pain and/or shortness of breath      Unable to go up a flight of stairs due to knee pain    Anesthesia review: Hx: HTN,DIA,OSA(NO CPAP),TIA's. BP during PST were low: 93/78 P: 131; 85/63 P; 132; manual: 90/69. Jay Meth: PA was notified,instructions were received.  Patient denies shortness of breath, fever, cough and chest pain at PAT appointment   Patient verbalized understanding of instructions that were given to them at the PAT appointment. Patient was also instructed that they will need to review over the PAT instructions again at home before surgery.

## 2024-02-26 NOTE — Progress Notes (Signed)
 Anesthesia Chart Review   Case: [id number] Date/Time: 03/03/24 0715   Procedure: ARTHROPLASTY, KNEE, TOTAL (Left: Knee)   Anesthesia type: Spinal   Pre-op diagnosis: OA LEFT KNEE   Location: [facility] ROOM 08 / [facility]   Surgeons: Murleen Arms, MD       DISCUSSION:68 y.o. never smoker with h/o HTN, sleep apnea, TIA, DM II, left knee OA scheduled for above procedure 03/03/24 with Dr. Priscille Brought.   Per cardiology preoperative evaluation 01/31/2024, "Preoperative Cardiovascular Risk Assessment: Left total knee arthroplasty. Mr. Seth [name] perioperative risk of a major cardiac event is 0.9% according to the Revised Cardiac Risk Index (RCRI).  Therefore, he is at low risk for perioperative complications. His functional capacity is fair at 5.07 METs according to the [employer] Activity Status Index (DASI). Recommendations: According to ACC/AHA guidelines, no further cardiovascular testing needed.  The patient may proceed to surgery at acceptable risk."  VS: BP 90/69 Comment: manual  Pulse (!) 132   Temp 36.4 C (Oral)   Ht 6' (1.829 m)   Wt 127.5 kg   SpO2 97%   BMI 38.11 kg/m   PROVIDERS: Omie Bickers, MD is PCP   Primary Cardiologist:  Lasalle Pointer, MD  LABS: Labs reviewed: Acceptable for surgery. (all labs ordered are listed, but only abnormal results are displayed)  Labs Reviewed  CBC WITH DIFFERENTIAL/PLATELET - Abnormal; Notable for the following components:      Result Value   MCH 25.9 (*)    RDW 16.1 (*)    All other components within normal limits  COMPREHENSIVE METABOLIC PANEL WITH GFR - Abnormal; Notable for the following components:   Glucose, Bld 105 (*)    Calcium 8.6 (*)    All other components within normal limits  SURGICAL PCR SCREEN  HEMOGLOBIN A1C  GLUCOSE, CAPILLARY  TYPE AND SCREEN     IMAGES:   EKG:   CV:  Past Medical History:  Diagnosis Date   Anxiety    Brain bleed (HCC)    Intracerebral bleed - [employer] 2013   Bronchitis     Depression    Diabetes mellitus without complication (HCC)    History of kidney stones    Hypertension    Osteoarthritis of both knees    Pre-diabetes    Sleep apnea    dx. at one time, then they said no- mild, no cpap   Transient ischemic attack (TIA)     Past Surgical History:  Procedure Laterality Date   ACHILLES TENDON SURGERY Right 1994   BARIATRIC SURGERY     2005   cataracts Bilateral 2008   CHOLECYSTECTOMY     COLONOSCOPY  2015   Outside facility:  medium-sized lipoma in transverse colon.    EXTRACORPOREAL SHOCK WAVE LITHOTRIPSY Left 06/13/2018   Procedure: LEFT EXTRACORPOREAL SHOCK WAVE LITHOTRIPSY (ESWL) WITH MAC;  Surgeon: Homero Luster, MD;  Location: [facility];  Service: Urology;  Laterality: Left;   repair of wrist fx Right    SPINAL CORD STIMULATOR INSERTION N/A 12/01/2020   Procedure: Lumbar spinal cord stimulator placement;  Surgeon: Annis Kinder, MD;  Location: [facility] OR;  Service: Neurosurgery;  Laterality: N/A;   TONSILLECTOMY      MEDICATIONS:  albuterol (VENTOLIN HFA) 108 (90 Base) MCG/ACT inhaler   ALPRAZolam (XANAX) 1 MG tablet   aspirin 81 MG EC tablet   atorvastatin (LIPITOR) 10 MG tablet   cetirizine (ZYRTEC) 10 MG tablet   citalopram (CELEXA) 40 MG tablet   diclofenac Sodium (  VOLTAREN) 1 % GEL   levETIRAcetam  (KEPPRA  XR) 500 MG 24 hr tablet   loratadine (CLARITIN) 10 MG tablet   montelukast (SINGULAIR) 10 MG tablet   oxyCODONE-acetaminophen  (PERCOCET) 10-325 MG tablet   Polyethyl Glycol-Propyl Glycol (SYSTANE) 0.4-0.3 % SOLN   tamsulosin (FLOMAX) 0.4 MG CAPS capsule   tirzepatide  (MOUNJARO ) 12.5 MG/0.5ML Pen   tirzepatide  (MOUNJARO ) 15 MG/0.5ML Pen   triamcinolone  cream (KENALOG) 0.5 %   Vibegron (GEMTESA) 75 MG TABS   No current facility-administered medications for this encounter.     Chick Cotton Ward, PA-C [facility] Pre-Surgical Testing [phone]

## 2024-03-03 ENCOUNTER — Encounter (HOSPITAL_COMMUNITY)
Admission: RE | Disposition: A | Discharge status: Home / Self Care | Payer: Self-pay | Source: Home / Self Care | Attending: Orthopedic Surgery

## 2024-03-03 ENCOUNTER — Observation Stay (HOSPITAL_COMMUNITY)
Admission: RE | Admit: 2024-03-03 | Discharge: 2024-03-06 | Disposition: A | Discharge status: Home / Self Care | Attending: Orthopedic Surgery | Admitting: Orthopedic Surgery

## 2024-03-03 ENCOUNTER — Other Ambulatory Visit: Payer: Self-pay

## 2024-03-03 ENCOUNTER — Ambulatory Visit (HOSPITAL_COMMUNITY): Admitting: Anesthesiology

## 2024-03-03 ENCOUNTER — Observation Stay (HOSPITAL_COMMUNITY)

## 2024-03-03 ENCOUNTER — Ambulatory Visit (HOSPITAL_COMMUNITY): Payer: Self-pay | Admitting: Physician Assistant

## 2024-03-03 ENCOUNTER — Encounter (HOSPITAL_COMMUNITY): Payer: Self-pay | Admitting: Orthopedic Surgery

## 2024-03-03 DIAGNOSIS — Z91040 Latex allergy status: Secondary | ICD-10-CM | POA: Insufficient documentation

## 2024-03-03 DIAGNOSIS — Z96652 Presence of left artificial knee joint: Secondary | ICD-10-CM | POA: Diagnosis not present

## 2024-03-03 DIAGNOSIS — Z79899 Other long term (current) drug therapy: Secondary | ICD-10-CM | POA: Diagnosis not present

## 2024-03-03 DIAGNOSIS — Z8673 Personal history of transient ischemic attack (TIA), and cerebral infarction without residual deficits: Secondary | ICD-10-CM | POA: Diagnosis not present

## 2024-03-03 DIAGNOSIS — M1712 Unilateral primary osteoarthritis, left knee: Principal | ICD-10-CM | POA: Diagnosis present

## 2024-03-03 DIAGNOSIS — Z7982 Long term (current) use of aspirin: Secondary | ICD-10-CM | POA: Insufficient documentation

## 2024-03-03 DIAGNOSIS — Z471 Aftercare following joint replacement surgery: Secondary | ICD-10-CM | POA: Diagnosis not present

## 2024-03-03 DIAGNOSIS — I10 Essential (primary) hypertension: Secondary | ICD-10-CM | POA: Insufficient documentation

## 2024-03-03 DIAGNOSIS — E119 Type 2 diabetes mellitus without complications: Secondary | ICD-10-CM | POA: Diagnosis not present

## 2024-03-03 DIAGNOSIS — R609 Edema, unspecified: Secondary | ICD-10-CM | POA: Diagnosis not present

## 2024-03-03 DIAGNOSIS — G8918 Other acute postprocedural pain: Secondary | ICD-10-CM | POA: Diagnosis not present

## 2024-03-03 HISTORY — PX: TOTAL KNEE ARTHROPLASTY: SHX125

## 2024-03-03 LAB — GLUCOSE, CAPILLARY
Glucose-Capillary: 95 mg/dL (ref 70–99)
Glucose-Capillary: 115 mg/dL — ABNORMAL HIGH (ref 70–99)

## 2024-03-03 LAB — TYPE AND SCREEN
ABO/RH(D): A POS
Antibody Screen: NEGATIVE

## 2024-03-03 LAB — ABO/RH: ABO/RH(D): A POS

## 2024-03-03 SURGERY — ARTHROPLASTY, KNEE, TOTAL
Anesthesia: Spinal | Site: Knee | Laterality: Left

## 2024-03-03 MED ORDER — CEFAZOLIN SODIUM-DEXTROSE 2-4 GM/100ML-% IV SOLN
2.0000 g | Freq: Four times a day (QID) | INTRAVENOUS | Status: AC
  Administered 2024-03-03 – 2024-03-04 (×2): 2 g via INTRAVENOUS
  Filled 2024-03-03 (×2): qty 100

## 2024-03-03 MED ORDER — FENTANYL CITRATE (PF) 100 MCG/2ML IJ SOLN
INTRAMUSCULAR | Status: DC | PRN
  Administered 2024-03-03 (×4): 50 ug via INTRAVENOUS

## 2024-03-03 MED ORDER — OXYCODONE HCL 5 MG PO TABS: 5.0000 mg | ORAL_TABLET | ORAL | 0 refills | Status: AC | PRN

## 2024-03-03 MED ORDER — VANCOMYCIN HCL 2000 MG/400ML IV SOLN
2000.0000 mg | Freq: Once | INTRAVENOUS | Status: AC
  Administered 2024-03-03 (×2): 1000 mg via INTRAVENOUS
  Filled 2024-03-03: qty 400

## 2024-03-03 MED ORDER — LORATADINE 10 MG PO TABS
10.0000 mg | ORAL_TABLET | Freq: Every day | ORAL | Status: DC
  Administered 2024-03-04 – 2024-03-06 (×3): 10 mg via ORAL
  Filled 2024-03-03 (×3): qty 1

## 2024-03-03 MED ORDER — ACETAMINOPHEN 500 MG PO TABS
1000.0000 mg | ORAL_TABLET | Freq: Once | ORAL | Status: AC
  Administered 2024-03-03: 1000 mg via ORAL
  Filled 2024-03-03: qty 2

## 2024-03-03 MED ORDER — ESMOLOL HCL 100 MG/10ML IV SOLN
INTRAVENOUS | Status: AC
  Filled 2024-03-03: qty 10

## 2024-03-03 MED ORDER — OXYCODONE HCL 5 MG PO TABS
5.0000 mg | ORAL_TABLET | ORAL | Status: DC | PRN
  Administered 2024-03-03 – 2024-03-04 (×3): 10 mg via ORAL
  Administered 2024-03-05: 5 mg via ORAL
  Administered 2024-03-05 – 2024-03-06 (×6): 10 mg via ORAL
  Filled 2024-03-03 (×4): qty 2
  Filled 2024-03-03: qty 1
  Filled 2024-03-03 (×6): qty 2
  Filled 2024-03-03: qty 1

## 2024-03-03 MED ORDER — ESMOLOL HCL 100 MG/10ML IV SOLN
INTRAVENOUS | Status: DC | PRN
Start: 2024-03-03 — End: 2024-03-03
  Administered 2024-03-03: 10 mg via INTRAVENOUS

## 2024-03-03 MED ORDER — ISOPROPYL ALCOHOL 70 % SOLN
Status: DC | PRN
  Administered 2024-03-03: 1 via TOPICAL

## 2024-03-03 MED ORDER — METOPROLOL TARTRATE 5 MG/5ML IV SOLN
INTRAVENOUS | Status: AC
  Filled 2024-03-03: qty 5

## 2024-03-03 MED ORDER — ACETAMINOPHEN 325 MG PO TABS: 325.0000 mg | ORAL_TABLET | Freq: Four times a day (QID) | ORAL | Status: DC | PRN

## 2024-03-03 MED ORDER — SODIUM CHLORIDE 0.9 % IR SOLN
Status: DC | PRN
Start: 2024-03-03 — End: 2024-03-03
  Administered 2024-03-03: 3000 mL

## 2024-03-03 MED ORDER — OMEPRAZOLE 40 MG PO CPDR: 40.0000 mg | DELAYED_RELEASE_CAPSULE | Freq: Every day | ORAL | 0 refills | Status: DC

## 2024-03-03 MED ORDER — INSULIN ASPART 100 UNIT/ML IJ SOLN: 0.0000 [IU] | INTRAMUSCULAR | Status: DC | PRN

## 2024-03-03 MED ORDER — SODIUM CHLORIDE (PF) 0.9 % IJ SOLN
INTRAMUSCULAR | Status: DC | PRN
  Administered 2024-03-03: 80 mL

## 2024-03-03 MED ORDER — ORAL CARE MOUTH RINSE: 15.0000 mL | Freq: Once | OROMUCOSAL | Status: AC

## 2024-03-03 MED ORDER — PROPOFOL 10 MG/ML IV BOLUS
INTRAVENOUS | Status: AC
  Filled 2024-03-03: qty 20

## 2024-03-03 MED ORDER — ALBUTEROL SULFATE (2.5 MG/3ML) 0.083% IN NEBU: 2.5000 mg | INHALATION_SOLUTION | Freq: Four times a day (QID) | RESPIRATORY_TRACT | Status: DC | PRN

## 2024-03-03 MED ORDER — HYDROMORPHONE HCL 1 MG/ML IJ SOLN
0.5000 mg | INTRAMUSCULAR | Status: DC | PRN
  Administered 2024-03-04 – 2024-03-05 (×5): 1 mg via INTRAVENOUS
  Filled 2024-03-03 (×5): qty 1

## 2024-03-03 MED ORDER — DEXAMETHASONE SODIUM PHOSPHATE 10 MG/ML IJ SOLN: 4.0000 mg | Freq: Once | INTRAMUSCULAR | Status: DC

## 2024-03-03 MED ORDER — POLYETHYLENE GLYCOL 3350 17 G PO PACK: 17.0000 g | PACK | Freq: Every day | ORAL | Status: DC | PRN

## 2024-03-03 MED ORDER — ACETAMINOPHEN 500 MG PO TABS: 1000.0000 mg | ORAL_TABLET | Freq: Three times a day (TID) | ORAL | Status: AC | PRN

## 2024-03-03 MED ORDER — MIDAZOLAM HCL 2 MG/2ML IJ SOLN
1.0000 mg | INTRAMUSCULAR | Status: AC
  Administered 2024-03-03: 2 mg via INTRAVENOUS
  Filled 2024-03-03: qty 2

## 2024-03-03 MED ORDER — ONDANSETRON HCL 4 MG/2ML IJ SOLN
INTRAMUSCULAR | Status: AC
  Filled 2024-03-03: qty 2

## 2024-03-03 MED ORDER — PHENYLEPHRINE HCL-NACL 20-0.9 MG/250ML-% IV SOLN
INTRAVENOUS | Status: AC
  Filled 2024-03-03: qty 250

## 2024-03-03 MED ORDER — CEFAZOLIN SODIUM-DEXTROSE 3-4 GM/150ML-% IV SOLN
3.0000 g | Freq: Once | INTRAVENOUS | Status: AC
  Administered 2024-03-03: 3 g via INTRAVENOUS

## 2024-03-03 MED ORDER — OXYCODONE HCL 5 MG/5ML PO SOLN: 5.0000 mg | Freq: Once | ORAL | Status: DC | PRN

## 2024-03-03 MED ORDER — POLYETHYLENE GLYCOL 3350 17 G PO PACK: 17.0000 g | PACK | Freq: Every day | ORAL | 0 refills | Status: DC

## 2024-03-03 MED ORDER — BUPIVACAINE LIPOSOME 1.3 % IJ SUSP
INTRAMUSCULAR | Status: AC
Start: 2024-03-03 — End: ?
  Filled 2024-03-03: qty 20

## 2024-03-03 MED ORDER — FENTANYL CITRATE PF 50 MCG/ML IJ SOSY
PREFILLED_SYRINGE | INTRAMUSCULAR | Status: AC
  Filled 2024-03-03: qty 1

## 2024-03-03 MED ORDER — DIPHENHYDRAMINE HCL 12.5 MG/5ML PO ELIX: 12.5000 mg | ORAL_SOLUTION | ORAL | Status: DC | PRN

## 2024-03-03 MED ORDER — ACETAMINOPHEN 10 MG/ML IV SOLN: 1000.0000 mg | Freq: Once | INTRAVENOUS | Status: DC | PRN

## 2024-03-03 MED ORDER — MIRABEGRON ER 25 MG PO TB24
25.0000 mg | ORAL_TABLET | Freq: Every day | ORAL | Status: DC
  Administered 2024-03-04 – 2024-03-06 (×3): 25 mg via ORAL
  Filled 2024-03-03 (×3): qty 1

## 2024-03-03 MED ORDER — VANCOMYCIN HCL IN DEXTROSE 1-5 GM/200ML-% IV SOLN
1000.0000 mg | Freq: Once | INTRAVENOUS | Status: AC
  Administered 2024-03-04: 1000 mg via INTRAVENOUS
  Filled 2024-03-03: qty 200

## 2024-03-03 MED ORDER — ONDANSETRON HCL 4 MG PO TABS: 4.0000 mg | ORAL_TABLET | Freq: Three times a day (TID) | ORAL | 0 refills | Status: AC | PRN

## 2024-03-03 MED ORDER — BUPIVACAINE LIPOSOME 1.3 % IJ SUSP: 20.0000 mL | Freq: Once | INTRAMUSCULAR | Status: DC

## 2024-03-03 MED ORDER — ALPRAZOLAM 0.5 MG PO TABS: 1.0000 mg | ORAL_TABLET | Freq: Three times a day (TID) | ORAL | Status: DC | PRN

## 2024-03-03 MED ORDER — PROPOFOL 1000 MG/100ML IV EMUL
INTRAVENOUS | Status: AC
  Filled 2024-03-03: qty 100

## 2024-03-03 MED ORDER — KETOROLAC TROMETHAMINE 15 MG/ML IJ SOLN
7.5000 mg | Freq: Four times a day (QID) | INTRAMUSCULAR | Status: AC
  Administered 2024-03-03 – 2024-03-04 (×3): 7.5 mg via INTRAVENOUS
  Filled 2024-03-03 (×3): qty 1

## 2024-03-03 MED ORDER — LEVETIRACETAM ER 500 MG PO TB24
1000.0000 mg | ORAL_TABLET | Freq: Every evening | ORAL | Status: DC
  Administered 2024-03-03 – 2024-03-05 (×3): 1000 mg via ORAL
  Filled 2024-03-03 (×4): qty 2

## 2024-03-03 MED ORDER — LIDOCAINE HCL (PF) 2 % IJ SOLN
INTRAMUSCULAR | Status: AC
  Filled 2024-03-03: qty 5

## 2024-03-03 MED ORDER — PROPOFOL 10 MG/ML IV BOLUS
INTRAVENOUS | Status: DC | PRN
  Administered 2024-03-03: 200 mg via INTRAVENOUS
  Administered 2024-03-03: 150 ug/kg/min via INTRAVENOUS

## 2024-03-03 MED ORDER — MONTELUKAST SODIUM 10 MG PO TABS
10.0000 mg | ORAL_TABLET | Freq: Every day | ORAL | Status: DC
  Administered 2024-03-03 – 2024-03-05 (×3): 10 mg via ORAL
  Filled 2024-03-03 (×3): qty 1

## 2024-03-03 MED ORDER — OXYCODONE HCL 5 MG PO TABS: 5.0000 mg | ORAL_TABLET | Freq: Once | ORAL | Status: DC | PRN

## 2024-03-03 MED ORDER — CLONIDINE HCL (ANALGESIA) 100 MCG/ML EP SOLN
EPIDURAL | Status: DC | PRN
  Administered 2024-03-03: 50 ug

## 2024-03-03 MED ORDER — NAPHAZOLINE-GLYCERIN 0.012-0.25 % OP SOLN: 1.0000 [drp] | Freq: Every day | OPHTHALMIC | Status: DC | PRN

## 2024-03-03 MED ORDER — FENTANYL CITRATE PF 50 MCG/ML IJ SOSY
50.0000 ug | PREFILLED_SYRINGE | INTRAMUSCULAR | Status: AC
  Administered 2024-03-03: 50 ug via INTRAVENOUS
  Filled 2024-03-03: qty 2

## 2024-03-03 MED ORDER — METHOCARBAMOL 1000 MG/10ML IJ SOLN: 500.0000 mg | Freq: Four times a day (QID) | INTRAMUSCULAR | Status: DC | PRN

## 2024-03-03 MED ORDER — ACETAMINOPHEN 500 MG PO TABS
1000.0000 mg | ORAL_TABLET | Freq: Four times a day (QID) | ORAL | Status: AC
  Administered 2024-03-03 – 2024-03-04 (×3): 1000 mg via ORAL
  Filled 2024-03-03 (×3): qty 2

## 2024-03-03 MED ORDER — FENTANYL CITRATE PF 50 MCG/ML IJ SOSY
25.0000 ug | PREFILLED_SYRINGE | INTRAMUSCULAR | Status: DC | PRN
  Administered 2024-03-03 (×2): 50 ug via INTRAVENOUS

## 2024-03-03 MED ORDER — LACTATED RINGERS IV SOLN: INTRAVENOUS | Status: DC

## 2024-03-03 MED ORDER — TAMSULOSIN HCL 0.4 MG PO CAPS
0.4000 mg | ORAL_CAPSULE | Freq: Two times a day (BID) | ORAL | Status: DC
  Administered 2024-03-03 – 2024-03-06 (×6): 0.4 mg via ORAL
  Filled 2024-03-03 (×6): qty 1

## 2024-03-03 MED ORDER — BUPIVACAINE-EPINEPHRINE (PF) 0.25% -1:200000 IJ SOLN
INTRAMUSCULAR | Status: AC
  Filled 2024-03-03: qty 30

## 2024-03-03 MED ORDER — CELECOXIB 100 MG PO CAPS: 100.0000 mg | ORAL_CAPSULE | Freq: Two times a day (BID) | ORAL | 0 refills | Status: AC

## 2024-03-03 MED ORDER — ONDANSETRON HCL 4 MG PO TABS: 4.0000 mg | ORAL_TABLET | Freq: Four times a day (QID) | ORAL | Status: DC | PRN

## 2024-03-03 MED ORDER — ZOLPIDEM TARTRATE 5 MG PO TABS
5.0000 mg | ORAL_TABLET | Freq: Every evening | ORAL | Status: DC | PRN
  Administered 2024-03-04: 5 mg via ORAL
  Filled 2024-03-03: qty 1

## 2024-03-03 MED ORDER — TRANEXAMIC ACID-NACL 1000-0.7 MG/100ML-% IV SOLN
1000.0000 mg | INTRAVENOUS | Status: AC
  Administered 2024-03-03: 1000 mg via INTRAVENOUS
  Filled 2024-03-03: qty 100

## 2024-03-03 MED ORDER — CHLORHEXIDINE GLUCONATE 0.12 % MT SOLN
15.0000 mL | Freq: Once | OROMUCOSAL | Status: AC
  Administered 2024-03-03: 15 mL via OROMUCOSAL

## 2024-03-03 MED ORDER — METHOCARBAMOL 500 MG PO TABS: 500.0000 mg | ORAL_TABLET | Freq: Three times a day (TID) | ORAL | 0 refills | Status: AC | PRN

## 2024-03-03 MED ORDER — SODIUM CHLORIDE (PF) 0.9 % IJ SOLN
INTRAMUSCULAR | Status: AC
  Filled 2024-03-03: qty 30

## 2024-03-03 MED ORDER — ATORVASTATIN CALCIUM 10 MG PO TABS
10.0000 mg | ORAL_TABLET | Freq: Every evening | ORAL | Status: DC
  Administered 2024-03-03 – 2024-03-05 (×3): 10 mg via ORAL
  Filled 2024-03-03 (×3): qty 1

## 2024-03-03 MED ORDER — DOCUSATE SODIUM 100 MG PO CAPS
100.0000 mg | ORAL_CAPSULE | Freq: Two times a day (BID) | ORAL | Status: DC
  Administered 2024-03-03 – 2024-03-06 (×6): 100 mg via ORAL
  Filled 2024-03-03 (×6): qty 1

## 2024-03-03 MED ORDER — CITALOPRAM HYDROBROMIDE 20 MG PO TABS
40.0000 mg | ORAL_TABLET | Freq: Every evening | ORAL | Status: DC
  Administered 2024-03-03 – 2024-03-05 (×3): 40 mg via ORAL
  Filled 2024-03-03 (×4): qty 2

## 2024-03-03 MED ORDER — FENTANYL CITRATE (PF) 100 MCG/2ML IJ SOLN
INTRAMUSCULAR | Status: AC
  Filled 2024-03-03: qty 2

## 2024-03-03 MED ORDER — PANTOPRAZOLE SODIUM 40 MG PO TBEC
40.0000 mg | DELAYED_RELEASE_TABLET | Freq: Every day | ORAL | Status: DC
  Administered 2024-03-03 – 2024-03-06 (×4): 40 mg via ORAL
  Filled 2024-03-03 (×4): qty 1

## 2024-03-03 MED ORDER — SODIUM CHLORIDE 0.9 % IV SOLN: INTRAVENOUS | Status: DC

## 2024-03-03 MED ORDER — POVIDONE-IODINE 10 % EX SWAB: 2.0000 | Freq: Once | CUTANEOUS | Status: DC

## 2024-03-03 MED ORDER — 0.9 % SODIUM CHLORIDE (POUR BTL) OPTIME
TOPICAL | Status: DC | PRN
Start: 2024-03-03 — End: 2024-03-03
  Administered 2024-03-03: 1000 mL

## 2024-03-03 MED ORDER — METHOCARBAMOL 500 MG PO TABS
500.0000 mg | ORAL_TABLET | Freq: Four times a day (QID) | ORAL | Status: DC | PRN
  Administered 2024-03-04 – 2024-03-06 (×5): 500 mg via ORAL
  Filled 2024-03-03 (×5): qty 1

## 2024-03-03 MED ORDER — ASPIRIN 81 MG PO TBEC
81.0000 mg | DELAYED_RELEASE_TABLET | Freq: Two times a day (BID) | ORAL | Status: DC
Start: 2024-03-04 — End: 2024-04-01

## 2024-03-03 MED ORDER — METOPROLOL TARTRATE 5 MG/5ML IV SOLN
INTRAVENOUS | Status: DC | PRN
  Administered 2024-03-03 (×2): 2 mg via INTRAVENOUS

## 2024-03-03 MED ORDER — INSULIN ASPART 100 UNIT/ML IJ SOLN: 0.0000 [IU] | Freq: Three times a day (TID) | INTRAMUSCULAR | Status: DC

## 2024-03-03 MED ORDER — ASPIRIN 81 MG PO CHEW
81.0000 mg | CHEWABLE_TABLET | Freq: Two times a day (BID) | ORAL | Status: DC
Start: 2024-03-03 — End: 2024-03-05
  Administered 2024-03-03 – 2024-03-05 (×4): 81 mg via ORAL
  Filled 2024-03-03 (×4): qty 1

## 2024-03-03 MED ORDER — MENTHOL 3 MG MT LOZG: 1.0000 | LOZENGE | OROMUCOSAL | Status: DC | PRN

## 2024-03-03 MED ORDER — LIDOCAINE HCL (CARDIAC) PF 100 MG/5ML IV SOSY
PREFILLED_SYRINGE | INTRAVENOUS | Status: DC | PRN
  Administered 2024-03-03: 100 mg via INTRAVENOUS

## 2024-03-03 MED ORDER — CEFAZOLIN SODIUM-DEXTROSE 2-4 GM/100ML-% IV SOLN
2.0000 g | INTRAVENOUS | Status: DC
  Filled 2024-03-03: qty 100

## 2024-03-03 MED ORDER — ONDANSETRON HCL 4 MG/2ML IJ SOLN: 4.0000 mg | Freq: Four times a day (QID) | INTRAMUSCULAR | Status: DC | PRN

## 2024-03-03 MED ORDER — WATER FOR IRRIGATION, STERILE IR SOLN
Status: DC | PRN
  Administered 2024-03-03: 2000 mL

## 2024-03-03 MED ORDER — PHENOL 1.4 % MT LIQD
1.0000 | OROMUCOSAL | Status: DC | PRN
Start: 2024-03-03 — End: 2024-03-06

## 2024-03-03 MED ORDER — ROPIVACAINE HCL 5 MG/ML IJ SOLN
INTRAMUSCULAR | Status: DC | PRN
  Administered 2024-03-03: 20 mL via PERINEURAL

## 2024-03-03 MED ORDER — ONDANSETRON HCL 4 MG/2ML IJ SOLN
INTRAMUSCULAR | Status: DC | PRN
  Administered 2024-03-03: 4 mg via INTRAVENOUS

## 2024-03-03 SURGICAL SUPPLY — 52 items
BAG COUNTER SPONGE SURGICOUNT (BAG) IMPLANT
BLADE SAG 18X100X1.27 (BLADE) ×1 IMPLANT
BLADE SAW SAG 35X64 .89 (BLADE) ×1 IMPLANT
BLADE SAW SGTL 11.0X1.19X90.0M (BLADE) IMPLANT
BNDG COHESIVE 3X5 TAN ST LF (GAUZE/BANDAGES/DRESSINGS) ×1 IMPLANT
BNDG ELASTIC 6X10 VLCR STRL LF (GAUZE/BANDAGES/DRESSINGS) ×1 IMPLANT
BOWL SMART MIX CTS (DISPOSABLE) IMPLANT
CEMENT BONE R 1X40 (Cement) IMPLANT
CEMENT BONE REFOBACIN R1X40 US (Cement) IMPLANT
CHLORAPREP W/TINT 26 (MISCELLANEOUS) ×2 IMPLANT
COMPONENT FEM PS KN STD 10 LT (Knees) IMPLANT
COMPONENT PATELLA 3 PEG 35 (Joint) IMPLANT
COMPONET TIB PS G 0D LT (Joint) IMPLANT
COVER SURGICAL LIGHT HANDLE (MISCELLANEOUS) ×1 IMPLANT
CUFF TRNQT CYL 34X4.125X (TOURNIQUET CUFF) ×1 IMPLANT
DERMABOND ADVANCED .7 DNX12 (GAUZE/BANDAGES/DRESSINGS) ×1 IMPLANT
DRAPE INCISE IOBAN 85X60 (DRAPES) ×1 IMPLANT
DRAPE SHEET LG 3/4 BI-LAMINATE (DRAPES) ×1 IMPLANT
DRAPE U-SHAPE 47X51 STRL (DRAPES) ×1 IMPLANT
DRSG AQUACEL AG ADV 3.5X10 (GAUZE/BANDAGES/DRESSINGS) ×1 IMPLANT
ELECT REM PT RETURN 15FT ADLT (MISCELLANEOUS) ×1 IMPLANT
GAUZE SPONGE 4X4 12PLY STRL (GAUZE/BANDAGES/DRESSINGS) ×1 IMPLANT
GLOVE BIO SURGEON STRL SZ 6.5 (GLOVE) ×2 IMPLANT
GLOVE BIOGEL PI IND STRL 6.5 (GLOVE) ×1 IMPLANT
GLOVE BIOGEL PI IND STRL 8 (GLOVE) ×1 IMPLANT
GLOVE SURG ORTHO 8.0 STRL STRW (GLOVE) ×2 IMPLANT
GOWN STRL REUS W/ TWL XL LVL3 (GOWN DISPOSABLE) ×2 IMPLANT
HOLDER FOLEY CATH W/STRAP (MISCELLANEOUS) ×1 IMPLANT
HOOD PEEL AWAY T7 (MISCELLANEOUS) ×3 IMPLANT
INSERT COMP PS 8-11 GH LT (Insert) IMPLANT
KIT TURNOVER KIT A (KITS) ×1 IMPLANT
MANIFOLD NEPTUNE II (INSTRUMENTS) ×1 IMPLANT
MARKER SKIN DUAL TIP RULER LAB (MISCELLANEOUS) ×1 IMPLANT
NS IRRIG 1000ML POUR BTL (IV SOLUTION) ×1 IMPLANT
PACK TOTAL KNEE CUSTOM (KITS) ×1 IMPLANT
PENCIL SMOKE EVACUATOR (MISCELLANEOUS) ×1 IMPLANT
PIN DRILL HDLS TROCAR 75 4PK (PIN) IMPLANT
SCREW HEADED 33MM KNEE (MISCELLANEOUS) IMPLANT
SET HNDPC FAN SPRY TIP SCT (DISPOSABLE) ×1 IMPLANT
SOLUTION IRRIG SURGIPHOR (IV SOLUTION) IMPLANT
SOLUTION PRONTOSAN WOUND 350ML (IRRIGATION / IRRIGATOR) IMPLANT
STRIP CLOSURE SKIN 1/2X4 (GAUZE/BANDAGES/DRESSINGS) ×1 IMPLANT
SUT MNCRL AB 3-0 PS2 18 (SUTURE) ×1 IMPLANT
SUT STRATAFIX 14 PDO 48 VLT (SUTURE) ×1 IMPLANT
SUT STRATAFIX PDS+ 0 24IN (SUTURE) IMPLANT
SUT VIC AB 2-0 CT2 27 (SUTURE) ×2 IMPLANT
SUTURE STRATFX 0 PDS 27 VIOLET (SUTURE) ×1 IMPLANT
SYR 50ML LL SCALE MARK (SYRINGE) ×1 IMPLANT
TRAY FOLEY MTR SLVR 14FR STAT (SET/KITS/TRAYS/PACK) IMPLANT
TUBE SUCTION HIGH CAP CLEAR NV (SUCTIONS) ×1 IMPLANT
UNDERPAD 30X36 HEAVY ABSORB (UNDERPADS AND DIAPERS) ×1 IMPLANT
WRAP KNEE MAXI GEL POST OP (GAUZE/BANDAGES/DRESSINGS) ×1 IMPLANT

## 2024-03-03 NOTE — Transfer of Care (Signed)
 Immediate Anesthesia Transfer of Care Note  Patient: Seth [name]  Procedure(s) Performed: ARTHROPLASTY, KNEE, TOTAL (Left: Knee)  Patient Location: PACU  Anesthesia Type:General  Level of Consciousness: awake, oriented, drowsy, and patient cooperative  Airway & Oxygen Therapy: Patient Spontanous Breathing and Patient connected to face mask oxygen  Post-op Assessment: Report given to RN and Post -op Vital signs reviewed and stable  Post vital signs: Reviewed and stable  Last Vitals:  Vitals Value Taken Time  BP 148/90 03/03/24 1501  Temp    Pulse 46 03/03/24 1504  Resp 13 03/03/24 1504  SpO2 100 % 03/03/24 1504  Vitals shown include unfiled device data.  Last Pain:  Vitals:   03/03/24 1151  TempSrc:   PainSc: 0-No pain         Complications: No notable events documented.

## 2024-03-03 NOTE — Plan of Care (Signed)
  Problem: Pain Managment: Goal: General experience of comfort will improve and/or be controlled Outcome: Progressing   Problem: Safety: Goal: Ability to remain free from injury will improve Outcome: Progressing   Problem: Skin Integrity: Goal: Risk for impaired skin integrity will decrease Outcome: Progressing

## 2024-03-03 NOTE — Anesthesia Procedure Notes (Signed)
 Anesthesia Regional Block: Adductor canal block   Pre-Anesthetic Checklist: , timeout performed,  Correct Patient, Correct Site, Correct Laterality,  Correct Procedure, Correct Position, site marked,  Risks and benefits discussed,  Surgical consent,  Pre-op evaluation,  At surgeon's request and post-op pain management  Laterality: Left  Prep: chloraprep       Needles:  Injection technique: Single-shot  Needle Type: Echogenic Needle     Needle Length: 9cm  Needle Gauge: 21     Additional Needles:   Procedures:,,,, ultrasound used (permanent image in chart),,    Narrative:  Start time: 03/03/2024 12:28 PM End time: 03/03/2024 12:32 PM Injection made incrementally with aspirations every 5 mL.  Performed by: Personally  Anesthesiologist: Peggy Bowens, MD

## 2024-03-03 NOTE — Progress Notes (Signed)
 Pharmacy: vancomycin  for surgical prophykaxis  Per Dr. Pryor Browning via [place] msg, he would like pharmacy to order one dose of vancomycin  post-op for surgical prophylaxis.  - pt received vancomycin  2gm x1 pre-op at 2pm on 6/2  Plan: - will give vancomycin  1gm x1 at 0200 on 6/3 - pharmacy will sign off  Sharlyn Deaner, PharmD, BCPS 03/03/2024 7:33 PM

## 2024-03-03 NOTE — Interval H&P Note (Signed)
The patient has been re-examined, and the chart reviewed, and there have been no interval changes to the documented history and physical.    Plan for L TKA for L knee OA  The operative side was examined and the patient was confirmed to have sensation to DPN, SPN, TN intact, Motor EHL, ext, flex 5/5, and DP 2+, PT 2+, No significant edema.   The risks, benefits, and alternatives have been discussed at length with patient, and the patient is willing to proceed.  Left knee marked. Consent has been signed.  

## 2024-03-03 NOTE — Anesthesia Preprocedure Evaluation (Signed)
 Anesthesia Evaluation  Patient identified by MRN, date of birth, ID band Patient awake    Reviewed: Allergy & Precautions, NPO status , Patient's Chart, lab work & pertinent test results  Airway Mallampati: II  TM Distance: >3 FB Neck ROM: Full    Dental  (+) Dental Advisory Given   Pulmonary sleep apnea    breath sounds clear to auscultation       Cardiovascular hypertension, Pt. on medications  Rhythm:Regular Rate:Normal     Neuro/Psych TIA   GI/Hepatic negative GI ROS, Neg liver ROS,,,  Endo/Other  diabetes, Type 2    Renal/GU negative Renal ROS     Musculoskeletal  (+) Arthritis ,    Abdominal   Peds  Hematology negative hematology ROS (+)   Anesthesia Other Findings   Reproductive/Obstetrics                             Anesthesia Physical Anesthesia Plan  ASA: 2  Anesthesia Plan: Spinal   Post-op Pain Management: Regional block* and Tylenol  PO (pre-op)*   Induction:   PONV Risk Score and Plan: 1 and Propofol  infusion, Dexamethasone , Ondansetron  and Treatment may vary due to age or medical condition  Airway Management Planned: Natural Airway and Simple Face Mask  Additional Equipment:   Intra-op Plan:   Post-operative Plan:   Informed Consent: I have reviewed the patients History and Physical, chart, labs and discussed the procedure including the risks, benefits and alternatives for the proposed anesthesia with the patient or authorized representative who has indicated his/her understanding and acceptance.       Plan Discussed with: CRNA  Anesthesia Plan Comments:        Anesthesia Quick Evaluation

## 2024-03-03 NOTE — Op Note (Signed)
 DATE OF SURGERY:  03/03/2024 TIME: 2:33 PM  PATIENT NAME:  Seth [name]   AGE: 68 y.o.    PRE-OPERATIVE DIAGNOSIS: End-stage left knee osteoarthritis  POST-OPERATIVE DIAGNOSIS:  Same  PROCEDURE: Press-fit left total Knee Arthroplasty  SURGEON:  Josepha Barbier A Beverlyann Broxterman, MD   ASSISTANT: Mason Sole, PA-C, present and scrubbed throughout the case, critical for assistance with exposure, retraction, instrumentation, and closure.   OPERATIVE IMPLANTS:  [employer] persona CR left porous plasma spray press-fit standard femur, left size G Osseo Ti tibial baseplate, 11 mm MC polyethylene insert, 35 mm Osseo Ti 3 peg press-fit patella Implant Name Type Inv. Item Serial No. Manufacturer Lot No. LRB No. Used Action  COMPONET TIB PS G 0D LT - [id number] Joint COMPONET TIB PS G 0D LT  [employer](ORTH,TRAU,BIO,SG) [id number] Left 1 Implanted  COMPONENT FEM PS KN STD 10 LT - [id number] Knees COMPONENT FEM PS KN STD 10 LT  [employer](ORTH,TRAU,BIO,SG) [id number] Left 1 Implanted  COMPONENT PATELLA 3 PEG 35 - [id number] Joint COMPONENT PATELLA 3 PEG 35  [employer](ORTH,TRAU,BIO,SG) [id number] Left 1 Implanted  INSERT COMP PS 8-11 GH LT - [id number] Insert INSERT COMP PS 8-11 GH LT  [employer](ORTH,TRAU,BIO,SG) [id number] Left 1 Implanted      PREOPERATIVE INDICATIONS:  Seth [name] is a 68 y.o. year old male with end stage bone on bone degenerative arthritis of the knee who failed conservative treatment, including injections, antiinflammatories, activity modification, and assistive devices, and had significant impairment of their activities of daily living, and elected for Total Knee Arthroplasty.   The risks, benefits, and alternatives were discussed at length including but not limited to the risks of infection, bleeding, nerve injury, stiffness, blood clots, the need for revision surgery, cardiopulmonary complications, among others, and they were willing to proceed.  OPERATIVE FINDINGS AND  UNIQUE ASPECTS OF THE CASE: Patient was stable with anesthesia but found to be in atrial flutter, will reach out to cardiology postoperatively for further recommendations  ESTIMATED BLOOD LOSS: 50cc  OPERATIVE DESCRIPTION:   Once adequate anesthesia was induced, preoperative antibiotics, 3  gm of ancef, 2gm of vanc,1 gm of Tranexamic Acid, and 8 mg of Decadron  administered, the patient was positioned supine with a left thigh tourniquet placed.  The left lower extremity was prepped and draped in sterile fashion.  A time-  out was performed identifying the patient, planned procedure, and the appropriate extremity.     The leg was  exsanguinated, tourniquet elevated to .  A midline incision was  made followed by median parapatellar arthrotomy. Anterior horn of the medial meniscus was released and resected. A medial release was performed, the infrapatellar fat pad was resected with care taken to protect the patellar tendon. The suprapatellar fat was removed to exposed the distal anterior femur. The anterior horn of the lateral meniscus and ACL were released.    Following initial  exposure, I first started with the femur  The femoral  canal was opened with a drill, canal was suctioned to try to prevent fat emboli.  An  intramedullary rod was passed set at 5 degrees valgus, 10mm. The distal femur was resected.  Following this resection, the tibia was  subluxated anteriorly.  Using the extramedullary guide, 10mm of bone was resected off   the proximal lateral tibia.  We confirmed the gap would be  stable medially and laterally with a size 10mm spacer block as well as confirmed that the tibial cut was perpendicular in the coronal plane, checking  with an alignment rod.    Once this was done, the posterior femoral referencing femoral sizer was placed under to the posterior condyles with 3 degrees of external rotational which was parallel to the transepicondylar axis and perpendicular to [employer].  The femur was sized to be a size 10 in the anterior-  posterior dimension. The  anterior, posterior, and  chamfer cuts were made without difficulty nor   notching making certain that I was along the anterior cortex to help  with flexion gap stability. Next a laminar spreader was placed with the knee in flexion and the medial lateral menisci were resected.  5 cc of the Exparel  mixture was injected in the medial side of the back of the knee and 3 cc in the lateral side.  1/2 inch curved osteotome was used to resect posterior osteophyte that was then removed with a pituitary rongeur.       At this point, the tibia was sized to be a size G.  The size G tray was  then pinned in position. Trial reduction was now carried with a 10 femur, G tibia, a 11 mm MC insert.  The knee had full extension and was stable to varus valgus stress in extension.  The knee was tight in flexion and the PCL was released  Attention was next directed to the patella.  Precut  measurement was noted to be 28mm.  I resected down to 18 mm and used a  35mm patellar button to restore patellar height as well as cover the cut surface.     The patella lug holes were drilled and a 35mm patella poly trial was placed.    The knee was brought to full extension with good flexion stability with the patella tracking through the trochlea without application of pressure.    Next the femoral component was again assessed and determined to be seated and appropriately lateralized.  The femoral lug holes were drilled.  The femoral component was then removed. Tibial component was again assessed and felt to be seated and appropriately rotated with the medial third of the tubercle. The tibia was then drilled, and keel punched.     Final components were  opened and impacted into place.   The knee was irrigated with sterile Betadine diluted in saline as well as pulse lavage normal saline. The synovial lining was  then injected a dilute Exparel  with 30cc of  0.25% marcaine  with epinephrine.     I confirmed that I was satisfied with the range of motion and stability, and the final 11 mm MC poly insert was chosen.  It was placed into the knee.         The tourniquet had been let down at 55 minutes.  No significant hemostasis was required.  The medial parapatellar arthrotomy was then reapproximated using #1 Stratafix sutures with the knee  in flexion.  The remaining wound was closed with 0 stratafix, 2-0 Vicryl, and running 3-0 Monocryl. The knee was cleaned, dried, dressed sterilely using Dermabond and   Aquacel dressing.  The patient was then brought to recovery room in stable condition, tolerating the procedure  well. There were no complications.   Post op recs: WB: WBAT Abx: ancef + vanc Imaging: PACU xrays DVT prophylaxis: Eliquis 2.5 mg twice daily starting postop day 1 Follow up: 2 weeks after surgery for a wound check with Dr. Pryor Browning at [facility].  Address: [address], [place], [place] [address]  Office  Phone: [phone]  Priscille Brought, MD Orthopaedic Surgery

## 2024-03-03 NOTE — Anesthesia Procedure Notes (Signed)
 Procedure Name: LMA Insertion Date/Time: 03/03/2024 1:12 PM  Performed by: Carolynn Citrin, CRNAPre-anesthesia Checklist: Patient identified, Emergency Drugs available, Suction available, Patient being monitored and Timeout performed Patient Re-evaluated:Patient Re-evaluated prior to induction Oxygen Delivery Method: Circle system utilized Preoxygenation: Pre-oxygenation with 100% oxygen Induction Type: IV induction Ventilation: Mask ventilation without difficulty LMA: LMA inserted LMA Size: 5.0 Number of attempts: 1 Placement Confirmation: positive ETCO2 and breath sounds checked- equal and bilateral Tube secured with: Tape Dental Injury: Teeth and Oropharynx as per pre-operative assessment

## 2024-03-03 NOTE — Progress Notes (Addendum)
 Notified by anesthesiologist that patient had several runs of what appeared to be atrial flutter during the operation.  No recorded history of atrial flutter or atrial fibrillation via Epic record review.  Anesthesiologist recommended reaching out to cardiology for further recommendations.  Established with Dr. Mallipeddi of [facility].    1643: Spoke with Trish of Cardiology team.  Discussed patient case at length.  Patient is asymptomatic.   Cardiology team available to round on him tomorrow morning for further evaluation/management.    Mason Sole PA-C Orthopaedic Surgery  Address: [address], [place], [place] [address]  Office Phone: [phone]

## 2024-03-03 NOTE — Discharge Instructions (Signed)

## 2024-03-03 NOTE — Progress Notes (Signed)
 Orthopedic Tech Progress Note Patient Details:  Seth [name] [birth year] [id number] Applied bone foam per order.  Ortho Devices Type of Ortho Device: Bone foam zero knee Ortho Device/Splint Location: LLE Ortho Device/Splint Interventions: Ordered, Application, Adjustment   Post Interventions Patient Tolerated: Well Instructions Provided: Adjustment of device, Care of device  [name] 03/03/2024, 3:20 PM

## 2024-03-04 ENCOUNTER — Encounter (HOSPITAL_COMMUNITY): Payer: Self-pay | Admitting: Orthopedic Surgery

## 2024-03-04 DIAGNOSIS — I4891 Unspecified atrial fibrillation: Secondary | ICD-10-CM

## 2024-03-04 DIAGNOSIS — Z8673 Personal history of transient ischemic attack (TIA), and cerebral infarction without residual deficits: Secondary | ICD-10-CM | POA: Diagnosis not present

## 2024-03-04 DIAGNOSIS — M1712 Unilateral primary osteoarthritis, left knee: Secondary | ICD-10-CM | POA: Diagnosis not present

## 2024-03-04 DIAGNOSIS — E119 Type 2 diabetes mellitus without complications: Secondary | ICD-10-CM | POA: Diagnosis not present

## 2024-03-04 DIAGNOSIS — Z79899 Other long term (current) drug therapy: Secondary | ICD-10-CM | POA: Diagnosis not present

## 2024-03-04 DIAGNOSIS — Z91040 Latex allergy status: Secondary | ICD-10-CM | POA: Diagnosis not present

## 2024-03-04 DIAGNOSIS — I10 Essential (primary) hypertension: Secondary | ICD-10-CM | POA: Diagnosis not present

## 2024-03-04 DIAGNOSIS — Z7982 Long term (current) use of aspirin: Secondary | ICD-10-CM | POA: Diagnosis not present

## 2024-03-04 LAB — CBC
HCT: 42.8 % (ref 39.0–52.0)
Hemoglobin: 13.4 g/dL (ref 13.0–17.0)
MCH: 26.1 pg (ref 26.0–34.0)
MCHC: 31.3 g/dL (ref 30.0–36.0)
MCV: 83.3 fL (ref 80.0–100.0)
Platelets: 259 10*3/uL (ref 150–400)
RBC: 5.14 MIL/uL (ref 4.22–5.81)
RDW: 16.3 % — ABNORMAL HIGH (ref 11.5–15.5)
WBC: 7.3 10*3/uL (ref 4.0–10.5)
nRBC: 0 % (ref 0.0–0.2)

## 2024-03-04 LAB — BASIC METABOLIC PANEL WITH GFR
Anion gap: 4 — ABNORMAL LOW (ref 5–15)
BUN: 17 mg/dL (ref 8–23)
CO2: 25 mmol/L (ref 22–32)
Calcium: 8.4 mg/dL — ABNORMAL LOW (ref 8.9–10.3)
Chloride: 105 mmol/L (ref 98–111)
Creatinine, Ser: 0.93 mg/dL (ref 0.61–1.24)
GFR, Estimated: >60 mL/min (ref >60)
Glucose, Bld: 88 mg/dL (ref 70–99)
Potassium: 4.1 mmol/L (ref 3.5–5.1)
Sodium: 134 mmol/L — ABNORMAL LOW (ref 135–145)

## 2024-03-04 LAB — GLUCOSE, CAPILLARY
Glucose-Capillary: 85 mg/dL (ref 70–99)
Glucose-Capillary: 103 mg/dL — ABNORMAL HIGH (ref 70–99)
Glucose-Capillary: 98 mg/dL (ref 70–99)
Glucose-Capillary: 135 mg/dL — ABNORMAL HIGH (ref 70–99)

## 2024-03-04 MED ORDER — AMIODARONE HCL 200 MG PO TABS: 400.0000 mg | ORAL_TABLET | Freq: Two times a day (BID) | ORAL | Status: DC

## 2024-03-04 MED ORDER — AMIODARONE HCL 200 MG PO TABS
400.0000 mg | ORAL_TABLET | Freq: Two times a day (BID) | ORAL | Status: DC
  Administered 2024-03-04 – 2024-03-06 (×4): 400 mg via ORAL
  Filled 2024-03-04 (×4): qty 2

## 2024-03-04 MED ORDER — AMIODARONE HCL 200 MG PO TABS: 200.0000 mg | ORAL_TABLET | Freq: Every day | ORAL | Status: DC

## 2024-03-04 MED ORDER — AMIODARONE HCL 200 MG PO TABS: 200.0000 mg | ORAL_TABLET | Freq: Two times a day (BID) | ORAL | Status: DC

## 2024-03-04 MED ORDER — AMIODARONE HCL 200 MG PO TABS
400.0000 mg | ORAL_TABLET | Freq: Two times a day (BID) | ORAL | Status: DC
  Administered 2024-03-04: 400 mg via ORAL
  Filled 2024-03-04: qty 2

## 2024-03-04 MED ORDER — METOPROLOL TARTRATE 12.5 MG HALF TABLET
12.5000 mg | ORAL_TABLET | Freq: Once | ORAL | Status: AC
  Administered 2024-03-04: 12.5 mg via ORAL
  Filled 2024-03-04: qty 1

## 2024-03-04 NOTE — Anesthesia Postprocedure Evaluation (Signed)
 Anesthesia Post Note  Patient: Francie Irani  Procedure(s) Performed: ARTHROPLASTY, KNEE, TOTAL (Left: Knee)     Patient location during evaluation: PACU Anesthesia Type: General Level of consciousness: awake and alert Pain management: pain level controlled Vital Signs Assessment: post-procedure vital signs reviewed and stable Respiratory status: spontaneous breathing, nonlabored ventilation, respiratory function stable and patient connected to nasal cannula oxygen Cardiovascular status: blood pressure returned to baseline and stable Postop Assessment: no apparent nausea or vomiting Anesthetic complications: no   No notable events documented.  Last Vitals:  Vitals:   03/04/24 0048 03/04/24 0443  BP: 129/77 117/88  Pulse: 99 99  Resp: 18 18  Temp: 36.6 C 37.2 C  SpO2: 94% 97%    Last Pain:  Vitals:   03/04/24 0800  TempSrc:   PainSc: 0-No pain   Pain Goal: Patients Stated Pain Goal: 3 (03/04/24 0651)                 Melvenia Stabs

## 2024-03-04 NOTE — Consult Note (Addendum)
 Cardiology Consultation   [name] ID: Seth [name] MRN: [id number]; DOB: [birth year]  Admit date: 03/03/2024 Date of Consult: 03/04/2024  PCP:  Omie Bickers, MD   [facility] Providers Cardiologist:  Lasalle Pointer, MD   (would like to transfer care to [facility] office, Dr. Londa Rival)  [name] Profile: Seth [name] is a 67 y.o. male with a hx of OSA not on CPAP, orthostatic hypotension, hypertension, history of TIA, mood disorders, type 2 diabetes who is being seen 03/04/2024 for Seth evaluation of post-operative atrial fib/flutter with RVR at Seth request of Dr. Burnis Carver.  History of Present Illness: Seth [name] has past medical history as stated above.  Seth [name] presented to [facility] for scheduled left total knee arthroplasty on 03/03/2024.  [name] was doing well postop.  It was then noted that at some point overnight on 03/04/2024 Seth [name] went into atrial fibrillation/flutter with elevated heart rate.  Seth [name] has no known history of atrial fibrillation or flutter.  Seth [name] does have a history of OSA however is noncompliant with Seth CPAP.  Seth [name] was previously on medications for hypertension however Seth [name] states that Seth [name] has been off of them for some time and has no longer needed them.  Seth [name] says Seth [name] takes Seth [name] blood pressure at home 1-2 times daily and typically sees 120s/60-70s.  Seth [name] denies any symptoms, no chest pain, shortness of breath, palpitations.  Seth [name] was given Lopressor 12.5 mg x 1 last night during Seth occurrence.  Seth [name] plan was to be on Eliquis 2.5 mg twice daily for DVT prophylaxis from an orthopedic standpoint.  Will reach out to Seth orthopedic surgeon to discuss full dose Eliquis with new onset atrial fibrillation/flutter.  After speaking with Seth [name], Seth [name] agrees with Seth history stated above.  Seth [name] denies no history of known atrial fibrillation/flutter.  Seth [name] states that Seth [name] was asymptomatic and did not experience any chest pain, palpitations, shortness of breath.  Seth [name] was up  ambulating with therapy as I entered Seth room, Seth [name] had no issues with this.  After sitting for at least 10 minutes, Seth [name] heart rate remained elevated 110-120s.  Speaking with Seth [name], Seth [name] states that Seth [name] is not comfortable going home and would prefer to spend Seth night and continue evaluation here.   Past Medical History:  Diagnosis Date   Anxiety    Brain bleed [facility])    Intracerebral bleed - [employer] 2013   Bronchitis    Depression    Diabetes mellitus without complication (HCC)    History of kidney stones    Hypertension    Osteoarthritis of both knees    Pre-diabetes    Sleep apnea    dx. at one time, then they said no- mild, no cpap   Transient ischemic attack (TIA)    Past Surgical History:  Procedure Laterality Date   ACHILLES TENDON SURGERY Right 1994   BARIATRIC SURGERY     2005   cataracts Bilateral 2008   CHOLECYSTECTOMY     COLONOSCOPY  2015   Outside facility:  medium-sized lipoma in transverse colon.    EXTRACORPOREAL SHOCK WAVE LITHOTRIPSY Left 06/13/2018   Procedure: LEFT EXTRACORPOREAL SHOCK WAVE LITHOTRIPSY (ESWL) WITH MAC;  Surgeon: Homero Luster, MD;  Location: [facility]S;  Service: Urology;  Laterality: Left;   repair of wrist fx Right    SPINAL CORD STIMULATOR INSERTION N/A 12/01/2020   Procedure: Lumbar spinal cord stimulator placement;  Surgeon: Annis Kinder, MD;  Location: [facility] OR;  Service:  Neurosurgery;  Laterality: N/A;   TONSILLECTOMY     TOTAL KNEE ARTHROPLASTY Left 03/03/2024   Procedure: ARTHROPLASTY, KNEE, TOTAL;  Surgeon: Murleen Arms, MD;  Location: [facility]S;  Service: Orthopedics;  Laterality: Left;    Home Medications:  Prior to Admission medications   Medication Sig Start Date End Date Taking? Authorizing Provider  acetaminophen  (TYLENOL ) 500 MG tablet Take 2 tablets (1,000 mg total) by mouth every 8 (eight) hours as needed. 03/03/24 04/02/24 Yes Albertus Alt, PA-C  albuterol (VENTOLIN HFA) 108 (90 Base) MCG/ACT inhaler Inhale 1-2  puffs into Seth lungs every 6 (six) hours as needed for wheezing or shortness of breath. 12/11/16  Yes [provider]  ALPRAZolam (XANAX) 1 MG tablet Take 1 mg by mouth 3 (three) times daily as needed for anxiety.   Yes [provider]  aspirin 81 MG EC tablet Take 81 mg by mouth at bedtime. 04/19/17  Yes [provider]  aspirin EC 81 MG tablet Take 1 tablet (81 mg total) by mouth 2 (two) times daily for 28 days. Swallow whole. 03/04/24 04/01/24 Yes Albertus Alt, PA-C  atorvastatin (LIPITOR) 10 MG tablet Take 10 mg by mouth every evening. 08/19/12  Yes [provider]  celecoxib (CELEBREX) 100 MG capsule Take 1 capsule (100 mg total) by mouth 2 (two) times daily for 14 days. 03/03/24 03/17/24 Yes Albertus Alt, PA-C  cetirizine (ZYRTEC) 10 MG tablet Take 10 mg by mouth at bedtime.   Yes [provider]  citalopram (CELEXA) 40 MG tablet Take 40 mg by mouth every evening.   Yes [provider]  diclofenac Sodium (VOLTAREN) 1 % GEL Apply 2 g topically 4 (four) times daily as needed (pain). 09/08/16  Yes [provider]  levETIRAcetam  (KEPPRA  XR) 500 MG 24 hr tablet Take 2 tablets every night 02/09/23  Yes Jhonny Moss, MD  methocarbamol (ROBAXIN) 500 MG tablet Take 1 tablet (500 mg total) by mouth every 8 (eight) hours as needed for up to 10 days for muscle spasms. 03/03/24 03/13/24 Yes Albertus Alt, PA-C  montelukast (SINGULAIR) 10 MG tablet Take 10 mg by mouth at bedtime.   Yes [provider]  omeprazole (PRILOSEC) 40 MG capsule Take 1 capsule (40 mg total) by mouth daily for 21 days. 03/03/24 03/24/24 Yes Albertus Alt, PA-C  ondansetron  (ZOFRAN ) 4 MG tablet Take 1 tablet (4 mg total) by mouth every 8 (eight) hours as needed for up to 14 days for nausea or vomiting. 03/03/24 03/17/24 Yes Albertus Alt, PA-C  oxyCODONE (ROXICODONE) 5 MG immediate release tablet Take 1 tablet (5 mg total) by mouth every 4 (four)  hours as needed for up to 7 days for severe pain (pain score 7-10) or moderate pain (pain score 4-6). 03/03/24 03/10/24 Yes Albertus Alt, PA-C  oxyCODONE-acetaminophen  (PERCOCET) 10-325 MG tablet Take 1-2 tablets by mouth every 4 (four) hours as needed for severe pain.    Yes [provider]  Polyethyl Glycol-Propyl Glycol (SYSTANE) 0.4-0.3 % SOLN Place 1-2 drops into both eyes daily as needed (dry/irritated eyes).   Yes [provider]  polyethylene glycol (MIRALAX) 17 g packet Take 17 g by mouth daily. 03/03/24  Yes Albertus Alt, PA-C  tamsulosin (FLOMAX) 0.4 MG CAPS capsule Take 0.4 mg by mouth in Seth morning and at bedtime.   Yes [provider]  tirzepatide  (MOUNJARO ) 15 MG/0.5ML Pen Inject 15 mg into Seth skin once a week. [name] taking differently:  Inject 15 mg into Seth skin every Wednesday. 02/01/24  Yes   triamcinolone  cream (KENALOG) 0.5 % Apply 1 application  topically daily as needed (ear irritation). 04/14/20  Yes [provider]  Vibegron (GEMTESA) 75 MG TABS Take 75 mg by mouth every evening.   Yes [provider]  loratadine (CLARITIN) 10 MG tablet Take 10 mg by mouth daily.    [provider]  tirzepatide  (MOUNJARO ) 12.5 MG/0.5ML Pen Inject 12.5 mg into Seth skin once a week. [name] not taking: Reported on 02/15/2024 01/01/24      Scheduled Meds:  acetaminophen   1,000 mg Oral Q6H   amiodarone  400 mg Oral BID   aspirin  81 mg Oral BID   atorvastatin  10 mg Oral QPM   citalopram  40 mg Oral QPM   docusate sodium  100 mg Oral BID   insulin aspart  0-15 Units Subcutaneous TID WC   ketorolac   7.5 mg Intravenous Q6H   levETIRAcetam   1,000 mg Oral QPM   loratadine  10 mg Oral Daily   mirabegron ER  25 mg Oral Daily   montelukast  10 mg Oral QHS   pantoprazole  40 mg Oral Daily   tamsulosin  0.4 mg Oral BID   Continuous Infusions:  lactated ringers      PRN Meds: acetaminophen , albuterol, ALPRAZolam, diphenhydrAMINE ,  HYDROmorphone (DILAUDID) injection, menthol-cetylpyridinium **OR** phenol, methocarbamol **OR** methocarbamol (ROBAXIN) injection, naphazoline-glycerin, ondansetron  **OR** ondansetron  (ZOFRAN ) IV, oxyCODONE, polyethylene glycol, zolpidem  Allergies:    Allergies  Allergen Reactions   Latex Other (See Comments)   Sulfa Antibiotics Other (See Comments)   Amoxicillin-Pot Clavulanate Diarrhea and Nausea Only   Tape Rash   Social History:   Social History   Socioeconomic History   Marital status: Single    Spouse name: Not on file   Number of children: Not on file   Years of education: Not on file   Highest education level: Not on file  Occupational History   Not on file  Tobacco Use   Smoking status: Never   Smokeless tobacco: Never  Vaping Use   Vaping status: Never Used  Substance and Sexual Activity   Alcohol use: Yes    Comment: occ   Drug use: Never   Sexual activity: Not on file  Other Topics Concern   Not on file  Social History Narrative   Are you right handed or left handed? Right handed    Are you currently employed ? 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 No    Who lives with you? God son    What type of home do you live in: 1 story or 2 story? 2 story with steps     Decaf coffee    Decaf drinks    Social Drivers of Corporate investment banker Strain: Not on file  Food Insecurity: No Food Insecurity (03/03/2024)   Hunger Vital Sign    Worried About Running Out of Food in Seth Last Year: Never true    Ran Out of Food in Seth Last Year: Never true  Transportation Needs: No Transportation Needs (03/03/2024)   PRAPARE - Administrator, Civil Service (Medical): No    Lack of Transportation (Non-Medical): No  Physical Activity: Not on file  Stress: Not on file  Social Connections: Moderately Integrated (03/03/2024)   Social Connection and Isolation Panel [NHANES]    Frequency of  Communication with  Friends and Family: Three times a week    Frequency of Social Gatherings with Friends and Family: More than three times a week    Attends Religious Services: More than 4 times per year    Active Member of [employer] or Organizations: Yes    Attends Engineer, structural: More than 4 times per year    Marital Status: Divorced  Intimate Partner Violence: Not At Risk (03/03/2024)   Humiliation, Afraid, Rape, and Kick questionnaire    Fear of Current or Ex-Partner: No    Emotionally Abused: No    Physically Abused: No    Sexually Abused: No    Family History:   Family History  Problem Relation Age of Onset   Emphysema Mother    Lung cancer Father    Heart attack Brother    Colon cancer Neg Hx    Colon polyps Neg Hx      ROS:  Please see Seth history of present illness.  All other ROS reviewed and negative.     Physical Exam/Data: Vitals:   03/03/24 2131 03/04/24 0048 03/04/24 0443 03/04/24 0922  BP: (!) 136/92 129/77 117/88 (!) 114/92  Pulse: 97 99 99   Resp: 17 18 18 17   Temp: 98.3 F (36.8 C) 97.9 F (36.6 C) 98.9 F (37.2 C) 97.6 F (36.4 C)  TempSrc: Oral Oral Oral   SpO2: 96% 94% 97% 99%  Weight:      Height:        Intake/Output Summary (Last 24 hours) at 03/04/2024 1236 Last data filed at 03/04/2024 0900 Gross per 24 hour  Intake 2823.48 ml  Output 2425 ml  Net 398.48 ml      03/03/2024   11:51 AM 02/22/2024   10:02 AM 05/28/2023    3:51 PM  Last 3 Weights  Weight (lbs) 281 lb 281 lb 308 lb 9.6 oz  Weight (kg) 127.461 kg 127.461 kg 139.98 kg     Body mass index is 38.11 kg/m.   General:  Well nourished, well developed, in no acute distress HEENT: normal Neck: no JVD Vascular: Distal pulses 2+ bilaterally Cardiac:  normal S1, S2; irregularly irregular, tachycardic; no murmur  Lungs:  clear to auscultation bilaterally, no wheezing, rhonchi or rales  Abd: soft, nontender Ext: no edema Musculoskeletal:  No deformities Skin: warm and dry  Neuro: no focal  abnormalities noted Psych:  Normal affect   EKG:  Seth EKG was personally reviewed and demonstrates: Atrial flutter, variable block, heart rate 100 bpm  Telemetry:  Telemetry was personally reviewed and demonstrates: Atrial fibrillation, HR 120s  Relevant CV Studies: Echocardiogram, ordered pending results   Laboratory Data: High Sensitivity Troponin:  No results for input(s): "TROPONINIHS" in Seth last 720 hours.   Chemistry Recent Labs  Lab 03/04/24 0217  NA 134*  K 4.1  CL 105  CO2 25  GLUCOSE 88  BUN 17  CREATININE 0.93  CALCIUM 8.4*  GFRNONAA >60  ANIONGAP 4*    No results for input(s): "PROT", "ALBUMIN", "AST", "ALT", "ALKPHOS", "BILITOT" in Seth last 168 hours. Lipids No results for input(s): "CHOL", "TRIG", "HDL", "LABVLDL", "LDLCALC", "CHOLHDL" in Seth last 168 hours.  Hematology Recent Labs  Lab 03/04/24 0217  WBC 7.3  RBC 5.14  HGB 13.4  HCT 42.8  MCV 83.3  MCH 26.1  MCHC 31.3  RDW 16.3*  PLT 259   Thyroid No results for input(s): "TSH", "FREET4" in Seth last 168 hours.  BNPNo results for input(s): "  BNP", "PROBNP" in Seth last 168 hours.  DDimer No results for input(s): "DDIMER" in Seth last 168 hours.  Radiology/Studies:  DG Knee Left Port Result Date: 03/03/2024 CLINICAL DATA:  Postop. EXAM: PORTABLE LEFT KNEE - 1-2 VIEW COMPARISON:  None Available. FINDINGS: Left knee arthroplasty in expected alignment. No periprosthetic lucency or fracture. Recent postsurgical change includes air and edema in Seth soft tissues and joint space. IMPRESSION: Left knee arthroplasty without immediate postoperative complication. Electronically Signed   By: Chadwick Colonel M.D.   On: 03/03/2024 16:32   Assessment and Plan:  New onset atrial fibrillation/flutter with RVR s/p total knee arthroplasty, postop day 1 [name] has no known history of atrial fibrillation/flutter Denies any previous or current chest pain, shortness of breath, palpitations Previously was on  antihypertensives however has been off of them for some time Received PO Lopressor 12.5 mg x 1 dose yesterday Remains in A. Fib with HR 120s  Discussed with surgeon about doing full dose Eliquis 5 mg BID, Seth [name] is comfortable starting that on post-op day 3 Start PO amiodarone Ordered echocardiogram Close outpatient follow up made with [place] office   Risk Assessment/Risk Scores:    CHA2DS2-VASc Score = 3   This indicates a 3.2% annual risk of stroke. Seth [name]'s score is based upon: CHF History: 0 HTN History: 1 Diabetes History: 1 Stroke History: 0 Vascular Disease History: 0 Age Score: 1 Gender Score: 0   For questions or updates, please contact [facility] Please consult [email or web address] for contact info under   Signed, Jiles Mote, PA-C  03/04/2024 12:36 PM  [name] seen and examined, note reviewed with Seth signed Advanced Practice Provider. I personally reviewed laboratory data, imaging studies and relevant notes. I independently examined Seth [name] and formulated Seth important aspects of Seth plan. I have personally discussed Seth plan with Seth [name] and/or family. Comments or changes to Seth note/plan are indicated below.  [name] seen examined by Seth [name] bedside.  Postop atrial fibrillation Seth [name] denies any reports of A-fib in Seth past.  Currently is in atrial fibrillation during Seth time of my visit.  Telemetry review shows some runs of atrial flutter as well.  GEN:  Well nourished, well developed in no acute distress HEENT: Mucous membranes moist, good dentition NECK: No JVD; No carotid bruits LYMPHATICS: No lymphadenopathy CARDIAC: S1S2 noted, RRR, no murmurs, rubs, gallops RESPIRATORY:  Clear to auscultation without rales, wheezing or rhonchi  ABDOMEN: Soft, non-tender, non-distended, bowel sounds noted, no guarding EXTREMITIES:No cyanosis, no cyanosis, no clubbing MUSCULOSKELETAL: No deformity  SKIN: Warm and dry NEUROLOGIC:  Alert and oriented x 3,  nonfocal PSYCHIATRIC:  Normal affect, good insight   Postoperative atrial flutter/fibrillation History of TIA OSA Diabetes mellitus  Still atrial fibrillation with rate not well-controlled.  Will look to hopefully control Seth [name] heart rate for spontaneous conversion.  Unfortunately Seth [name] blood pressure is marginal and may not tolerate calcium channel blocker or beta-blocker.  Will start amiodarone 400 mg twice daily for Seth next 3 days and then 200 mg twice daily for 1 week and then taper to 200 mg daily.    Seth [name] CHA2DS2-VASc for me has been calculated 4 (diabetes mellitus, age greater than 45 and history of TIA) making him high risk for stroke and indicating Seth need for anticoagulation.  Seth [name] is recently postop in discussion with Seth orthopedic surgery team - they prefer to wait after 3 day post operative before using full dose anticoagulation.   Will get an echo to assess for  any structural abnormalities    Jerryl Morin DO, MS [facility] Attending Cardiologist [facility]  [address] [place], [place] [address] [phone] Website: [email or web address]

## 2024-03-04 NOTE — Progress Notes (Signed)
 Spoke with cardiologist on call who reviewed chart. EKG now showing sinus tachycardia. Patient is asymptomatic. Cardiologist recommended out patient follow up with the afib clinic. Would recommend rate control if heart rate remains elevated while in afib. Order for one dose of metoprolol placed. Per chart review, our team has also consulted cardiology earlier today and stated "Cardiology team available to round on him tomorrow morning for further evaluation/management."  Nicholas Bari, PA-C 03/04/24

## 2024-03-04 NOTE — Care Management Obs Status (Signed)
 MEDICARE OBSERVATION STATUS NOTIFICATION   Patient Details  Name: Seth [name] MRN: [id number] Date of Birth: 1956/08/15   Medicare Observation Status Notification Given:  Yes    Bari Leys, RN 03/04/2024, 10:09 AM

## 2024-03-04 NOTE — Progress Notes (Signed)
     Subjective:  Patient reports pain as mild.  Denies any chest pain or shortness of breath.  No issues overnight.   Notes that he is voiding frequently but has known prostate issues and is already on Flomax and Gemtesa.  Discussed telemetry findings of a flutter with mildly increased heart rate.  Did receive 1 dose of metoprolol overnight.  Spoke to cardiology on-call with plan for evaluation by their team today.   Objective:   VITALS:   Vitals:   03/03/24 1729 03/03/24 2131 03/04/24 0048 03/04/24 0443  BP: (!) 156/99 (!) 136/92 129/77 117/88  Pulse: 93 97 99 99  Resp: 17 17 18 18   Temp: 97.7 F (36.5 C) 98.3 F (36.8 C) 97.9 F (36.6 C) 98.9 F (37.2 C)  TempSrc: Oral Oral Oral Oral  SpO2: 98% 96% 94% 97%  Weight:      Height:        Sensation intact distally Intact pulses distally Dorsiflexion/Plantar flexion intact Incision: dressing C/D/I Compartment soft    Lab Results  Component Value Date   WBC 7.3 03/04/2024   HGB 13.4 03/04/2024   HCT 42.8 03/04/2024   MCV 83.3 03/04/2024   PLT 259 03/04/2024   BMET    Component Value Date/Time   NA 134 (L) 03/04/2024 0217   K 4.1 03/04/2024 0217   CL 105 03/04/2024 0217   CO2 25 03/04/2024 0217   GLUCOSE 88 03/04/2024 0217   BUN 17 03/04/2024 0217   CREATININE 0.93 03/04/2024 0217   CALCIUM 8.4 (L) 03/04/2024 0217   GFRNONAA >60 03/04/2024 0217      Xray: Total knee arthroplasty components in good position no adverse features  Assessment/Plan: 1 Day Post-Op   Principal Problem:   Primary osteoarthritis of left knee  Status post left TKA 03/03/2024  Atrial flutter: Plan for evaluation per cardiology team today, recs appreciated.  Post op recs: WB: WBAT Abx: ancef + vanc given hx of MRSA Imaging: PACU xrays DVT prophylaxis: Eliquis 2.5 mg twice daily starting postop day 1 Follow up: 2 weeks after surgery for a wound check with Dr. Pryor Browning at [facility].  Address: [address]  [address], [place], [place] [address]  Office Phone: [phone]   Murleen Arms 03/04/2024, 7:15 AM   Priscille Brought, MD  Contact information:   [phone] 7am-5pm epic message Dr. Pryor Browning, or call office for patient follow up: [phone] After hours and holidays please check [email or web address] for group call information for [facility]

## 2024-03-04 NOTE — Evaluation (Signed)
 Physical Therapy Evaluation Patient Details Name: Seth [name] MRN: [id number] DOB: 11-13-1955 Today's Date: 03/04/2024  History of Present Illness  Pt is 68 yo male admitted on 03/03/24 for L TKA.  Pt developed afib/aflutter with RVR during sx - cardiology consulted.  Pt with hx including but not limited to OSA, orthostatic hypotension, TIA, seizure, obesity, anxiety and depression, HTN, spinal cord stimulator  Clinical Impression  Pt is s/p TKA resulting in the deficits listed below (see PT Problem List). At baseline, pt independent.  He has arranged for support at home and has a RW.  Today, pt motivated to work with therapy.  He was able to ambulate 63' with RW ,CGA, and mild unsteadiness. Pt's HR 90's-120's during session.  Pt expected to progress with therapy. Pt will benefit from acute skilled PT to increase their independence and safety with mobility to allow discharge.          If plan is discharge home, recommend the following: A little help with walking and/or transfers;A little help with bathing/dressing/bathroom;Assistance with cooking/housework;Help with stairs or ramp for entrance   Can travel by private vehicle        Equipment Recommendations None recommended by PT  Recommendations for Other Services       Functional Status Assessment Patient has had a recent decline in their functional status and demonstrates the ability to make significant improvements in function in a reasonable and predictable amount of time.     Precautions / Restrictions Precautions Precautions: Fall;Knee Restrictions Weight Bearing Restrictions Per Provider Order: Yes LLE Weight Bearing Per Provider Order: Weight bearing as tolerated      Mobility  Bed Mobility Overal bed mobility: Needs Assistance Bed Mobility: Supine to Sit     Supine to sit: Min assist     General bed mobility comments: light min A for L LE    Transfers Overall transfer level: Needs assistance Equipment used:  Rolling walker (2 wheels) Transfers: Sit to/from Stand Sit to Stand: Min assist, From elevated surface           General transfer comment: Light min A ; cues for hand placement and L LE management    Ambulation/Gait Ambulation/Gait assistance: Contact guard assist, +2 safety/equipment Gait Distance (Feet): 40 Feet Assistive device: Rolling walker (2 wheels) Gait Pattern/deviations: Step-to pattern, Decreased stride length Gait velocity: decreased     General Gait Details: mild unsteadiness with 4 standing rest breaks; reports little dizzy but not lightheaded; had chair follow  Stairs            Wheelchair Mobility     Tilt Bed    Modified Rankin (Stroke Patients Only)       Balance Overall balance assessment: Needs assistance Sitting-balance support: No upper extremity supported Sitting balance-Leahy Scale: Good     Standing balance support: Bilateral upper extremity supported Standing balance-Leahy Scale: Poor                               Pertinent Vitals/Pain Pain Assessment Pain Assessment: 0-10 Pain Score: 1  Pain Location: L knee Pain Descriptors / Indicators: Grimacing Pain Intervention(s): Limited activity within patient's tolerance, Monitored during session, Premedicated before session, Repositioned, Other (comment) (Did have dilaudid prior)    Home Living Family/patient expects to be discharged to:: Private residence Living Arrangements: Other (Comment) Antonetta Batter) Available Help at Discharge: Family;Friend(s);Available 24 hours/day (has lined up family and friends to assist) Type of Home: House  Home Access: Stairs to enter Entrance Stairs-Rails: Right;Left;Can reach both Entrance Stairs-Number of Steps: 4   Home Layout: Multi-level;Able to live on main level with bedroom/bathroom Home Equipment: Grab bars - tub/shower;Rolling Walker (2 wheels);Cane - single point;Adaptive equipment      Prior Function Prior Level of Function :  Independent/Modified Independent;Driving             Mobility Comments: Could ambulate in home without AD, was using cane in community; was having some difficulty with stairs due to knee pain ADLs Comments: independent adls and iadls     Extremity/Trunk Assessment   Upper Extremity Assessment Upper Extremity Assessment: Overall WFL for tasks assessed    Lower Extremity Assessment Lower Extremity Assessment: LLE deficits/detail;RLE deficits/detail RLE Deficits / Details: ROM WFL; MMT 5/5 LLE Deficits / Details: Expected post op changes; ROM knee ~5 to 70 degrees; MMT: ankle 5/5, knee and hip 3/5 LLE Sensation: WNL    Cervical / Trunk Assessment Cervical / Trunk Assessment: Normal  Communication        Cognition Arousal: Alert Behavior During Therapy: WFL for tasks assessed/performed   PT - Cognitive impairments: No apparent impairments                                 Cueing       General Comments General comments (skin integrity, edema, etc.): Pt's HR 90's rest and 120's activity    Exercises     Assessment/Plan    PT Assessment Patient needs continued PT services  PT Problem List Decreased strength;Pain;Decreased range of motion;Decreased activity tolerance;Decreased balance;Decreased mobility;Decreased knowledge of use of DME       PT Treatment Interventions DME instruction;Therapeutic exercise;Gait training;Stair training;Functional mobility training;Therapeutic activities;Patient/family education;Modalities;Balance training    PT Goals (Current goals can be found in the Care Plan section)  Acute Rehab PT Goals Patient Stated Goal: return home PT Goal Formulation: With patient Time For Goal Achievement: 03/18/24 Potential to Achieve Goals: Good    Frequency 7X/week     Co-evaluation               AM-PAC PT "6 Clicks" Mobility  Outcome Measure Help needed turning from your back to your side while in a flat bed without using  bedrails?: A Little Help needed moving from lying on your back to sitting on the side of a flat bed without using bedrails?: A Little Help needed moving to and from a bed to a chair (including a wheelchair)?: A Little Help needed standing up from a chair using your arms (e.g., wheelchair or bedside chair)?: A Little Help needed to walk in hospital room?: A Little Help needed climbing 3-5 steps with a railing? : A Little 6 Click Score: 18    End of Session Equipment Utilized During Treatment: Gait belt Activity Tolerance: Patient tolerated treatment well Patient left: in chair;with call bell/phone within reach;with chair alarm set Nurse Communication: Mobility status PT Visit Diagnosis: Other abnormalities of gait and mobility (R26.89);Muscle weakness (generalized) (M62.81)    Time: 8295-6213 PT Time Calculation (min) (ACUTE ONLY): 34 min   Charges:   PT Evaluation $PT Eval Low Complexity: 1 Low PT Treatments $Gait Training: 8-22 mins PT General Charges $$ ACUTE PT VISIT: 1 Visit         Cyd Dowse, PT Acute Rehab Services [facility] [phone]   Carolynn Citrin 03/04/2024, 1:28 PM

## 2024-03-04 NOTE — Progress Notes (Signed)
 Pt noted to have irregular HR upon assessment at 2300. Telemetry showed ST & pvc. Pt denies history of irregular HR/rhythm. Pt Hr elevated to 115 @ 0030 with telemetry showing Afib. Pt asymptomatic. Notified On call immediately. 2nd call placed 0130. Return call from  , PA with order for EKG who will review and place orders.  While awaiting EKG, pt had transitioned from Afib to A flutter (during EKG) and post EKG went to ST. Pt sleeping and asymptomatic during events.  Pt back into Afib, attempted to capture with second EKG. Pt had converted back to aflutter for EKG. Pt symptomatic. Labs drawn, WNL. Pt ordered and given 12.5 metoprolol. Pt stated "I used to take metoprolol". Per anesthesiology note, cardiology to see patient today.

## 2024-03-04 NOTE — TOC Transition Note (Signed)
 Transition of Care [facility]) - Discharge Note   Patient Details  Name: Seth [name] MRN: [id number] Date of Birth: 04/22/1956  [facility]) CM/SW Contact:  Bari Leys, RN Phone Number: 03/04/2024, 11:14 AM   Clinical Narrative: Met with patient at bedside to review dc therapy and home equipment needs, pt confirmed HH PT with [facility], has RW, no home equipment needs. No TOC needs.        Final next level of care: Home w Home Health Services     Patient Goals and CMS Choice Patient states their goals for this hospitalization and ongoing recovery are:: return home          Discharge Placement                       Discharge Plan and Services Additional resources added to the After Visit Summary for                  DME Arranged: CPM DME Agency: [employer]       HH Arranged: PT HH Agency: [facility]        Social Drivers of Health (SDOH) Interventions SDOH Screenings   Food Insecurity: No Food Insecurity (03/03/2024)  Housing: Low Risk  (03/03/2024)  Transportation Needs: No Transportation Needs (03/03/2024)  Utilities: Not At Risk (03/03/2024)  Social Connections: Moderately Integrated (03/03/2024)  Tobacco Use: Low Risk  (03/03/2024)     Readmission Risk Interventions     No data to display

## 2024-03-04 NOTE — Progress Notes (Signed)
 Physical Therapy Treatment Patient Details Name: Seth [name] MRN: [id number] DOB: 1956/05/16 Today's Date: 03/04/2024   History of Present Illness Pt is 68 yo male admitted on 03/03/24 for L TKA.  Pt developed afib/aflutter with RVR during sx - cardiology consulted.  Pt with hx including but not limited to OSA, orthostatic hypotension, TIA, seizure, obesity, anxiety and depression, HTN, spinal cord stimulator    PT Comments  Pt with mobility similar to this morning's session but did have increased pain/cramping.  He ambulated 51' with RW , CGA, and rest breaks.  He did have some weakness in the last 10'.  Denied lightheaded , just reports arms weak.  BP was elevated at EOB (question accuracy as pt moving arm) and was 103/82 when returned to bed.  Notified RN - reports pt just started on Amiodarone.     Needs further therapy prior to return home.    If plan is discharge home, recommend the following: A little help with walking and/or transfers;A little help with bathing/dressing/bathroom;Assistance with cooking/housework;Help with stairs or ramp for entrance   Can travel by private vehicle        Equipment Recommendations  None recommended by PT    Recommendations for Other Services       Precautions / Restrictions Precautions Precautions: Fall;Knee Restrictions Weight Bearing Restrictions Per Provider Order: Yes LLE Weight Bearing Per Provider Order: Weight bearing as tolerated     Mobility  Bed Mobility Overal bed mobility: Needs Assistance Bed Mobility: Supine to Sit, Sit to Supine     Supine to sit: Min assist Sit to supine: Min assist   General bed mobility comments: assist for L LE    Transfers Overall transfer level: Needs assistance Equipment used: Rolling walker (2 wheels) Transfers: Sit to/from Stand Sit to Stand: Min assist, From elevated surface           General transfer comment: Bed significnatly elevated; cues for hand placement     Ambulation/Gait Ambulation/Gait assistance: Contact guard assist Gait Distance (Feet): 40 Feet Assistive device: Rolling walker (2 wheels) Gait Pattern/deviations: Step-to pattern, Decreased stride length Gait velocity: decreased     General Gait Details: mild unsteadiness with 4 standing rest breaks; in last 10' reports feeling weak and quickly returning to sitting on bed (see vitals below)   Stairs             Wheelchair Mobility     Tilt Bed    Modified Rankin (Stroke Patients Only)       Balance Overall balance assessment: Needs assistance Sitting-balance support: No upper extremity supported Sitting balance-Leahy Scale: Good     Standing balance support: Bilateral upper extremity supported, Reliant on assistive device for balance Standing balance-Leahy Scale: Poor                              Communication    Cognition Arousal: Alert Behavior During Therapy: WFL for tasks assessed/performed   PT - Cognitive impairments: No apparent impairments                                Cueing    Exercises Total Joint Exercises Ankle Circles/Pumps: AROM, Both, 10 reps, Supine Quad Sets: AROM, Both, 10 reps, Supine Heel Slides: AAROM, Left, 10 reps, Supine, Limitations Heel Slides Limitations: limited ROM Hip ABduction/ADduction: AAROM, Left, 10 reps, Supine Knee Flexion: AAROM, Left, 5 reps,  Seated    General Comments General comments (skin integrity, edema, etc.): HR 103 bpm rest and 120's activity.  Pt felt weak in last 10' of ambulation.  BP taken at EOB was 130/104 (question accuracy as pt moving arm at times).  Pt returned to supine , once settled in supine BP 103/82 and pt reports feeling better. Notified RN      Pertinent Vitals/Pain Pain Assessment Pain Assessment: 0-10 Pain Score: 5  Pain Location: L knee Pain Descriptors / Indicators: Cramping Pain Intervention(s): Limited activity within patient's tolerance, Monitored  during session, Premedicated before session, Repositioned, Ice applied    Home Living                          Prior Function            PT Goals (current goals can now be found in the care plan section) Acute Rehab PT Goals Patient Stated Goal: return home PT Goal Formulation: With patient Time For Goal Achievement: 03/18/24 Potential to Achieve Goals: Good Progress towards PT goals: Progressing toward goals    Frequency    7X/week      PT Plan      Co-evaluation              AM-PAC PT "6 Clicks" Mobility   Outcome Measure  Help needed turning from your back to your side while in a flat bed without using bedrails?: A Little Help needed moving from lying on your back to sitting on the side of a flat bed without using bedrails?: A Little Help needed moving to and from a bed to a chair (including a wheelchair)?: A Little Help needed standing up from a chair using your arms (e.g., wheelchair or bedside chair)?: A Little Help needed to walk in hospital room?: A Little Help needed climbing 3-5 steps with a railing? : Total 6 Click Score: 16    End of Session Equipment Utilized During Treatment: Gait belt Activity Tolerance: Patient tolerated treatment well Patient left: with call bell/phone within reach;in bed;with SCD's reapplied;with bed alarm set (bed was slightly elevated in order to tilt in trendlenberg with head elevated in order to keep pt from sliding down but keeping knees straight) Nurse Communication: Mobility status PT Visit Diagnosis: Other abnormalities of gait and mobility (R26.89);Muscle weakness (generalized) (M62.81)     Time: 1610-9604 PT Time Calculation (min) (ACUTE ONLY): 36 min  Charges:    $Gait Training: 8-22 mins $Therapeutic Exercise: 8-22 mins PT General Charges $$ ACUTE PT VISIT: 1 Visit                     Cyd Dowse, PT Acute Rehab [facility] [phone]    Carolynn Citrin 03/04/2024, 4:31 PM

## 2024-03-04 NOTE — Plan of Care (Signed)

## 2024-03-04 NOTE — Plan of Care (Signed)
   Problem: Education: Goal: Knowledge of General Education information will improve Description: Including pain rating scale, medication(s)/side effects and non-pharmacologic comfort measures Outcome: Progressing   Problem: Health Behavior/Discharge Planning: Goal: Ability to manage health-related needs will improve Outcome: Progressing   Problem: Activity: Goal: Risk for activity intolerance will decrease Outcome: Progressing   Problem: Nutrition: Goal: Adequate nutrition will be maintained Outcome: Progressing   Problem: Coping: Goal: Level of anxiety will decrease Outcome: Progressing   Problem: Pain Managment: Goal: General experience of comfort will improve and/or be controlled Outcome: Progressing   Problem: Safety: Goal: Ability to remain free from injury will improve Outcome: Progressing   Problem: Skin Integrity: Goal: Risk for impaired skin integrity will decrease Outcome: Progressing

## 2024-03-05 ENCOUNTER — Observation Stay (HOSPITAL_COMMUNITY)

## 2024-03-05 DIAGNOSIS — M1712 Unilateral primary osteoarthritis, left knee: Secondary | ICD-10-CM | POA: Diagnosis not present

## 2024-03-05 DIAGNOSIS — I4891 Unspecified atrial fibrillation: Secondary | ICD-10-CM | POA: Diagnosis not present

## 2024-03-05 LAB — CBC
HCT: 41.7 % (ref 39.0–52.0)
Hemoglobin: 13.1 g/dL (ref 13.0–17.0)
MCH: 26.3 pg (ref 26.0–34.0)
MCHC: 31.4 g/dL (ref 30.0–36.0)
MCV: 83.7 fL (ref 80.0–100.0)
Platelets: 249 10*3/uL (ref 150–400)
RBC: 4.98 MIL/uL (ref 4.22–5.81)
RDW: 16.6 % — ABNORMAL HIGH (ref 11.5–15.5)
WBC: 10.3 10*3/uL (ref 4.0–10.5)
nRBC: 0 % (ref 0.0–0.2)

## 2024-03-05 LAB — ECHOCARDIOGRAM COMPLETE
AR max vel: 3.41 cm2
AV Area VTI: 3.62 cm2
AV Area mean vel: 3.55 cm2
AV Mean grad: 3 mmHg
AV Peak grad: 4.8 mmHg
Ao pk vel: 1.09 m/s
Area-P 1/2: 3.16 cm2
Height: 72 in
MV M vel: 3.17 m/s
MV Peak grad: 40.2 mmHg
S' Lateral: 3.6 cm
Weight: 4496.01 [oz_av]

## 2024-03-05 LAB — GLUCOSE, CAPILLARY
Glucose-Capillary: 88 mg/dL (ref 70–99)
Glucose-Capillary: 97 mg/dL (ref 70–99)
Glucose-Capillary: 120 mg/dL — ABNORMAL HIGH (ref 70–99)
Glucose-Capillary: 99 mg/dL (ref 70–99)

## 2024-03-05 MED ORDER — GABAPENTIN 300 MG PO CAPS: 300.0000 mg | ORAL_CAPSULE | Freq: Three times a day (TID) | ORAL | Status: DC | PRN

## 2024-03-05 MED ORDER — APIXABAN 2.5 MG PO TABS
2.5000 mg | ORAL_TABLET | Freq: Once | ORAL | Status: AC
  Administered 2024-03-05: 2.5 mg via ORAL
  Filled 2024-03-05: qty 1

## 2024-03-05 MED ORDER — APIXABAN 5 MG PO TABS: 5.0000 mg | ORAL_TABLET | Freq: Two times a day (BID) | ORAL | Status: DC

## 2024-03-05 MED ORDER — ACETAMINOPHEN 325 MG PO TABS
650.0000 mg | ORAL_TABLET | Freq: Four times a day (QID) | ORAL | Status: DC
  Administered 2024-03-05 – 2024-03-06 (×3): 650 mg via ORAL
  Filled 2024-03-05 (×3): qty 2

## 2024-03-05 NOTE — Progress Notes (Signed)
 Orthopedic Tech Progress Note Patient Details:  Seth [name] 12-19-1955 [id number]  CPM Left Knee CPM Left Knee: On Left Knee Flexion (Degrees): 60 Left Knee Extension (Degrees): 0  Post Interventions Patient Tolerated: Well Instructions Provided: Adjustment of device, Care of device  [name] 03/05/2024, 5:26 PM

## 2024-03-05 NOTE — Progress Notes (Signed)
 Physical Therapy Treatment Patient Details Name: Seth [name] MRN: [id number] DOB: 01-07-1956 Today's Date: 03/05/2024   History of Present Illness Pt is 68 yo male admitted on 03/03/24 for L TKA.  Pt developed afib/aflutter with RVR during sx - cardiology consulted.  Pt with hx including but not limited to OSA, orthostatic hypotension, TIA, seizure, obesity, anxiety and depression, HTN, spinal cord stimulator    PT Comments  Pt limited by pain.  Required heavy mod A of 2 to stand from recliner.  He was able to ambulate 8' with min A this afternoon but antalgic pattern and rest breaks. HR remains 90-125 bpm with therapy.  Needs further therapy prior to return home.  Pt did ask about CPM, has one at home.  Sent secure chat to ortho PA in regards to CPM and pt's pain level limiting therapy.     If plan is discharge home, recommend the following: Assistance with cooking/housework;Help with stairs or ramp for entrance;A lot of help with walking and/or transfers;A lot of help with bathing/dressing/bathroom   Can travel by private vehicle        Equipment Recommendations  None recommended by PT    Recommendations for Other Services       Precautions / Restrictions Precautions Precautions: Fall;Knee Restrictions LLE Weight Bearing Per Provider Order: Weight bearing as tolerated     Mobility  Bed Mobility Overal bed mobility: Needs Assistance Bed Mobility: Sit to Supine     Supine to sit: Mod assist, HOB elevated Sit to supine: Used rails, Min assist   General bed mobility comments: increased time, effort from pt, cues, assist for L LE.  Pt able to pull self up in bed but in trendlenberg and with rails    Transfers Overall transfer level: Needs assistance Equipment used: Rolling walker (2 wheels) Transfers: Sit to/from Stand Sit to Stand: Mod assist, +2 physical assistance           General transfer comment: Required heavy mod A x 2 to stand from recliner with cues for hand  placement    Ambulation/Gait Ambulation/Gait assistance: Min assist, +2 safety/equipment Gait Distance (Feet): 8 Feet Assistive device: Rolling walker (2 wheels) Gait Pattern/deviations: Step-to pattern, Antalgic, Decreased weight shift to left Gait velocity: decreased     General Gait Details: Only taking small steps in room , very antalgic, 3-4 rest breaks, limited by pain, cues and assist to stay close to [employer] Mobility     Tilt Bed    Modified Rankin (Stroke Patients Only)       Balance Overall balance assessment: Needs assistance Sitting-balance support: No upper extremity supported Sitting balance-Leahy Scale: Good     Standing balance support: Bilateral upper extremity supported, Reliant on assistive device for balance Standing balance-Leahy Scale: Poor                              Communication    Cognition Arousal: Alert Behavior During Therapy: WFL for tasks assessed/performed   PT - Cognitive impairments: No apparent impairments                                Cueing    Exercises Total Joint Exercises Ankle Circles/Pumps: AROM, Both, 10 reps, Supine Quad Sets: AROM, Both, 10 reps, Supine Goniometric ROM: Limited by  pain ~5 to 30 degrees Other Exercises Other Exercises: Held exercises other than ankle pumps and quad sets due to pain    General Comments General comments (skin integrity, edema, etc.): HR 90's-125 bpm during session      Pertinent Vitals/Pain Pain Assessment Pain Assessment: 0-10 Pain Score: 7  Pain Location: L knee Pain Descriptors / Indicators: Cramping, Throbbing Pain Intervention(s): Limited activity within patient's tolerance, Monitored during session, Premedicated before session, Repositioned, Ice applied    Home Living                          Prior Function            PT Goals (current goals can now be found in the care plan section) Progress  towards PT goals: Progressing toward goals    Frequency    7X/week      PT Plan      Co-evaluation              AM-PAC PT "6 Clicks" Mobility   Outcome Measure  Help needed turning from your back to your side while in a flat bed without using bedrails?: A Lot Help needed moving from lying on your back to sitting on the side of a flat bed without using bedrails?: A Lot Help needed moving to and from a bed to a chair (including a wheelchair)?: A Lot Help needed standing up from a chair using your arms (e.g., wheelchair or bedside chair)?: Total Help needed to walk in hospital room?: Total Help needed climbing 3-5 steps with a railing? : Total 6 Click Score: 9    End of Session Equipment Utilized During Treatment: Gait belt Activity Tolerance: Patient limited by pain Patient left: with call bell/phone within reach;with SCD's reapplied;in bed;with bed alarm set Nurse Communication: Mobility status;Patient requests pain meds PT Visit Diagnosis: Other abnormalities of gait and mobility (R26.89);Muscle weakness (generalized) (M62.81)     Time: 1429-1500 PT Time Calculation (min) (ACUTE ONLY): 31 min  Charges:    $Gait Training: 8-22 mins $Therapeutic Activity: 8-22 mins PT General Charges $$ ACUTE PT VISIT: 1 Visit                     Cyd Dowse, PT Acute Rehab Services [facility] [phone]    Carolynn Citrin 03/05/2024, 3:09 PM

## 2024-03-05 NOTE — Progress Notes (Signed)
 Physical Therapy Treatment Patient Details Name: Seth [name] MRN: [id number] DOB: 1956/04/02 Today's Date: 03/05/2024   History of Present Illness Pt is 68 yo male admitted on 03/03/24 for L TKA.  Pt developed afib/aflutter with RVR during sx - cardiology consulted.  Pt with hx including but not limited to OSA, orthostatic hypotension, TIA, seizure, obesity, anxiety and depression, HTN, spinal cord stimulator    PT Comments  Pt reports having a bad night with severe knee pain. Reports down to a 5/10 now at rest but 8/10 with activity.  Pt required increased time and cues for all transfers.  Requiring mod A for supine/sit, and min A to stand but from high perch position.  Only able to tolerate transfer to chair.  Pt requesting further pain meds (dilaudid if possible) - notified RN of pt's request and that pt not likely to clear therapy today so if IV meds needed/ordered would be ok.  Plan to f/u in afternoon .   If plan is discharge home, recommend the following: Assistance with cooking/housework;Help with stairs or ramp for entrance;A lot of help with walking and/or transfers;A lot of help with bathing/dressing/bathroom   Can travel by private vehicle        Equipment Recommendations  None recommended by PT    Recommendations for Other Services       Precautions / Restrictions Precautions Precautions: Fall;Knee Restrictions LLE Weight Bearing Per Provider Order: Weight bearing as tolerated     Mobility  Bed Mobility Overal bed mobility: Needs Assistance Bed Mobility: Supine to Sit     Supine to sit: Mod assist, HOB elevated     General bed mobility comments: increased time, effort from pt, cues, assist for L LE and trunk    Transfers Overall transfer level: Needs assistance Equipment used: Rolling walker (2 wheels) Transfers: Sit to/from Stand Sit to Stand: Min assist, From elevated surface           General transfer comment: Bed significnatly elevated; cues for hand  placement; required 3 attempts and mainly coming up on R LE; increased time    Ambulation/Gait Ambulation/Gait assistance: Min assist Gait Distance (Feet): 2 Feet Assistive device: Rolling walker (2 wheels) Gait Pattern/deviations: Step-to pattern, Decreased weight shift to left, Antalgic Gait velocity: decreased     General Gait Details: small steps to chair with cues for sequencing; unsteady and antalgic   Stairs             Wheelchair Mobility     Tilt Bed    Modified Rankin (Stroke Patients Only)       Balance Overall balance assessment: Needs assistance Sitting-balance support: No upper extremity supported Sitting balance-Leahy Scale: Good     Standing balance support: Bilateral upper extremity supported, Reliant on assistive device for balance Standing balance-Leahy Scale: Poor                              Communication    Cognition Arousal: Alert Behavior During Therapy: WFL for tasks assessed/performed   PT - Cognitive impairments: No apparent impairments                                Cueing    Exercises Total Joint Exercises Ankle Circles/Pumps: AROM, Both, 10 reps, Supine Quad Sets: AROM, Both, 10 reps, Supine Other Exercises Other Exercises: Held exercises other than ankle pumps and quad sets  due to pain    General Comments General comments (skin integrity, edema, etc.): HR 90's-125 bpm during session.  BP was stable 130's/70's.  O2 sats stable      Pertinent Vitals/Pain Pain Assessment Pain Assessment: 0-10 Pain Score: 7  Pain Location: L knee Pain Descriptors / Indicators: Cramping, Throbbing Pain Intervention(s): Limited activity within patient's tolerance, Monitored during session, Premedicated before session, Repositioned, Patient requesting pain meds-RN notified, Ice applied    Home Living                          Prior Function            PT Goals (current goals can now be found in the  care plan section) Progress towards PT goals: Progressing toward goals    Frequency    7X/week      PT Plan      Co-evaluation              AM-PAC PT "6 Clicks" Mobility   Outcome Measure  Help needed turning from your back to your side while in a flat bed without using bedrails?: A Lot Help needed moving from lying on your back to sitting on the side of a flat bed without using bedrails?: A Lot Help needed moving to and from a bed to a chair (including a wheelchair)?: A Lot Help needed standing up from a chair using your arms (e.g., wheelchair or bedside chair)?: A Lot Help needed to walk in hospital room?: Total Help needed climbing 3-5 steps with a railing? : Total 6 Click Score: 10    End of Session Equipment Utilized During Treatment: Gait belt Activity Tolerance: Patient limited by pain Patient left: with call bell/phone within reach;with SCD's reapplied;in chair Nurse Communication: Mobility status PT Visit Diagnosis: Other abnormalities of gait and mobility (R26.89);Muscle weakness (generalized) (M62.81)     Time: 4098-1191 PT Time Calculation (min) (ACUTE ONLY): 44 min  Charges:    $Therapeutic Exercise: 8-22 mins $Therapeutic Activity: 23-37 mins PT General Charges $$ ACUTE PT VISIT: 1 Visit                     Cyd Dowse, PT Acute Rehab [facility] [phone]    Carolynn Citrin 03/05/2024, 12:51 PM

## 2024-03-05 NOTE — Plan of Care (Signed)
   Problem: Activity: Goal: Risk for activity intolerance will decrease Outcome: Progressing   Problem: Pain Managment: Goal: General experience of comfort will improve and/or be controlled Outcome: Progressing   Problem: Safety: Goal: Ability to remain free from injury will improve Outcome: Progressing

## 2024-03-05 NOTE — Progress Notes (Signed)
 Progress Note  Patient Name: Seth [name] Date of Encounter: 03/05/2024  Primary Cardiologist: Vishnu P Mallipeddi, MD   Subjective   Patient seen and examined his bedside.  He tells me he was sleeping all night.  Inpatient Medications    Scheduled Meds:  amiodarone  400 mg Oral BID   Followed by   Cecily Cohen ON 03/07/2024] amiodarone  200 mg Oral BID   Followed by   Cecily Cohen ON 03/15/2024] amiodarone  200 mg Oral Daily   aspirin  81 mg Oral BID   atorvastatin  10 mg Oral QPM   citalopram  40 mg Oral QPM   docusate sodium  100 mg Oral BID   insulin aspart  0-15 Units Subcutaneous TID WC   levETIRAcetam   1,000 mg Oral QPM   loratadine  10 mg Oral Daily   mirabegron ER  25 mg Oral Daily   montelukast  10 mg Oral QHS   pantoprazole  40 mg Oral Daily   tamsulosin  0.4 mg Oral BID   Continuous Infusions:  lactated ringers      PRN Meds: acetaminophen , albuterol, ALPRAZolam, diphenhydrAMINE , HYDROmorphone (DILAUDID) injection, menthol-cetylpyridinium **OR** phenol, methocarbamol **OR** methocarbamol (ROBAXIN) injection, naphazoline-glycerin, ondansetron  **OR** ondansetron  (ZOFRAN ) IV, oxyCODONE, polyethylene glycol, zolpidem   Vital Signs    Vitals:   03/04/24 1400 03/04/24 2119 03/05/24 0547 03/05/24 1226  BP: 116/72 113/71 123/74 102/61  Pulse:   96 (!) 44  Resp: 18 18 18 17   Temp: (!) 97.5 F (36.4 C) 98.7 F (37.1 C) 98.2 F (36.8 C) 97.8 F (36.6 C)  TempSrc: Oral  Oral   SpO2: 99% 100% 93% 97%  Weight:      Height:        Intake/Output Summary (Last 24 hours) at 03/05/2024 1339 Last data filed at 03/05/2024 0900 Gross per 24 hour  Intake 240 ml  Output 800 ml  Net -560 ml   Filed Weights   03/03/24 1151  Weight: 127.5 kg    Telemetry    Atrial fibrillation with controlled ventricular rate- Personally Reviewed  ECG     - Personally Reviewed  Physical Exam   General: Comfortable Head: Atraumatic, normal size  Eyes: PEERLA, EOMI  Neck: Supple, normal  JVD Cardiac: Normal S1, S2; RRR; no murmurs, rubs, or gallops Lungs: Clear to auscultation bilaterally Abd: Soft, nontender, no hepatomegaly  Ext: warm, no edema Musculoskeletal: No deformities, BUE and BLE strength normal and equal Skin: Warm and dry, no rashes   Neuro: Alert and oriented to person, place, time, and situation, CNII-XII grossly intact, no focal deficits  Psych: Normal mood and affect   Labs    Chemistry Recent Labs  Lab 03/04/24 0217  NA 134*  K 4.1  CL 105  CO2 25  GLUCOSE 88  BUN 17  CREATININE 0.93  CALCIUM 8.4*  GFRNONAA >60  ANIONGAP 4*     Hematology Recent Labs  Lab 03/04/24 0217 03/05/24 0351  WBC 7.3 10.3  RBC 5.14 4.98  HGB 13.4 13.1  HCT 42.8 41.7  MCV 83.3 83.7  MCH 26.1 26.3  MCHC 31.3 31.4  RDW 16.3* 16.6*  PLT 259 249    Cardiac EnzymesNo results for input(s): "TROPONINI" in the last 168 hours. No results for input(s): "TROPIPOC" in the last 168 hours.   BNPNo results for input(s): "BNP", "PROBNP" in the last 168 hours.   DDimer No results for input(s): "DDIMER" in the last 168 hours.   Radiology    DG Knee  Left Port Result Date: 03/03/2024 CLINICAL DATA:  Postop. EXAM: PORTABLE LEFT KNEE - 1-2 VIEW COMPARISON:  None Available. FINDINGS: Left knee arthroplasty in expected alignment. No periprosthetic lucency or fracture. Recent postsurgical change includes air and edema in the soft tissues and joint space. IMPRESSION: Left knee arthroplasty without immediate postoperative complication. Electronically Signed   By: Chadwick Colonel M.D.   On: 03/03/2024 16:32    Cardiac Studies     Patient Profile     68 y.o. male with new onset atrial fibrillation  Assessment & Plan    Postoperative atrial flutter/fibrillation History of TIA OSA Diabetes mellitus   Still atrial fibrillation but rate control, he is on amiodarone 200 mg daily for rate control.  Unfortunately he is not fully anticoagulated so we will avoid to cardiovert  this patient at this time. Hopefully if we control the heart rate he would spontaneously convert.  Unfortunately his blood pressure is marginal and may not tolerate calcium channel blocker or beta-blocker.    His CHA2DS2-VASc for me has been calculated 4 (diabetes mellitus, age greater than 92 and history of TIA) making him high risk for stroke and indicating the need for anticoagulation.  He is recently postop in discussion with the orthopedic surgery team - they prefer to wait after 3 day post operative before using full dose anticoagulation.     Echo done this morning pending official report, his EF appears to be normal with mild MR.      For questions or updates, please contact [facility] Please consult [email or web address] for contact info under Cardiology/STEMI.      Signed, Mikaila Grunert, DO  03/05/2024, 1:39 PM

## 2024-03-05 NOTE — Progress Notes (Signed)
     Subjective:  Patient reports pain as mild, better than yesterday.  Denies any chest pain or shortness of breath.  No issues overnight.   Seen by cardiology yesterday, see separate note.  Patient reports not having his echo yet, possibly able to go home today or tomorrow.  Denies numbness or tingling.  No other complaints at this time.   Objective:   VITALS:   Vitals:   03/04/24 0922 03/04/24 1400 03/04/24 2119 03/05/24 0547  BP: (!) 114/92 116/72 113/71 123/74  Pulse:    96  Resp: 17 18 18 18   Temp: 97.6 F (36.4 C) (!) 97.5 F (36.4 C) 98.7 F (37.1 C) 98.2 F (36.8 C)  TempSrc:  Oral  Oral  SpO2: 99% 99% 100% 93%  Weight:      Height:        AAOx4 siting comfortably, in NAD Sensation intact distally Intact pulses distally Dorsiflexion/Plantar flexion intact Incision: dressing C/D/I Compartment soft Wiggles toes appropriately    Lab Results  Component Value Date   WBC 10.3 03/05/2024   HGB 13.1 03/05/2024   HCT 41.7 03/05/2024   MCV 83.7 03/05/2024   PLT 249 03/05/2024   BMET    Component Value Date/Time   NA 134 (L) 03/04/2024 0217   K 4.1 03/04/2024 0217   CL 105 03/04/2024 0217   CO2 25 03/04/2024 0217   GLUCOSE 88 03/04/2024 0217   BUN 17 03/04/2024 0217   CREATININE 0.93 03/04/2024 0217   CALCIUM 8.4 (L) 03/04/2024 0217   GFRNONAA >60 03/04/2024 0217      Xray: Total knee arthroplasty components in good position no adverse features  Assessment/Plan: 2 Days Post-Op   Principal Problem:   Primary osteoarthritis of left knee  Status post left TKA 03/03/2024  Atrial flutter: Cardiology eval yesterday, likely echo today with anticipated discharge today or tomorrow and close outpatient f/u with cardiology.  Post op recs: WB: WBAT Abx: ancef + vanc given hx of MRSA Imaging: PACU xrays DVT prophylaxis: Eliquis 2.5 mg twice daily postop day 1 and 2, then Eliquis 5 mg twice daily starting postop day 3 for 4 weeks Follow up: 2 weeks after  surgery for a wound check with Dr. Pryor Browning at [facility].  Address: [address], [place], [place] [address]  Office Phone: [phone]   Albertus Alt 03/05/2024, 8:04 AM     Contact information:   Weekdays 7am-5pm epic message Dr. Pryor Browning, or call office for patient follow up: [phone] After hours and holidays please check [email or web address] for group call information for [facility]

## 2024-03-05 NOTE — Progress Notes (Signed)
 Orthopedic Tech Progress Note Patient Details:  Seth [name] [birth year] [id number]  CPM Left Knee CPM Left Knee: Off Left Knee Flexion (Degrees): 60 Left Knee Extension (Degrees): 0  Post Interventions Patient Tolerated: Well Instructions Provided: Adjustment of device, Care of device  [name] 03/05/2024, 7:30 PM

## 2024-03-05 NOTE — Progress Notes (Signed)
*  PRELIMINARY RESULTS* Echocardiogram 2D Echocardiogram has been performed.  Glenna Lango 03/05/2024, 10:03 AM

## 2024-03-05 NOTE — Progress Notes (Signed)
 Pt skin was observed to have a skin abrasion on lower left back with some scratches around the lower back it look to be friction from pt being slide up with the bed pads bed pad was removed and bacitracin  was out on back RN Tu was witness and also assess skin with LPN

## 2024-03-06 ENCOUNTER — Other Ambulatory Visit (HOSPITAL_COMMUNITY): Payer: Self-pay

## 2024-03-06 DIAGNOSIS — Z96652 Presence of left artificial knee joint: Secondary | ICD-10-CM | POA: Diagnosis not present

## 2024-03-06 DIAGNOSIS — M1712 Unilateral primary osteoarthritis, left knee: Secondary | ICD-10-CM | POA: Diagnosis not present

## 2024-03-06 LAB — GLUCOSE, CAPILLARY
Glucose-Capillary: 83 mg/dL (ref 70–99)
Glucose-Capillary: 124 mg/dL — ABNORMAL HIGH (ref 70–99)

## 2024-03-06 MED ORDER — APIXABAN 5 MG PO TABS
5.0000 mg | ORAL_TABLET | Freq: Two times a day (BID) | ORAL | 0 refills | Status: DC
  Filled 2024-03-06: qty 28, 14d supply, fill #0

## 2024-03-06 MED ORDER — APIXABAN 2.5 MG PO TABS: 5.0000 mg | ORAL_TABLET | Freq: Two times a day (BID) | ORAL | 0 refills | Status: DC

## 2024-03-06 MED ORDER — AMIODARONE HCL 200 MG PO TABS
ORAL_TABLET | ORAL | 0 refills | Status: DC
Start: 2024-03-06 — End: 2024-03-11
  Filled 2024-03-06: qty 29, 16d supply, fill #0

## 2024-03-06 MED ORDER — GABAPENTIN 300 MG PO CAPS: 300.0000 mg | ORAL_CAPSULE | Freq: Three times a day (TID) | ORAL | 0 refills | Status: AC | PRN

## 2024-03-06 MED ORDER — APIXABAN 5 MG PO TABS
5.0000 mg | ORAL_TABLET | Freq: Two times a day (BID) | ORAL | Status: DC
  Administered 2024-03-06: 5 mg via ORAL
  Filled 2024-03-06: qty 1

## 2024-03-06 NOTE — Progress Notes (Signed)
 Physical Therapy Treatment Patient Details Name: Seth [name] MRN: [id number] DOB: December 04, 1955 Today's Date: 03/06/2024   History of Present Illness Pt is 68 yo male admitted on 03/03/24 for L TKA.  Pt developed afib/aflutter with RVR during sx - cardiology consulted.  Pt with hx including but not limited to OSA, orthostatic hypotension, TIA, seizure, obesity, anxiety and depression, HTN, spinal cord stimulator    PT Comments  Pt very cooperative and with noted improvement from yesterday but continues pain limited and fatigues easily.  This am pt requiring significant assist to exit bed but reports he will initially sleep in recliner.  Pt performed therex program with assist and up to ambulate limited distance in hall.    If plan is discharge home, recommend the following: Assistance with cooking/housework;Help with stairs or ramp for entrance;A lot of help with walking and/or transfers;A lot of help with bathing/dressing/bathroom   Can travel by private vehicle        Equipment Recommendations  None recommended by PT    Recommendations for Other Services       Precautions / Restrictions Precautions Precautions: Fall;Knee Restrictions Weight Bearing Restrictions Per Provider Order: No LLE Weight Bearing Per Provider Order: Weight bearing as tolerated     Mobility  Bed Mobility Overal bed mobility: Needs Assistance Bed Mobility: Supine to Sit     Supine to sit: Mod assist, HOB elevated, +2 for physical assistance, +2 for safety/equipment Sit to supine: Used rails, Mod assist, +2 for physical assistance   General bed mobility comments: increased time, use of rails, assist to manage L LE, to control trunk and to complete rotation using bed pad - pt states will be staying in recliner initially at home    Transfers Overall transfer level: Needs assistance Equipment used: Rolling walker (2 wheels) Transfers: Sit to/from Stand Sit to Stand: Mod assist, +2 physical assistance            General transfer comment: cues for LE management and use of UEs to self assist.  Physical assist to bring wt up and fwd and to balance in standing with RW    Ambulation/Gait Ambulation/Gait assistance: Min assist, +2 safety/equipment Gait Distance (Feet): 22 Feet Assistive device: Rolling walker (2 wheels) Gait Pattern/deviations: Step-to pattern, Decreased step length - right, Decreased step length - left, Shuffle, Trunk flexed Gait velocity: decreased     General Gait Details: Increased time, cues for sequence, multiple standing rest breaks 2* c/o UE fatigue; distance ltd by c/o fatigue and dizziness - BP 122/82   Stairs             Wheelchair Mobility     Tilt Bed    Modified Rankin (Stroke Patients Only)       Balance Overall balance assessment: Needs assistance Sitting-balance support: No upper extremity supported Sitting balance-Leahy Scale: Good     Standing balance support: Bilateral upper extremity supported, Reliant on assistive device for balance Standing balance-Leahy Scale: Poor                              Communication Communication Communication: No apparent difficulties  Cognition Arousal: Alert Behavior During Therapy: WFL for tasks assessed/performed   PT - Cognitive impairments: No apparent impairments                         Following commands: Intact      Cueing Cueing Techniques: Verbal cues  Exercises Total Joint Exercises Ankle Circles/Pumps: AROM, Both, 10 reps, Supine Quad Sets: AROM, Both, 10 reps, Supine Heel Slides: AAROM, Left, Supine, Limitations, 15 reps Heel Slides Limitations: limited ROM Straight Leg Raises: AAROM, Left, 10 reps, Supine Goniometric ROM: AAROM L knee -5 - 30 - pain limited    General Comments General comments (skin integrity, edema, etc.): HR max at 124      Pertinent Vitals/Pain Pain Assessment Pain Assessment: 0-10 Pain Score: 6  Pain Location: L knee Pain  Descriptors / Indicators: Aching, Sore, Grimacing Pain Intervention(s): Limited activity within patient's tolerance, Monitored during session, Premedicated before session, Ice applied    Home Living                          Prior Function            PT Goals (current goals can now be found in the care plan section) Acute Rehab PT Goals Patient Stated Goal: return home PT Goal Formulation: With patient Time For Goal Achievement: 03/18/24 Potential to Achieve Goals: Good Progress towards PT goals: Progressing toward goals    Frequency    7X/week      PT Plan      Co-evaluation              AM-PAC PT "6 Clicks" Mobility   Outcome Measure  Help needed turning from your back to your side while in a flat bed without using bedrails?: A Lot Help needed moving from lying on your back to sitting on the side of a flat bed without using bedrails?: A Lot Help needed moving to and from a bed to a chair (including a wheelchair)?: A Lot Help needed standing up from a chair using your arms (e.g., wheelchair or bedside chair)?: A Lot Help needed to walk in hospital room?: A Little Help needed climbing 3-5 steps with a railing? : A Lot 6 Click Score: 13    End of Session Equipment Utilized During Treatment: Gait belt Activity Tolerance: Patient limited by fatigue;Patient limited by pain Patient left: in chair;with call bell/phone within reach;with chair alarm set Nurse Communication: Mobility status PT Visit Diagnosis: Other abnormalities of gait and mobility (R26.89);Muscle weakness (generalized) (M62.81)     Time: 1308-6578 PT Time Calculation (min) (ACUTE ONLY): 28 min  Charges:    $Gait Training: 8-22 mins $Therapeutic Exercise: 8-22 mins PT General Charges $$ ACUTE PT VISIT: 1 Visit                     Thedora Finlay PT Acute Rehabilitation Services Pager [phone] Office [phone]    Neng Albee 03/06/2024, 12:47 PM

## 2024-03-06 NOTE — Progress Notes (Signed)
Discharge package printed and instructions given to pt. Pt verbalizes understanding. 

## 2024-03-06 NOTE — Progress Notes (Signed)
 Physical Therapy Treatment Patient Details Name: Seth [name] MRN: [id number] DOB: 1955-12-23 Today's Date: 03/06/2024   History of Present Illness Pt is 68 yo male admitted on 03/03/24 for L TKA.  Pt developed afib/aflutter with RVR during sx - cardiology consulted.  Pt with hx including but not limited to OSA, orthostatic hypotension, TIA, seizure, obesity, anxiety and depression, HTN, spinal cord stimulator    PT Comments  Pt continues cooperative and with marked improvement in performance of most tasks.  Pt able to stand from recliner with CGA and ambulated into hallway to negotiate stairs and return to room and to bed.  Pts largest struggle is enter/exit bed but plans to sleep in recliner initially.  Pt stated feels comfortable with abilities at this point to manage at home and reports HHPT to follow him tomorrow.    If plan is discharge home, recommend the following: Assistance with cooking/housework;Help with stairs or ramp for entrance;A lot of help with walking and/or transfers;A lot of help with bathing/dressing/bathroom   Can travel by private vehicle        Equipment Recommendations  None recommended by PT    Recommendations for Other Services       Precautions / Restrictions Precautions Precautions: Fall;Knee Restrictions Weight Bearing Restrictions Per Provider Order: No LLE Weight Bearing Per Provider Order: Weight bearing as tolerated     Mobility  Bed Mobility Overal bed mobility: Needs Assistance Bed Mobility: Sit to Supine     Supine to sit: Mod assist, HOB elevated, +2 for physical assistance, +2 for safety/equipment Sit to supine: Mod assist   General bed mobility comments: increased time, use of rails, assist to manage L LE, to control trunk and to complete rotation using bed pad - pt states will be staying in recliner initially at home    Transfers Overall transfer level: Needs assistance Equipment used: Rolling walker (2 wheels) Transfers: Sit  to/from Stand Sit to Stand: Contact guard assist           General transfer comment: cues for LE management and use of UEs to self assist.  Very min assist on intial attempt but on second attempt CGA for safety only    Ambulation/Gait Ambulation/Gait assistance: Contact guard assist Gait Distance (Feet): 40 Feet Assistive device: Rolling walker (2 wheels) Gait Pattern/deviations: Step-to pattern, Decreased step length - right, Decreased step length - left, Shuffle, Trunk flexed Gait velocity: decreased     General Gait Details: Increased time, cues for sequence, multiple standing rest breaks 2* c/o UE fatigue; distance ltd by c/o fatigue   Stairs Stairs: Yes Stairs assistance: Min assist Stair Management: Two rails, Forwards, Step to pattern Number of Stairs: 3 General stair comments: Steady assist with cues for sequence   Wheelchair Mobility     Tilt Bed    Modified Rankin (Stroke Patients Only)       Balance Overall balance assessment: Needs assistance Sitting-balance support: No upper extremity supported Sitting balance-Leahy Scale: Good     Standing balance support: Single extremity supported Standing balance-Leahy Scale: Fair                              Hotel manager: No apparent difficulties  Cognition Arousal: Alert Behavior During Therapy: WFL for tasks assessed/performed   PT - Cognitive impairments: No apparent impairments  Following commands: Intact      Cueing Cueing Techniques: Verbal cues  Exercises Total Joint Exercises Ankle Circles/Pumps: AROM, Both, 10 reps, Supine Quad Sets: AROM, Both, 10 reps, Supine Heel Slides: AAROM, Left, Supine, Limitations, 15 reps Heel Slides Limitations: limited ROM Straight Leg Raises: AAROM, Left, 10 reps, Supine Goniometric ROM: AAROM L knee -5 - 30 - pain limited    General Comments General comments (skin integrity, edema,  etc.): HR max at 124      Pertinent Vitals/Pain Pain Assessment Pain Assessment: 0-10 Pain Score: 6  Pain Location: L knee Pain Descriptors / Indicators: Aching, Sore, Grimacing Pain Intervention(s): Limited activity within patient's tolerance, Monitored during session, Premedicated before session, Ice applied    Home Living                          Prior Function            PT Goals (current goals can now be found in the care plan section) Acute Rehab PT Goals Patient Stated Goal: return home PT Goal Formulation: With patient Time For Goal Achievement: 03/18/24 Potential to Achieve Goals: Good Progress towards PT goals: Progressing toward goals    Frequency    7X/week      PT Plan      Co-evaluation              AM-PAC PT "6 Clicks" Mobility   Outcome Measure  Help needed turning from your back to your side while in a flat bed without using bedrails?: A Lot Help needed moving from lying on your back to sitting on the side of a flat bed without using bedrails?: A Lot Help needed moving to and from a bed to a chair (including a wheelchair)?: A Lot Help needed standing up from a chair using your arms (e.g., wheelchair or bedside chair)?: A Little Help needed to walk in hospital room?: A Little Help needed climbing 3-5 steps with a railing? : A Little 6 Click Score: 15    End of Session Equipment Utilized During Treatment: Gait belt Activity Tolerance: Patient tolerated treatment well Patient left: in bed;with call bell/phone within reach;with bed alarm set Nurse Communication: Mobility status PT Visit Diagnosis: Other abnormalities of gait and mobility (R26.89);Muscle weakness (generalized) (M62.81)     Time: 1610-9604 PT Time Calculation (min) (ACUTE ONLY): 26 min  Charges:    $Gait Training: 8-22 mins $Therapeutic Exercise: 8-22 mins $Therapeutic Activity: 8-22 mins PT General Charges $$ ACUTE PT VISIT: 1 Visit                      Thedora Finlay PT Acute Rehabilitation Services Pager [phone] Office [phone]    Kiyani Jernigan 03/06/2024, 12:55 PM

## 2024-03-06 NOTE — Discharge Summary (Signed)
 Physician Discharge Summary  Patient ID: Seth [name] MRN: [id number] DOB/AGE: 70-Aug-1957 70 y.o.  Admit date: 03/03/2024 Discharge date: 03/06/2024  Admission Diagnoses:  Primary osteoarthritis of left knee  Discharge Diagnoses:  Principal Problem:   Primary osteoarthritis of left knee   Past Medical History:  Diagnosis Date   Anxiety    Brain bleed [facility])    Intracerebral bleed - [employer] 2013   Bronchitis    Depression    Diabetes mellitus without complication (HCC)    History of kidney stones    Hypertension    Osteoarthritis of both knees    Pre-diabetes    Sleep apnea    dx. at one time, then they said no- mild, no cpap   Transient ischemic attack (TIA)     Surgeries: Procedure(s): ARTHROPLASTY, KNEE, TOTAL on 03/03/2024   Consultants (if any):   Discharged Condition: Improved  Hospital Course: AURTHER HARLIN is an 68 y.o. male who was admitted 03/03/2024 with a diagnosis of Primary osteoarthritis of left knee and went to the operating room on 03/03/2024 and underwent the above named procedures.  Post-operative development of paroxysmal atrial fibrillation/atrial flutter.  No prior history of such.  Cardiology consulted, see separate note.  Patient started on eliquis at therapeutic dose per protocol, and arrythmia pharmacologically managed.  Patient remained asymptomatic.  Echo performed.  Close follow up with cardiology set up for outpatient.  He was given perioperative antibiotics:  Anti-infectives (From admission, onward)    Start     Dose/Rate Route Frequency Ordered Stop   03/04/24 0200  vancomycin  (VANCOCIN ) IVPB 1000 mg/200 mL premix        1,000 mg 200 mL/hr over 60 Minutes Intravenous  Once 03/03/24 1931 03/04/24 0700   03/03/24 1900  ceFAZolin (ANCEF) IVPB 2g/100 mL premix        2 g 200 mL/hr over 30 Minutes Intravenous Every 6 hours 03/03/24 1703 03/04/24 0700   03/03/24 1145  ceFAZolin (ANCEF) IVPB 3g/150 mL premix        3 g 300 mL/hr over 30  Minutes Intravenous  Once 03/03/24 1144 03/03/24 1330   03/03/24 1130  vancomycin  (VANCOREADY) IVPB 2000 mg/400 mL        2,000 mg 200 mL/hr over 120 Minutes Intravenous  Once 03/03/24 1124 03/03/24 1628   03/03/24 1130  ceFAZolin (ANCEF) IVPB 2g/100 mL premix  Status:  Discontinued        2 g 200 mL/hr over 30 Minutes Intravenous On call to O.R. 03/03/24 1124 03/03/24 1144     .  He was given sequential compression devices, early ambulation, and Eliquis 5 mg BID for DVT prophylaxis.  He benefited maximally from the hospital stay and there were no complications.    Recent vital signs:  Vitals:   03/05/24 1841 03/05/24 2211  BP: 129/82 135/88  Pulse: 98 100  Resp: 16 17  Temp: 99.3 F (37.4 C) (!) 97.5 F (36.4 C)  SpO2: 96% 98%    Recent laboratory studies:  Lab Results  Component Value Date   HGB 13.1 03/05/2024   HGB 13.4 03/04/2024   HGB 14.4 02/22/2024   Lab Results  Component Value Date   WBC 10.3 03/05/2024   PLT 249 03/05/2024   No results found for: "INR" Lab Results  Component Value Date   NA 134 (L) 03/04/2024   K 4.1 03/04/2024   CL 105 03/04/2024   CO2 25 03/04/2024   BUN 17 03/04/2024   CREATININE 0.93  03/04/2024   GLUCOSE 88 03/04/2024    Discharge Medications:   Allergies as of 03/06/2024       Reactions   Latex Other (See Comments)   Sulfa Antibiotics Other (See Comments)   Amoxicillin-pot Clavulanate Diarrhea, Nausea Only   Tape Rash        Medication List     STOP taking these medications    aspirin EC 81 MG tablet   oxyCODONE-acetaminophen  10-325 MG tablet Commonly known as: PERCOCET       TAKE these medications    acetaminophen  500 MG tablet Commonly known as: TYLENOL  Take 2 tablets (1,000 mg total) by mouth every 8 (eight) hours as needed.   albuterol 108 (90 Base) MCG/ACT inhaler Commonly known as: VENTOLIN HFA Inhale 1-2 puffs into the lungs every 6 (six) hours as needed for wheezing or shortness of breath.    ALPRAZolam 1 MG tablet Commonly known as: XANAX Take 1 mg by mouth 3 (three) times daily as needed for anxiety.   amiodarone 200 MG tablet Commonly known as: PACERONE Take 2 tablets (400 mg total) by mouth 2 (two) times daily for 2 days, THEN 1 tablet (200 mg total) 2 (two) times daily for 7 days, THEN 1 tablet (200 mg total) daily for 7 days. Start taking on: March 06, 2024   apixaban 5 MG Tabs tablet Commonly known as: Eliquis Take 1 tablet (5 mg total) by mouth 2 (two) times daily.   atorvastatin 10 MG tablet Commonly known as: LIPITOR Take 10 mg by mouth every evening.   celecoxib 100 MG capsule Commonly known as: CeleBREX Take 1 capsule (100 mg total) by mouth 2 (two) times daily for 14 days.   cetirizine 10 MG tablet Commonly known as: ZYRTEC Take 10 mg by mouth at bedtime.   citalopram 40 MG tablet Commonly known as: CELEXA Take 40 mg by mouth every evening.   diclofenac Sodium 1 % Gel Commonly known as: VOLTAREN Apply 2 g topically 4 (four) times daily as needed (pain).   gabapentin 300 MG capsule Commonly known as: NEURONTIN Take 1 capsule (300 mg total) by mouth every 8 (eight) hours as needed (nerve pain).   Gemtesa 75 MG Tabs Generic drug: Vibegron Take 75 mg by mouth every evening.   levETIRAcetam  500 MG 24 hr tablet Commonly known as: KEPPRA  XR Take 2 tablets every night   loratadine 10 MG tablet Commonly known as: CLARITIN Take 10 mg by mouth daily.   methocarbamol 500 MG tablet Commonly known as: ROBAXIN Take 1 tablet (500 mg total) by mouth every 8 (eight) hours as needed for up to 10 days for muscle spasms.   montelukast 10 MG tablet Commonly known as: SINGULAIR Take 10 mg by mouth at bedtime.   Mounjaro  12.5 MG/0.5ML Pen Generic drug: tirzepatide  Inject 12.5 mg into the skin once a week. What changed: Another medication with the same name was changed. Make sure you understand how and when to take each.   Mounjaro  15 MG/0.5ML  Pen Generic drug: tirzepatide  Inject 15 mg into the skin once a week. What changed: when to take this   omeprazole 40 MG capsule Commonly known as: PRILOSEC Take 1 capsule (40 mg total) by mouth daily for 21 days.   ondansetron  4 MG tablet Commonly known as: Zofran  Take 1 tablet (4 mg total) by mouth every 8 (eight) hours as needed for up to 14 days for nausea or vomiting.   oxyCODONE 5 MG immediate release tablet Commonly known  as: Roxicodone Take 1 tablet (5 mg total) by mouth every 4 (four) hours as needed for up to 7 days for severe pain (pain score 7-10) or moderate pain (pain score 4-6).   polyethylene glycol 17 g packet Commonly known as: MiraLax Take 17 g by mouth daily.   Systane 0.4-0.3 % Soln Generic drug: Polyethyl Glycol-Propyl Glycol Place 1-2 drops into both eyes daily as needed (dry/irritated eyes).   tamsulosin 0.4 MG Caps capsule Commonly known as: FLOMAX Take 0.4 mg by mouth in the morning and at bedtime.   triamcinolone  cream 0.5 % Commonly known as: KENALOG Apply 1 application  topically daily as needed (ear irritation).        Diagnostic Studies: ECHOCARDIOGRAM COMPLETE Result Date: 03/05/2024    ECHOCARDIOGRAM REPORT   Patient Name:   KENNARD FILDES Date of Exam: 03/05/2024 Medical Rec #:  [id number]      Height:       72.0 in Accession #:    [id number]     Weight:       281.0 lb Date of Birth:  1955/11/02      BSA:          2.462 m Patient Age:    68 years       BP:           123/74 mmHg Patient Gender: M              HR:           95 bpm. Exam Location:  Inpatient Procedure: 2D Echo, Color Doppler and Cardiac Doppler (Both Spectral and Color            Flow Doppler were utilized during procedure). Indications:    A-fib  History:        Patient has no prior history of Echocardiogram examinations.                 TIA; Risk Factors:Hypertension.  Sonographer:    Jeralene Mom Referring Phys: [id number] TAYLOR A PARCELLS IMPRESSIONS  1. Left ventricular  ejection fraction, by estimation, is 55 to 60%. The left ventricle has normal function. The left ventricle has no regional wall motion abnormalities. There is mild concentric left ventricular hypertrophy. Left ventricular diastolic function could not be evaluated.  2. Right ventricular systolic function is normal. The right ventricular size is normal. There is normal pulmonary artery systolic pressure.  3. Left atrial size was mild to moderately dilated.  4. The mitral valve is normal in structure. No evidence of mitral valve regurgitation. No evidence of mitral stenosis.  5. The aortic valve is normal in structure. Aortic valve regurgitation is not visualized. No aortic stenosis is present.  6. Aortic dilatation noted. There is mild dilatation of the aortic root, measuring 40 mm. There is mild dilatation of the ascending aorta, measuring 41 mm.  7. The inferior vena cava is normal in size with greater than 50% respiratory variability, suggesting right atrial pressure of 3 mmHg. FINDINGS  Left Ventricle: Left ventricular ejection fraction, by estimation, is 55 to 60%. The left ventricle has normal function. The left ventricle has no regional wall motion abnormalities. The left ventricular internal cavity size was normal in size. There is  mild concentric left ventricular hypertrophy. Left ventricular diastolic function could not be evaluated due to atrial fibrillation. Left ventricular diastolic function could not be evaluated. Right Ventricle: The right ventricular size is normal. No increase in right ventricular wall thickness. Right ventricular systolic function is  normal. There is normal pulmonary artery systolic pressure. The tricuspid regurgitant velocity is 2.37 m/s, and  with an assumed right atrial pressure of 3 mmHg, the estimated right ventricular systolic pressure is 25.5 mmHg. Left Atrium: Left atrial size was mild to moderately dilated. Right Atrium: Right atrial size was normal in size. Pericardium:  There is no evidence of pericardial effusion. Mitral Valve: The mitral valve is normal in structure. No evidence of mitral valve regurgitation. No evidence of mitral valve stenosis. Tricuspid Valve: The tricuspid valve is normal in structure. Tricuspid valve regurgitation is trivial. No evidence of tricuspid stenosis. Aortic Valve: The aortic valve is normal in structure. Aortic valve regurgitation is not visualized. No aortic stenosis is present. Aortic valve mean gradient measures 3.0 mmHg. Aortic valve peak gradient measures 4.8 mmHg. Aortic valve area, by VTI measures 3.62 cm. Pulmonic Valve: The pulmonic valve was normal in structure. Pulmonic valve regurgitation is not visualized. No evidence of pulmonic stenosis. Aorta: Aortic dilatation noted. There is mild dilatation of the aortic root, measuring 40 mm. There is mild dilatation of the ascending aorta, measuring 41 mm. Venous: The inferior vena cava is normal in size with greater than 50% respiratory variability, suggesting right atrial pressure of 3 mmHg. IAS/Shunts: No atrial level shunt detected by color flow Doppler.  LEFT VENTRICLE PLAX 2D LVIDd:         5.30 cm   Diastology LVIDs:         3.60 cm   LV e' medial:    7.40 cm/s LV PW:         1.20 cm   LV E/e' medial:  14.6 LV IVS:        1.20 cm   LV e' lateral:   7.94 cm/s LVOT diam:     2.30 cm   LV E/e' lateral: 13.6 LV SV:         61 LV SV Index:   25 LVOT Area:     4.15 cm  RIGHT VENTRICLE RV Basal diam:  4.40 cm RV Mid diam:    3.90 cm RV S prime:     13.30 cm/s TAPSE (M-mode): 1.3 cm LEFT ATRIUM             Index        RIGHT ATRIUM           Index LA diam:        4.15 cm 1.69 cm/m   RA Area:     18.70 cm LA Vol (A2C):   68.9 ml 27.99 ml/m  RA Volume:   47.40 ml  19.25 ml/m LA Vol (A4C):   70.0 ml 28.43 ml/m LA Biplane Vol: 70.2 ml 28.52 ml/m  AORTIC VALVE AV Area (Vmax):    3.41 cm AV Area (Vmean):   3.55 cm AV Area (VTI):     3.62 cm AV Vmax:           109.00 cm/s AV Vmean:           71.700 cm/s AV VTI:            0.170 m AV Peak Grad:      4.8 mmHg AV Mean Grad:      3.0 mmHg LVOT Vmax:         89.50 cm/s LVOT Vmean:        61.300 cm/s LVOT VTI:          0.148 m LVOT/AV VTI ratio: 0.87  AORTA Ao Root diam: 4.00  cm MITRAL VALVE                TRICUSPID VALVE MV Area (PHT): 3.16 cm     TR Peak grad:   22.5 mmHg MV Decel Time: 240 msec     TR Vmax:        237.00 cm/s MR Peak grad: 40.2 mmHg MR Vmax:      317.00 cm/s   SHUNTS MV E velocity: 108.00 cm/s  Systemic VTI:  0.15 m MV A velocity: 53.60 cm/s   Systemic Diam: 2.30 cm MV E/A ratio:  2.01 Jules Oar MD Electronically signed by Jules Oar MD Signature Date/Time: 03/05/2024/7:55:18 PM    Final    DG Knee Left Port Result Date: 03/03/2024 CLINICAL DATA:  Postop. EXAM: PORTABLE LEFT KNEE - 1-2 VIEW COMPARISON:  None Available. FINDINGS: Left knee arthroplasty in expected alignment. No periprosthetic lucency or fracture. Recent postsurgical change includes air and edema in the soft tissues and joint space. IMPRESSION: Left knee arthroplasty without immediate postoperative complication. Electronically Signed   By: Chadwick Colonel M.D.   On: 03/03/2024 16:32    Disposition: Discharge disposition: 01-Home or Self Care       Discharge Instructions     Call MD / Call 911   Complete by: As directed    If you experience chest pain or shortness of breath, CALL 911 and be transported to the hospital emergency room.  If you develope a fever above 101 F, pus (white drainage) or increased drainage or redness at the wound, or calf pain, call your surgeon's office.   Constipation Prevention   Complete by: As directed    Drink plenty of fluids.  Prune juice may be helpful.  You may use a stool softener, such as Colace (over the counter) 100 mg twice a day.  Use MiraLax (over the counter) for constipation as needed.   Diet - low sodium heart healthy   Complete by: As directed    Do not put a pillow under the knee. Place it under  the heel.   Complete by: As directed    Driving restrictions   Complete by: As directed    No driving for 2-4 weeks   Increase activity slowly as tolerated   Complete by: As directed    Post-operative opioid taper instructions:   Complete by: As directed    POST-OPERATIVE OPIOID TAPER INSTRUCTIONS: It is important to wean off of your opioid medication as soon as possible. If you do not need pain medication after your surgery it is ok to stop day one. Opioids include: Codeine, Hydrocodone(Norco, Vicodin), Oxycodone(Percocet, oxycontin) and hydromorphone amongst others.  Long term and even short term use of opiods can cause: Increased pain response Dependence Constipation Depression Respiratory depression And more.  Withdrawal symptoms can include Flu like symptoms Nausea, vomiting And more Techniques to manage these symptoms Hydrate well Eat regular healthy meals Stay active Use relaxation techniques(deep breathing, meditating, yoga) Do Not substitute Alcohol to help with tapering If you have been on opioids for less than two weeks and do not have pain than it is ok to stop all together.  Plan to wean off of opioids This plan should start within one week post op of your joint replacement. Maintain the same interval or time between taking each dose and first decrease the dose.  Cut the total daily intake of opioids by one tablet each day Next start to increase the time between doses. The last dose that should  be eliminated is the evening dose.      TED hose   Complete by: As directed    Use stockings (TED hose) for 2 weeks on both leg(s).  Then for 2 more weeks on the surgical leg.  You may remove them at night for sleeping.        Follow-up Information     Murleen Arms, MD Follow up.   Specialty: Orthopedic Surgery Why: Your appointment is scheduled for 2:45. Contact information: [address] [place] [address] [phone]          [facility] Follow up.   Why: HHPT will provide 6 home visits prior to starting outpatient physical therapy        [facility]- [facility]. Go on 04/01/2024.   Why: your outpatient physical therapy has been scheduled for 11:00. Contact informationAntoinette Kirschner  [place], [place] [address]  [phone]                   Discharge Instructions      INSTRUCTIONS AFTER JOINT REPLACEMENT   Remove items at home which could result in a fall. This includes throw rugs or furniture in walking pathways ICE to the affected joint every three hours while awake for 30 minutes at a time, for at least the first 3-5 days, and then as needed for pain and swelling.  Continue to use ice for pain and swelling. You may notice swelling that will progress down to the foot and ankle.  This is normal after surgery.  Elevate your leg when you are not up walking on it.   Continue to use the breathing machine you got in the hospital (incentive spirometer) which will help keep your temperature down.  It is common for your temperature to cycle up and down following surgery, especially at night when you are not up moving around and exerting yourself.  The breathing machine keeps your lungs expanded and your temperature down.  DIET:  As you were doing prior to hospitalization, we recommend a well-balanced diet.  DRESSING / WOUND CARE / SHOWERING:  Keep the surgical dressing until follow up.  The dressing is water proof, so you can shower without any extra covering.  IF THE DRESSING FALLS OFF or the wound gets wet inside, change the dressing with sterile gauze.  Please use good hand washing techniques before changing the dressing.  Do not use any lotions or creams on the incision until instructed by your surgeon.    ACTIVITY  Increase activity slowly as tolerated, but follow the weight bearing instructions below.   No driving for 6 weeks or until further direction given by your physician.  You cannot drive while  taking narcotics.  No lifting or carrying greater than 10 lbs. until further directed by your surgeon. Avoid periods of inactivity such as sitting longer than an hour when not asleep. This helps prevent blood clots.  You may return to work once you are authorized by your doctor.   WEIGHT BEARING: Weight bearing as tolerated with assist device (walker, cane, etc) as directed, use it as long as suggested by your surgeon or therapist, typically at least 4-6 weeks.  EXERCISES  Results after joint replacement surgery are often greatly improved when you follow the exercise, range of motion and muscle strengthening exercises prescribed by your doctor. Safety measures are also important to protect the joint from further injury. Any time any of these exercises cause you to have increased  pain or swelling, decrease what you are doing until you are comfortable again and then slowly increase them. If you have problems or questions, call your caregiver or physical therapist for advice.   Rehabilitation is important following a joint replacement. After just a few days of immobilization, the muscles of the leg can become weakened and shrink (atrophy).  These exercises are designed to build up the tone and strength of the thigh and leg muscles and to improve motion. Often times heat used for twenty to thirty minutes before working out will loosen up your tissues and help with improving the range of motion but do not use heat for the first two weeks following surgery (sometimes heat can increase post-operative swelling).   These exercises can be done on a training (exercise) mat, on the floor, on a table or on a bed. Use whatever works the best and is most comfortable for you.    Use music or television while you are exercising so that the exercises are a pleasant break in your day. This will make your life better with the exercises acting as a break in your routine that you can look forward to.   Perform all exercises  about fifteen times, three times per day or as directed.  You should exercise both the operative leg and the other leg as well.  Exercises include:   Quad Sets - Tighten up the muscle on the front of the thigh (Quad) and hold for 5-10 seconds.   Straight Leg Raises - With your knee straight (if you were given a brace, keep it on), lift the leg to 60 degrees, hold for 3 seconds, and slowly lower the leg.  Perform this exercise against resistance later as your leg gets stronger.  Leg Slides: Lying on your back, slowly slide your foot toward your buttocks, bending your knee up off the floor (only go as far as is comfortable). Then slowly slide your foot back down until your leg is flat on the floor again.  Angel Wings: Lying on your back spread your legs to the side as far apart as you can without causing discomfort.  Hamstring Strength:  Lying on your back, push your heel against the floor with your leg straight by tightening up the muscles of your buttocks.  Repeat, but this time bend your knee to a comfortable angle, and push your heel against the floor.  You may put a pillow under the heel to make it more comfortable if necessary.   A rehabilitation program following joint replacement surgery can speed recovery and prevent re-injury in the future due to weakened muscles. Contact your doctor or a physical therapist for more information on knee rehabilitation.   CONSTIPATION:  Constipation is defined medically as fewer than three stools per week and severe constipation as less than one stool per week.  Even if you have a regular bowel pattern at home, your normal regimen is likely to be disrupted due to multiple reasons following surgery.  Combination of anesthesia, postoperative narcotics, change in appetite and fluid intake all can affect your bowels.   YOU MUST use at least one of the following options; they are listed in order of increasing strength to get the job done.  They are all available over  the counter, and you may need to use some, POSSIBLY even all of these options:    Drink plenty of fluids (prune juice may be helpful) and high fiber foods Colace 100 mg by mouth twice a  day  Senokot for constipation as directed and as needed Dulcolax (bisacodyl), take with full glass of water  Miralax (polyethylene glycol) once or twice a day as needed.  If you have tried all these things and are unable to have a bowel movement in the first 3-4 days after surgery call either your surgeon or your primary doctor.    If you experience loose stools or diarrhea, hold the medications until you stool forms back up.  If your symptoms do not get better within 1 week or if they get worse, check with your doctor.  If you experience "the worst abdominal pain ever" or develop nausea or vomiting, please contact the office immediately for further recommendations for treatment.  ITCHING:  If you experience itching with your medications, try taking only a single pain pill, or even half a pain pill at a time.  You can also use Benadryl  over the counter for itching or also to help with sleep.   TED HOSE STOCKINGS:  Use stockings on both legs until for at least 2 weeks or as directed by physician office. They may be removed at night for sleeping.  MEDICATIONS:  See your medication summary on the "After Visit Summary" that nursing will review with you.  You may have some home medications which will be placed on hold until you complete the course of blood thinner medication.  It is important for you to complete the blood thinner medication as prescribed.  Blood clot prevention (DVT Prophylaxis): After surgery you are at an increased risk for a blood clot.  You were prescribed a blood thinner, Aspirin 81mg , to be taken twice daily for a total of 4 weeks from surgery to help reduce your risk of getting a blood clot.  Signs of a pulmonary embolus (blood clot in the lungs) include sudden short of breath, feeling lightheaded  or dizzy, chest pain with a deep breath, rapid pulse rapid breathing.  Signs of a blood clot in your arms or legs include new unexplained swelling and cramping, warm, red or darkened skin around the painful area.  Please call the office or 911 right away if these signs or symptoms develop.  PRECAUTIONS:   If you experience chest pain or shortness of breath - call 911 immediately for transfer to the hospital emergency department.   If you develop a fever greater that 101 F, purulent drainage from wound, increased redness or drainage from wound, foul odor from the wound/dressing, or calf pain - CONTACT YOUR SURGEON.                                                   FOLLOW-UP APPOINTMENTS:  If you do not already have a post-op appointment, please call the office for an appointment to be seen by your surgeon.  Guidelines for how soon to be seen are listed in your "After Visit Summary", but are typically between 2-3 weeks after surgery.  If you have a specialized bandage, you may be told to follow up 1 week after surgery.  OTHER INSTRUCTIONS:  Knee Replacement:  Do not place pillow under knee, focus on keeping the knee straight while resting.  Place foam block, curve side up under heel at all times except when walking.  DO NOT modify, tear, cut, or change the foam block in any way.  POST-OPERATIVE OPIOID TAPER  INSTRUCTIONS: It is important to wean off of your opioid medication as soon as possible. If you do not need pain medication after your surgery it is ok to stop day one. Opioids include: Codeine, Hydrocodone(Norco, Vicodin), Oxycodone(Percocet, oxycontin) and hydromorphone amongst others.  Long term and even short term use of opiods can cause: Increased pain response Dependence Constipation Depression Respiratory depression And more.  Withdrawal symptoms can include Flu like symptoms Nausea, vomiting And more Techniques to manage these symptoms Hydrate well Eat regular healthy meals Stay  active Use relaxation techniques(deep breathing, meditating, yoga) Do Not substitute Alcohol to help with tapering If you have been on opioids for less than two weeks and do not have pain than it is ok to stop all together.  Plan to wean off of opioids This plan should start within one week post op of your joint replacement. Maintain the same interval or time between taking each dose and first decrease the dose.  Cut the total daily intake of opioids by one tablet each day Next start to increase the time between doses. The last dose that should be eliminated is the evening dose.   MAKE SURE YOU:  Understand these instructions.  Get help right away if you are not doing well or get worse.    Thank you for letting us  be a part of your medical care team.  It is a privilege we respect greatly.  We hope these instructions will help you stay on track for a fast and full recovery!           Signed: Albertus Alt 03/06/2024, 8:12 AM

## 2024-03-06 NOTE — Progress Notes (Signed)
     Subjective:  Patient reports pain as mild, better than yesterday.  Denies any chest pain or shortness of breath.   Mobilized some with PT yesterday.  Poor sleep due to hospital setting, though no events overnight.   Echo by cardiology yesterday, see separate note.  Denies numbness or tingling.  Eager to go home today.  No other complaints at this time.   Objective:   VITALS:   Vitals:   03/05/24 0547 03/05/24 1226 03/05/24 1841 03/05/24 2211  BP: 123/74 102/61 129/82 135/88  Pulse: 96 (!) 44 98 100  Resp: 18 17 16 17   Temp: 98.2 F (36.8 C) 97.8 F (36.6 C) 99.3 F (37.4 C) (!) 97.5 F (36.4 C)  TempSrc: Oral  Oral Oral  SpO2: 93% 97% 96% 98%  Weight:      Height:        AAOx4 siting comfortably, in NAD Sensation intact distally Intact pulses distally Dorsiflexion/Plantar flexion intact Incision: dressing C/D/I Compartment soft Wiggles toes appropriately    Lab Results  Component Value Date   WBC 10.3 03/05/2024   HGB 13.1 03/05/2024   HCT 41.7 03/05/2024   MCV 83.7 03/05/2024   PLT 249 03/05/2024   BMET    Component Value Date/Time   NA 134 (L) 03/04/2024 0217   K 4.1 03/04/2024 0217   CL 105 03/04/2024 0217   CO2 25 03/04/2024 0217   GLUCOSE 88 03/04/2024 0217   BUN 17 03/04/2024 0217   CREATININE 0.93 03/04/2024 0217   CALCIUM 8.4 (L) 03/04/2024 0217   GFRNONAA >60 03/04/2024 0217      Xray: Total knee arthroplasty components in good position no adverse features  Assessment/Plan: 3 Days Post-Op   Principal Problem:   Primary osteoarthritis of left knee  Status post left TKA 03/03/2024  Atrial flutter: Cardiology echo yesterday okay, see separate note, with anticipated discharge today or tomorrow and close outpatient f/u with cardiology.  Post op recs: WB: WBAT Abx: ancef + vanc given hx of MRSA Imaging: PACU xrays DVT prophylaxis: Eliquis 2.5 mg twice daily postop day 1 and 2, then Eliquis 5 mg twice daily starting postop day 3 for 28  days Follow up: 2 weeks after surgery for a wound check with Dr. Pryor Browning at [facility].  Address: [address], [place], [place] [address]  Office Phone: [phone]   Albertus Alt 03/06/2024, 6:30 AM     Contact information:   Weekdays 7am-5pm epic message Dr. Pryor Browning, or call office for patient follow up: [phone] After hours and holidays please check [email or web address] for group call information for [facility]

## 2024-03-06 NOTE — Plan of Care (Signed)
  Problem: Activity: Goal: Risk for activity intolerance will decrease Outcome: Progressing   Problem: Safety: Goal: Ability to remain free from injury will improve Outcome: Progressing   Problem: Pain Managment: Goal: General experience of comfort will improve and/or be controlled Outcome: Progressing

## 2024-03-06 NOTE — Progress Notes (Signed)
 Patient refused am vitals.

## 2024-03-07 DIAGNOSIS — I480 Paroxysmal atrial fibrillation: Secondary | ICD-10-CM | POA: Diagnosis not present

## 2024-03-07 DIAGNOSIS — I10 Essential (primary) hypertension: Secondary | ICD-10-CM | POA: Diagnosis not present

## 2024-03-07 DIAGNOSIS — Z471 Aftercare following joint replacement surgery: Secondary | ICD-10-CM | POA: Diagnosis not present

## 2024-03-07 DIAGNOSIS — F419 Anxiety disorder, unspecified: Secondary | ICD-10-CM | POA: Diagnosis not present

## 2024-03-07 DIAGNOSIS — E119 Type 2 diabetes mellitus without complications: Secondary | ICD-10-CM | POA: Diagnosis not present

## 2024-03-07 DIAGNOSIS — G4730 Sleep apnea, unspecified: Secondary | ICD-10-CM | POA: Diagnosis not present

## 2024-03-07 DIAGNOSIS — M1711 Unilateral primary osteoarthritis, right knee: Secondary | ICD-10-CM | POA: Diagnosis not present

## 2024-03-07 DIAGNOSIS — E785 Hyperlipidemia, unspecified: Secondary | ICD-10-CM | POA: Diagnosis not present

## 2024-03-07 DIAGNOSIS — F32A Depression, unspecified: Secondary | ICD-10-CM | POA: Diagnosis not present

## 2024-03-10 ENCOUNTER — Ambulatory Visit (HOSPITAL_COMMUNITY)

## 2024-03-10 DIAGNOSIS — G4730 Sleep apnea, unspecified: Secondary | ICD-10-CM | POA: Diagnosis not present

## 2024-03-10 DIAGNOSIS — F32A Depression, unspecified: Secondary | ICD-10-CM | POA: Diagnosis not present

## 2024-03-10 DIAGNOSIS — E785 Hyperlipidemia, unspecified: Secondary | ICD-10-CM | POA: Diagnosis not present

## 2024-03-10 DIAGNOSIS — I480 Paroxysmal atrial fibrillation: Secondary | ICD-10-CM | POA: Diagnosis not present

## 2024-03-10 DIAGNOSIS — I10 Essential (primary) hypertension: Secondary | ICD-10-CM | POA: Diagnosis not present

## 2024-03-10 DIAGNOSIS — M1711 Unilateral primary osteoarthritis, right knee: Secondary | ICD-10-CM | POA: Diagnosis not present

## 2024-03-10 DIAGNOSIS — F419 Anxiety disorder, unspecified: Secondary | ICD-10-CM | POA: Diagnosis not present

## 2024-03-10 DIAGNOSIS — Z471 Aftercare following joint replacement surgery: Secondary | ICD-10-CM | POA: Diagnosis not present

## 2024-03-10 DIAGNOSIS — E119 Type 2 diabetes mellitus without complications: Secondary | ICD-10-CM | POA: Diagnosis not present

## 2024-03-11 ENCOUNTER — Encounter: Payer: Self-pay | Admitting: Student

## 2024-03-11 ENCOUNTER — Ambulatory Visit: Attending: Student | Admitting: Student

## 2024-03-11 VITALS — BP 102/60 | HR 102 | Ht 72.0 in | Wt 282.0 lb

## 2024-03-11 DIAGNOSIS — I4891 Unspecified atrial fibrillation: Secondary | ICD-10-CM | POA: Diagnosis not present

## 2024-03-11 DIAGNOSIS — G4730 Sleep apnea, unspecified: Secondary | ICD-10-CM | POA: Diagnosis not present

## 2024-03-11 DIAGNOSIS — E785 Hyperlipidemia, unspecified: Secondary | ICD-10-CM | POA: Diagnosis not present

## 2024-03-11 DIAGNOSIS — M1711 Unilateral primary osteoarthritis, right knee: Secondary | ICD-10-CM | POA: Diagnosis not present

## 2024-03-11 DIAGNOSIS — F419 Anxiety disorder, unspecified: Secondary | ICD-10-CM | POA: Diagnosis not present

## 2024-03-11 DIAGNOSIS — I480 Paroxysmal atrial fibrillation: Secondary | ICD-10-CM | POA: Diagnosis not present

## 2024-03-11 DIAGNOSIS — I77810 Thoracic aortic ectasia: Secondary | ICD-10-CM | POA: Diagnosis not present

## 2024-03-11 DIAGNOSIS — I10 Essential (primary) hypertension: Secondary | ICD-10-CM | POA: Diagnosis not present

## 2024-03-11 DIAGNOSIS — F32A Depression, unspecified: Secondary | ICD-10-CM | POA: Diagnosis not present

## 2024-03-11 DIAGNOSIS — I951 Orthostatic hypotension: Secondary | ICD-10-CM

## 2024-03-11 DIAGNOSIS — E119 Type 2 diabetes mellitus without complications: Secondary | ICD-10-CM | POA: Diagnosis not present

## 2024-03-11 DIAGNOSIS — E782 Mixed hyperlipidemia: Secondary | ICD-10-CM

## 2024-03-11 DIAGNOSIS — Z471 Aftercare following joint replacement surgery: Secondary | ICD-10-CM | POA: Diagnosis not present

## 2024-03-11 DIAGNOSIS — I9789 Other postprocedural complications and disorders of the circulatory system, not elsewhere classified: Secondary | ICD-10-CM

## 2024-03-11 MED ORDER — AMIODARONE HCL 200 MG PO TABS: 200.0000 mg | ORAL_TABLET | Freq: Every day | ORAL | 2 refills | Status: DC

## 2024-03-11 MED ORDER — APIXABAN 5 MG PO TABS: 5.0000 mg | ORAL_TABLET | Freq: Two times a day (BID) | ORAL | 5 refills | Status: DC

## 2024-03-11 NOTE — Progress Notes (Signed)
 Cardiology Office Note    Date:  03/11/2024  ID:  JAI STEIL, DOB 01-31-1956, MRN [id number] Cardiologist: Lasalle Pointer, MD    History of Present Illness:    Seth [name] is a 68 y.o. male with past medical history of Type II DM, HLD, orthostatic hypotension, history of TIA and OSA (previously not wanting to use CPAP) who presents to the office today for hospital follow-up.  He was recently admitted to [facility] on 03/03/2024 for left total knee arthroplasty. The night following his surgery, he was noted to be in atrial fibrillation/flutter with RVR. Heart rate had been in the 110's to 120's and he denied any associated symptoms at that time. BP did not allow for beta-blocker or calcium  channel blocker, therefore he was started on Amiodarone  40 mg twice daily for 3 days followed by 200 mg twice daily for 1 week then 200 mg daily. Given his CHA2DS2-VASc score of 4, was recommended to start anticoagulation and this was reviewed with surgery who recommended waiting at least 3 days postoperatively for full dose anticoagulation. Echocardiogram during admission did show a preserved EF of 55 to 60% with no regional wall motion abnormalities. RV function was normal and he did not have any significant valve abnormalities. Was noted to have mild dilatation of the aortic root at 40 mm and mild dilatation of the ascending aorta at 41 mm.  In talking with the patient and his friend today, he reports overall being asymptomatic with his arrhythmia and denies any recent chest pain or palpitations. Breathing has been stable and no recent orthopnea, PND or pitting edema. He is in pain today as he reports having a muscle spasm along his left leg which occurred when getting into the car to come to his visit today. He has reduced Amiodarone  to 200 mg twice daily and is also taking Eliquis  5 mg twice daily. No reports of active bleeding.  Studies Reviewed:   EKG: EKG is ordered today and demonstrates:    EKG Interpretation Date/Time:  Tuesday March 11 2024 13:40:38 EDT Ventricular Rate:  121 PR Interval:    QRS Duration:  134 QT Interval:  338 QTC Calculation: 479 R Axis:   -34  Text Interpretation: Atrial fibrillation with rapid ventricular response with premature ventricular or aberrantly conducted complexes Left axis deviation Right bundle branch block Confirmed by Woodfin Hays ([id number]) on 03/11/2024 1:54:08 PM       Echocardiogram: 03/2024 IMPRESSIONS     1. Left ventricular ejection fraction, by estimation, is 55 to 60%. The  left ventricle has normal function. The left ventricle has no regional  wall motion abnormalities. There is mild concentric left ventricular  hypertrophy. Left ventricular diastolic  function could not be evaluated.   2. Right ventricular systolic function is normal. The right ventricular  size is normal. There is normal pulmonary artery systolic pressure.   3. Left atrial size was mild to moderately dilated.   4. The mitral valve is normal in structure. No evidence of mitral valve  regurgitation. No evidence of mitral stenosis.   5. The aortic valve is normal in structure. Aortic valve regurgitation is  not visualized. No aortic stenosis is present.   6. Aortic dilatation noted. There is mild dilatation of the aortic root,  measuring 40 mm. There is mild dilatation of the ascending aorta,  measuring 41 mm.   7. The inferior vena cava is normal in size with greater than 50%  respiratory variability, suggesting right atrial  pressure of 3 mmHg.    Risk Assessment/Calculations:    CHA2DS2-VASc Score = 4   This indicates a 4.8% annual risk of stroke. The patient's score is based upon: CHF History: 0 HTN History: 0 Diabetes History: 1 Stroke History: 2 Vascular Disease History: 0 Age Score: 1 Gender Score: 0    Physical Exam:   VS:  BP 102/60 (BP Location: Left Arm, Cuff Size: Normal)   Pulse (!) 102   Ht 6' (1.829 m)   Wt 282 lb (127.9  kg)   SpO2 98%   BMI 38.25 kg/m    Wt Readings from Last 3 Encounters:  03/11/24 282 lb (127.9 kg)  03/03/24 281 lb (127.5 kg)  02/22/24 281 lb (127.5 kg)     GEN: Well nourished, well developed in no acute distress NECK: No JVD; No carotid bruits CARDIAC: Irregular irregular, no murmurs, rubs, gallops RESPIRATORY:  Clear to auscultation without rales, wheezing or rhonchi  ABDOMEN: Appears non-distended. No obvious abdominal masses. EXTREMITIES: No clubbing or cyanosis. No pitting edema. Dressing in place along left knee.    Assessment and Plan:   1. Postoperative atrial fibrillation (HCC) - Was listed as having postoperative atrial fibrillation during his recent admission but by review of his EKG from 02/22/2024, this is concerning for atrial flutter at his preoperative appointment. Says that he has a Kardia monitor at home and heart rate was previously well-controlled. - Will continue with Amiodarone  200 mg twice daily until 03/16/2024 and then reduce to 200 mg daily. He will continue on Eliquis  5 mg twice daily for anticoagulation. Will arrange for follow-up in 3 weeks. If he remains in atrial fibrillation/flutter and has been compliant with Eliquis  and not missed doses, would arrange for DCCV (will review with Dr. Mallipeddi as well). This was tentatively reviewed with him today.  Unable to add AV nodal blocking agents given his soft BP.  2. Orthostatic hypotension - No recent symptoms. BP is soft at 102/60 during today's visit. Continue to avoid antihypertensives at this time.  3. Mixed hyperlipidemia - Followed by his PCP. LDL was at 31 when checked in 01/2024. Continue current medical therapy with Atorvastatin  10 mg daily.  4. Dilatation of Aortic Root - Recent echocardiogram showed mild dilatation of the aortic root at 40 mm and mild dilatation of the ascending aorta at 41 mm. Will continue to follow and anticipate repeat imaging in 1 year for reassessment.  Signed, Dorma Gash, PA-C

## 2024-03-11 NOTE — Patient Instructions (Signed)
 Medication Instructions:   Reduce Amiodarone  to 200 mg Daily on 03/16/24  *If you need a refill on your cardiac medications before your next appointment, please call your pharmacy*  Lab Work: NONE   If you have labs (blood work) drawn today and your tests are completely normal, you will receive your results only by: MyChart Message (if you have MyChart) OR A paper copy in the mail If you have any lab test that is abnormal or we need to change your treatment, we will call you to review the results.  Testing/Procedures: NONE   Follow-Up: At [facility], you and your health needs are our priority.  As part of our continuing mission to provide you with exceptional heart care, our providers are all part of one team.  This team includes your primary Cardiologist (physician) and Advanced Practice Providers or APPs (Physician Assistants and Nurse Practitioners) who all work together to provide you with the care you need, when you need it.  Your next appointment:   3 week(s)  Provider:   Woodfin Hays, PA-C    We recommend signing up for the patient portal called "MyChart".  Sign up information is provided on this After Visit Summary.  MyChart is used to connect with patients for Virtual Visits (Telemedicine).  Patients are able to view lab/test results, encounter notes, upcoming appointments, etc.  Non-urgent messages can be sent to your provider as well.   To learn more about what you can do with MyChart, go to [email or web address].   Other Instructions Thank you for choosing [facility]!

## 2024-03-12 ENCOUNTER — Telehealth: Payer: Self-pay | Admitting: Internal Medicine

## 2024-03-12 NOTE — Telephone Encounter (Signed)
 I will forward pharmacy response to Dr.Hall's office

## 2024-03-12 NOTE — Telephone Encounter (Signed)
I will forward to PharmD for review.

## 2024-03-12 NOTE — Telephone Encounter (Signed)
 Pt c/o medication issue:  1. Name of Medication:   citalopram  (CELEXA ) 40 MG tablet   2. How are you currently taking this medication (dosage and times per day)?   As prescribed  3. Are you having a reaction (difficulty breathing--STAT)?   4. What is your medication issue?     Caller Bambi Lever) stated patient has been on this medication and has now been prescribed amiodarone  (PACERONE ) 200 MG tablet .  Caller wants to know if patient should be taken off the Celexa  or be prescribed a different anti-depressant.

## 2024-03-13 DIAGNOSIS — F32A Depression, unspecified: Secondary | ICD-10-CM | POA: Diagnosis not present

## 2024-03-13 DIAGNOSIS — E785 Hyperlipidemia, unspecified: Secondary | ICD-10-CM | POA: Diagnosis not present

## 2024-03-13 DIAGNOSIS — I10 Essential (primary) hypertension: Secondary | ICD-10-CM | POA: Diagnosis not present

## 2024-03-13 DIAGNOSIS — E119 Type 2 diabetes mellitus without complications: Secondary | ICD-10-CM | POA: Diagnosis not present

## 2024-03-13 DIAGNOSIS — F419 Anxiety disorder, unspecified: Secondary | ICD-10-CM | POA: Diagnosis not present

## 2024-03-13 DIAGNOSIS — M1711 Unilateral primary osteoarthritis, right knee: Secondary | ICD-10-CM | POA: Diagnosis not present

## 2024-03-13 DIAGNOSIS — I480 Paroxysmal atrial fibrillation: Secondary | ICD-10-CM | POA: Diagnosis not present

## 2024-03-13 DIAGNOSIS — G4730 Sleep apnea, unspecified: Secondary | ICD-10-CM | POA: Diagnosis not present

## 2024-03-13 DIAGNOSIS — Z471 Aftercare following joint replacement surgery: Secondary | ICD-10-CM | POA: Diagnosis not present

## 2024-03-14 DIAGNOSIS — Z471 Aftercare following joint replacement surgery: Secondary | ICD-10-CM | POA: Diagnosis not present

## 2024-03-14 DIAGNOSIS — M1711 Unilateral primary osteoarthritis, right knee: Secondary | ICD-10-CM | POA: Diagnosis not present

## 2024-03-14 DIAGNOSIS — G4730 Sleep apnea, unspecified: Secondary | ICD-10-CM | POA: Diagnosis not present

## 2024-03-14 DIAGNOSIS — E785 Hyperlipidemia, unspecified: Secondary | ICD-10-CM | POA: Diagnosis not present

## 2024-03-14 DIAGNOSIS — I480 Paroxysmal atrial fibrillation: Secondary | ICD-10-CM | POA: Diagnosis not present

## 2024-03-14 DIAGNOSIS — F419 Anxiety disorder, unspecified: Secondary | ICD-10-CM | POA: Diagnosis not present

## 2024-03-14 DIAGNOSIS — I10 Essential (primary) hypertension: Secondary | ICD-10-CM | POA: Diagnosis not present

## 2024-03-14 DIAGNOSIS — E119 Type 2 diabetes mellitus without complications: Secondary | ICD-10-CM | POA: Diagnosis not present

## 2024-03-14 DIAGNOSIS — F32A Depression, unspecified: Secondary | ICD-10-CM | POA: Diagnosis not present

## 2024-03-17 DIAGNOSIS — E785 Hyperlipidemia, unspecified: Secondary | ICD-10-CM | POA: Diagnosis not present

## 2024-03-17 DIAGNOSIS — F419 Anxiety disorder, unspecified: Secondary | ICD-10-CM | POA: Diagnosis not present

## 2024-03-17 DIAGNOSIS — I10 Essential (primary) hypertension: Secondary | ICD-10-CM | POA: Diagnosis not present

## 2024-03-17 DIAGNOSIS — E119 Type 2 diabetes mellitus without complications: Secondary | ICD-10-CM | POA: Diagnosis not present

## 2024-03-17 DIAGNOSIS — F32A Depression, unspecified: Secondary | ICD-10-CM | POA: Diagnosis not present

## 2024-03-17 DIAGNOSIS — Z471 Aftercare following joint replacement surgery: Secondary | ICD-10-CM | POA: Diagnosis not present

## 2024-03-17 DIAGNOSIS — I480 Paroxysmal atrial fibrillation: Secondary | ICD-10-CM | POA: Diagnosis not present

## 2024-03-17 DIAGNOSIS — M1711 Unilateral primary osteoarthritis, right knee: Secondary | ICD-10-CM | POA: Diagnosis not present

## 2024-03-17 DIAGNOSIS — G4730 Sleep apnea, unspecified: Secondary | ICD-10-CM | POA: Diagnosis not present

## 2024-03-18 DIAGNOSIS — M1712 Unilateral primary osteoarthritis, left knee: Secondary | ICD-10-CM | POA: Diagnosis not present

## 2024-03-20 ENCOUNTER — Encounter (HOSPITAL_COMMUNITY): Payer: Self-pay

## 2024-03-20 ENCOUNTER — Ambulatory Visit: Admitting: Nurse Practitioner

## 2024-03-20 ENCOUNTER — Telehealth: Payer: Self-pay | Admitting: Student

## 2024-03-20 ENCOUNTER — Ambulatory Visit (HOSPITAL_COMMUNITY): Attending: Orthopedic Surgery

## 2024-03-20 DIAGNOSIS — R29898 Other symptoms and signs involving the musculoskeletal system: Secondary | ICD-10-CM | POA: Insufficient documentation

## 2024-03-20 DIAGNOSIS — M25662 Stiffness of left knee, not elsewhere classified: Secondary | ICD-10-CM | POA: Insufficient documentation

## 2024-03-20 DIAGNOSIS — Z96652 Presence of left artificial knee joint: Secondary | ICD-10-CM | POA: Insufficient documentation

## 2024-03-20 NOTE — Telephone Encounter (Signed)
 Spoke with pt who states that he was seen by [facility] PT. Pt reports that he was unable to complete PT d/t low blood pressure and elevated Hr. Pt states that he feels that he is dehydrated because on that day he only drank coffee and no water . He reports feeling dizzy and feels that too is coming from the low BP. The pt did increase fluids and is feeling better. Offered appt today with E.Peck in [place] but pt decided to wait until he sees B.Strader on 04/02/24. Pt states that he will do ECG on Kardia mobile and bring to next visit.

## 2024-03-20 NOTE — Telephone Encounter (Signed)
 Pt notified of B. Strader's response via voicemail. ( OK per DPR)

## 2024-03-20 NOTE — Telephone Encounter (Signed)
 Patient c/o Palpitations: STAT if patient c/o lightheadedness, shortness of breath, or chest pain  How long have you had palpitations/irregular HR/ Afib? Are you having the symptoms now? Since the surgery and that was on 03/03/24  Are you currently experiencing lightheadedness, SOB or CP? No   Do you have a history of afib (atrial fibrillation) or irregular heart rhythm? Yes  Have you checked your BP or HR? (document readings if available): 86/64 BP and 116 HR   Are you experiencing any other symptoms? No, but pt is requesting a callback regarding him not being able to have physical therapy today due to them telling him he's in afib. Please advise

## 2024-03-20 NOTE — Therapy (Signed)
 [facility] Outpatient Rehabilitation at [facility] [address] [place], [place], [address] Phone: [phone]   Fax:  [phone]  Patient Details  Name: Seth [name] MRN: [id number] Date of Birth: 01/28/1956 Referring Provider:  Murleen Arms, MD  Encounter Date: 03/20/2024  Pt arrived with friend for physical therapy evaluation, upon walking from waiting room to therapy gym patient became dizzy need to sit down.  Vitals assessed below.  Patient reporting he did not eat or drink or have his electrolytes this morning.  03/20/2024 PT Evaluation  Initial BP: 96/66 HR 1222 3 mins later  sitting BP: 86/66 HR 131 3 mins latera sitting BP: 84/68 HR 120 3 mins later sitting BP: 76/62 HR 116  Patient was advised to call cardiologist or seek emergent care at urgent care or ED due recent discovery of A-fib with RVR after discharge from hospital for left TKA.  Patient determining that he would call his cardiologist first.   Gatha Kaska, PT 03/20/2024, 10:17 AM  [facility] Outpatient Rehabilitation at [facility] [address] [place], [place], [address] Phone: [phone]   Fax:  [phone]

## 2024-03-25 ENCOUNTER — Other Ambulatory Visit: Payer: Self-pay

## 2024-03-25 ENCOUNTER — Encounter (HOSPITAL_COMMUNITY): Payer: Self-pay

## 2024-03-25 ENCOUNTER — Ambulatory Visit (HOSPITAL_COMMUNITY)

## 2024-03-25 DIAGNOSIS — M25662 Stiffness of left knee, not elsewhere classified: Secondary | ICD-10-CM

## 2024-03-25 DIAGNOSIS — Z96652 Presence of left artificial knee joint: Secondary | ICD-10-CM

## 2024-03-25 DIAGNOSIS — R29898 Other symptoms and signs involving the musculoskeletal system: Secondary | ICD-10-CM | POA: Diagnosis not present

## 2024-03-25 NOTE — Therapy (Signed)
 OUTPATIENT PHYSICAL THERAPY LOWER EXTREMITY EVALUATION   Patient Name: Seth [name] MRN: [id number] DOB:08-01-1956, 68 y.o., male Today's Date: 03/25/2024  END OF SESSION:  PT End of Session - 03/25/24 0934     Visit Number 1    Number of Visits 10    Date for PT Re-Evaluation 04/22/24    Authorization Type [employer] PPO    Progress Note Due on Visit 10    PT Start Time 0935    PT Stop Time 1020    PT Time Calculation (min) 45 min    Activity Tolerance Patient tolerated treatment well    Behavior During Therapy WFL for tasks assessed/performed          Past Medical History:  Diagnosis Date   Anxiety    Brain bleed (HCC)    Intracerebral bleed - [employer] 2013   Bronchitis    Depression    Diabetes mellitus without complication (HCC)    History of kidney stones    Hypertension    Osteoarthritis of both knees    Pre-diabetes    Sleep apnea    dx. at one time, then they said no- mild, no cpap   Transient ischemic attack (TIA)    Past Surgical History:  Procedure Laterality Date   ACHILLES TENDON SURGERY Right 1994   BARIATRIC SURGERY     2005   cataracts Bilateral 2008   CHOLECYSTECTOMY     COLONOSCOPY  2015   Outside facility:  medium-sized lipoma in transverse colon.    EXTRACORPOREAL SHOCK WAVE LITHOTRIPSY Left 06/13/2018   Procedure: LEFT EXTRACORPOREAL SHOCK WAVE LITHOTRIPSY (ESWL) WITH MAC;  Surgeon: Watt Rush, MD;  Location: [facility];  Service: Urology;  Laterality: Left;   repair of wrist fx Right    SPINAL CORD STIMULATOR INSERTION N/A 12/01/2020   Procedure: Lumbar spinal cord stimulator placement;  Surgeon: Darlis Deatrice RAMAN, MD;  Location: [facility] OR;  Service: Neurosurgery;  Laterality: N/A;   TONSILLECTOMY     TOTAL KNEE ARTHROPLASTY Left 03/03/2024   Procedure: ARTHROPLASTY, KNEE, TOTAL;  Surgeon: Edna Toribio LABOR, MD;  Location: [facility];  Service: Orthopedics;  Laterality: Left;   Patient Active Problem List   Diagnosis Date Noted    Primary osteoarthritis of left knee 03/03/2024   OSA (obstructive sleep apnea) 05/28/2023   Orthostatic hypotension 02/08/2023   Screening for colorectal cancer 09/02/2020   Knee pain 07/15/2020   Sciatica of right side 04/19/2017   TIA (transient ischemic attack) 04/19/2017   Adjustment disorder with anxious mood 03/23/2016   Opioid type dependence, continuous (HCC) 12/08/2015   Seizure prophylaxis 10/13/2015   Allergic rhinitis 12/17/2014   Morbid obesity (HCC) 05/06/2014   Anxiety 07/17/2012   Depression 07/17/2012   Essential hypertension 07/17/2012   Intraparenchymal hemorrhage of brain (HCC) 07/16/2012    PCP: Shona Rush PEDLAR, MD  REFERRING PROVIDER: Edna Toribio LABOR, MD  REFERRING DIAG: S/P Lt TKR (DOS 03/03/2024) needs to start on 03/06/2024  THERAPY DIAG:  History of total left knee replacement  Weakness of left lower extremity  Decreased range of motion (ROM) of left knee  Rationale for Evaluation and Treatment: Rehabilitation  ONSET DATE:  L TKR June 2nd, 2025  SUBJECTIVE:   SUBJECTIVE STATEMENT: L TKR on 03-03-24 BP today 98/76   PERTINENT HISTORY: A-fib  R knee pain, possible TKR needed  PAIN:  Are you having pain? Yes: NPRS scale: 5/10 Pain location: L knee joint Pain description: Constant throbbing Aggravating factors: Bending Relieving  factors: Ice  PRECAUTIONS: None  RED FLAGS: None   WEIGHT BEARING RESTRICTIONS: No  FALLS:  Has patient fallen in last 6 months? No Near fall last week when he was dizzy  LIVING ENVIRONMENT: Lives with: lives with their family and lives with their son, lives with godson Lives in: House/apartment Stairs: Yes: Internal: 12 steps; on right going up and External: 4 steps; on right going up and on left going up Has following equipment at home: Single point cane  OCCUPATION: Work for [employer], Driving during   PLOF: Independent  PATIENT GOALS: Just more range of movement and for  this constant pain to get better  NEXT MD VISIT: July 2025  OBJECTIVE:  Note: Objective measures were completed at Evaluation unless otherwise noted.  DIAGNOSTIC FINDINGS:   PATIENT SURVEYS:  Lower Extremity Functional Score: 45 / 80 = 56.3 %  COGNITION: Overall cognitive status: Within functional limits for tasks assessed     SENSATION: WFL  EDEMA:  Minimal edema to note in LLE  POSTURE: rounded shoulders and forward head  PALPATION:   LOWER EXTREMITY ROM:  Active ROM Right eval Left eval  Hip flexion    Hip extension    Hip abduction    Hip adduction    Hip internal rotation    Hip external rotation    Knee flexion  100  Knee extension  -4  Ankle dorsiflexion    Ankle plantarflexion    Ankle inversion    Ankle eversion     (Blank rows = not tested)  LOWER EXTREMITY MMT:  MMT Right eval Left eval  Hip flexion 4 4  Hip extension    Hip abduction    Hip adduction    Hip internal rotation    Hip external rotation    Knee flexion 4+ 4-  Knee extension 4+ 4-  Ankle dorsiflexion 5 5  Ankle plantarflexion    Ankle inversion    Ankle eversion     (Blank rows = not tested)  LOWER EXTREMITY SPECIAL TESTS:   FUNCTIONAL TESTS:  30 seconds chair stand test 2 minute walk test: next session  30 sec chair stand test: 12 STS, knee just feels tired  GAIT: Distance walked: 100 Assistive device utilized: Single point cane Level of assistance: Modified independence Comments: SPC on R, dec weight shift to LLE, dec step lengths, antalgic-like at times                                                                                                                                TREATMENT DATE:  03/25/24: PT Eval and HEP    PATIENT EDUCATION:  Education details: PT evaluation, objective findings, POC, Importance of HEP, Precautions, Clinic policies  Person educated: Patient and friend Education method: Explanation and Demonstration Education  comprehension: verbalized understanding and returned demonstration  HOME EXERCISE PROGRAM: Access Code: [email or web address] URL: [email or web address] Date: 03/25/2024 Prepared by: Rosaria Powell-Butler  Exercises - Seated Heel Slide  - 2 x daily - 7 x weekly - 3 sets - 10 reps - Seated Long Arc Quad  - 2 x daily - 7 x weekly - 3 sets - 10 reps - Sit to Stand  - 2 x daily - 7 x weekly - 3 sets - 10 reps - Clamshell  - 2 x daily - 7 x weekly - 3 sets - 10 reps - Standing Hip Extension with Counter Support  - 2 x daily - 7 x weekly - 3 sets - 10 reps - Heel Raises with Counter Support  - 2 x daily - 7 x weekly - 3 sets - 10 reps  ASSESSMENT:  CLINICAL IMPRESSION: Patient is a 68 y.o. male who was seen today for physical therapy evaluation and treatment for S/P Lt TKR (DOS 03/03/2024). Patient 3 weeks status post L TKR. First attempt at initial evaluation unsuccessful due to BP concerns. Today's BP: 98/76, with no symptoms. On this date, patient demonstrates decreased ROM in L knee, decreased L knee strength, difficulty with transfers, pain, and altered gait pattern which all are contributing to patient's impaired function. Patient will benefit from skilled physical therapy in order to address the above to return to [employer] and work duties.    OBJECTIVE IMPAIRMENTS: Abnormal gait, decreased activity tolerance, decreased balance, decreased endurance, decreased mobility, difficulty walking, decreased ROM, decreased strength, improper body mechanics, postural dysfunction, and pain.   ACTIVITY LIMITATIONS: lifting, bending, sitting, standing, squatting, stairs, and transfers  PARTICIPATION LIMITATIONS: cleaning, laundry, driving, community activity, and occupation  PERSONAL FACTORS: N/A are also affecting patient's functional outcome.   REHAB POTENTIAL: Good  CLINICAL DECISION MAKING: Stable/uncomplicated  EVALUATION COMPLEXITY: Low   GOALS: Goals reviewed with patient? No  SHORT  TERM GOALS: Target date: 04/08/24 Patient will be independent with performance of HEP to demonstrate adequate self management of symptoms.  Baseline:  Goal status: INITIAL  2.   Patient will report at least a 25% improvement with function or pain overall since beginning PT. Baseline:  Goal status: INITIAL   LONG TERM GOALS: Target date: 04/22/24 Patient will improve LEFS score by 9 points to demonstrate improved perceived function while meeting MCID.  Baseline: Goal status: INITIAL 2.  Patient will improve L knee flexion ROM to at least 120 degrees to show improved LE mobility for improve functional transfers and QOL.  Baseline:  Goal status: INITIAL 3.  Patient will improve L knee extension ROM to at least 0 degrees to show improved LE mobility for improve functional transfers and QOL.  Baseline:  Goal status: INITIAL   4.  Patient will improve 30 second chair stand test by at least 3 STS in order to demonstrate improved LE strength and endurance needed for community ambulation.  Baseline: Goal status: INITIAL    PLAN:  PT FREQUENCY: 1-2x/week  PT DURATION: 4 weeks  PLANNED INTERVENTIONS: 97164- PT Re-evaluation, 97110-Therapeutic exercises, 97530- Therapeutic activity, [id number]- Neuromuscular re-education, 97535- Self Care, 02859- Manual therapy, [id number]- Gait training, [phone]- Electrical stimulation (manual), [id number]- Traction (mechanical), [phone] (1-2 muscles), [id number] (3+ muscles)- Dry Needling, Patient/Family education, Balance training, Stair training, Taping, Joint mobilization, Spinal mobilization, Scar mobilization, Cryotherapy, and Moist heat  PLAN FOR NEXT SESSION: review goals and HEP, test, progress L knee mobility, LE strengthening, assess balance (likely FGA/DGI), stair training  11:05 AM, 03/25/24 Rosaria Settler, PT, DPT [facility] Auth Request  Referring diagnosis code (ICD 10)? S03.347 Treatment diagnosis codes (  ICD 10)?  (if different than referring diagnosis)  Z96.652, R29.898, M25.662 What was this (referring dx) caused by? [x]  Surgery []  Fall []  Ongoing issue []  Arthritis []  Other: ____________  Laterality: []  Rt [x]  Lt []  Both  Deficits: [x]  Pain [x]  Stiffness [x]  Weakness []  Edema [x]  Balance Deficits []  Coordination [x]  Gait Disturbance [x]  ROM []  Other   Functional Tool Score:  Lower Extremity Functional Score: 45 / 80 = 56.3 % 30 sec chair stand test: 12 STS  CPT codes: See Planned Interventions listed in the Plan section of the Evaluation.

## 2024-03-26 ENCOUNTER — Other Ambulatory Visit (HOSPITAL_BASED_OUTPATIENT_CLINIC_OR_DEPARTMENT_OTHER): Payer: Self-pay

## 2024-03-27 ENCOUNTER — Encounter (HOSPITAL_COMMUNITY): Payer: Self-pay

## 2024-03-27 ENCOUNTER — Ambulatory Visit (HOSPITAL_COMMUNITY)

## 2024-03-27 DIAGNOSIS — R29898 Other symptoms and signs involving the musculoskeletal system: Secondary | ICD-10-CM | POA: Diagnosis not present

## 2024-03-27 DIAGNOSIS — Z96652 Presence of left artificial knee joint: Secondary | ICD-10-CM | POA: Diagnosis not present

## 2024-03-27 DIAGNOSIS — M25662 Stiffness of left knee, not elsewhere classified: Secondary | ICD-10-CM | POA: Diagnosis not present

## 2024-03-27 NOTE — Therapy (Signed)
 OUTPATIENT PHYSICAL THERAPY LOWER EXTREMITY EVALUATION   Patient Name: Seth [name] MRN: [id number] DOB:01/20/1956, 68 y.o., male Today's Date: 03/27/2024  END OF SESSION:  PT End of Session - 03/27/24 0857     Visit Number 2    Number of Visits 10    Date for PT Re-Evaluation 04/22/24    Authorization Type [employer] Choice PPO    Progress Note Due on Visit 10    PT Start Time 0851    PT Stop Time 0929    PT Time Calculation (min) 38 min    Activity Tolerance Patient tolerated treatment well    Behavior During Therapy [facility] for tasks assessed/performed           Past Medical History:  Diagnosis Date   Anxiety    Brain bleed (HCC)    Intracerebral bleed - [employer] 2013   Bronchitis    Depression    Diabetes mellitus without complication (HCC)    History of kidney stones    Hypertension    Osteoarthritis of both knees    Pre-diabetes    Sleep apnea    dx. at one time, then they said no- mild, no cpap   Transient ischemic attack (TIA)    Past Surgical History:  Procedure Laterality Date   ACHILLES TENDON SURGERY Right 1994   BARIATRIC SURGERY     2005   cataracts Bilateral 2008   CHOLECYSTECTOMY     COLONOSCOPY  2015   Outside facility:  medium-sized lipoma in transverse colon.    EXTRACORPOREAL SHOCK WAVE LITHOTRIPSY Left 06/13/2018   Procedure: LEFT EXTRACORPOREAL SHOCK WAVE LITHOTRIPSY (ESWL) WITH MAC;  Surgeon: Watt Rush, MD;  Location: [facility];  Service: Urology;  Laterality: Left;   repair of wrist fx Right    SPINAL CORD STIMULATOR INSERTION N/A 12/01/2020   Procedure: Lumbar spinal cord stimulator placement;  Surgeon: Darlis Deatrice RAMAN, MD;  Location: [facility] OR;  Service: Neurosurgery;  Laterality: N/A;   TONSILLECTOMY     TOTAL KNEE ARTHROPLASTY Left 03/03/2024   Procedure: ARTHROPLASTY, KNEE, TOTAL;  Surgeon: Edna Toribio LABOR, MD;  Location: [facility];  Service: Orthopedics;  Laterality: Left;   Patient Active Problem List   Diagnosis Date Noted    Primary osteoarthritis of left knee 03/03/2024   OSA (obstructive sleep apnea) 05/28/2023   Orthostatic hypotension 02/08/2023   Screening for colorectal cancer 09/02/2020   Knee pain 07/15/2020   Sciatica of right side 04/19/2017   TIA (transient ischemic attack) 04/19/2017   Adjustment disorder with anxious mood 03/23/2016   Opioid type dependence, continuous (HCC) 12/08/2015   Seizure prophylaxis 10/13/2015   Allergic rhinitis 12/17/2014   Morbid obesity (HCC) 05/06/2014   Anxiety 07/17/2012   Depression 07/17/2012   Essential hypertension 07/17/2012   Intraparenchymal hemorrhage of brain (HCC) 07/16/2012    PCP: Shona Rush PEDLAR, MD  REFERRING PROVIDER: Edna Toribio LABOR, MD  REFERRING DIAG: S/P Lt TKR (DOS 03/03/2024) needs to start on 03/06/2024  THERAPY DIAG:  History of total left knee replacement  Weakness of left lower extremity  Decreased range of motion (ROM) of left knee  Rationale for Evaluation and Treatment: Rehabilitation  ONSET DATE:  L TKR June 2nd, 2025  SUBJECTIVE:   SUBJECTIVE STATEMENT: Pt reporting 5/10 pain. Pt reporting some mild dizziness.    PERTINENT HISTORY: A-fib  R knee pain, possible TKR needed  PAIN:  Are you having pain? Yes: NPRS scale: 5/10 Pain location: L knee joint Pain description: Constant throbbing  Aggravating factors: Bending Relieving factors: Ice  PRECAUTIONS: None  RED FLAGS: None   WEIGHT BEARING RESTRICTIONS: No  FALLS:  Has patient fallen in last 6 months? No Near fall last week when he was dizzy  LIVING ENVIRONMENT: Lives with: lives with their family and lives with their son, lives with godson Lives in: House/apartment Stairs: Yes: Internal: 12 steps; on right going up and External: 4 steps; on right going up and on left going up Has following equipment at home: Single point cane  OCCUPATION: Work for [employer], Driving during   PLOF: Independent  PATIENT GOALS: Just more  range of movement and for this constant pain to get better  NEXT MD VISIT: July 2025  OBJECTIVE:  Note: Objective measures were completed at Evaluation unless otherwise noted.  DIAGNOSTIC FINDINGS:   PATIENT SURVEYS:  Lower Extremity Functional Score: 45 / 80 = 56.3 %  COGNITION: Overall cognitive status: Within functional limits for tasks assessed     SENSATION: WFL  EDEMA:  Minimal edema to note in LLE  POSTURE: rounded shoulders and forward head  PALPATION:   LOWER EXTREMITY ROM:  Active ROM Right eval Left eval  Hip flexion    Hip extension    Hip abduction    Hip adduction    Hip internal rotation    Hip external rotation    Knee flexion  100  Knee extension  -4  Ankle dorsiflexion    Ankle plantarflexion    Ankle inversion    Ankle eversion     (Blank rows = not tested)  LOWER EXTREMITY MMT:  MMT Right eval Left eval  Hip flexion 4 4  Hip extension    Hip abduction    Hip adduction    Hip internal rotation    Hip external rotation    Knee flexion 4+ 4-  Knee extension 4+ 4-  Ankle dorsiflexion 5 5  Ankle plantarflexion    Ankle inversion    Ankle eversion     (Blank rows = not tested)  LOWER EXTREMITY SPECIAL TESTS:   FUNCTIONAL TESTS:  30 seconds chair stand test 2 minute walk test: next session  30 sec chair stand test: 12 STS, knee just feels tired  GAIT: Distance walked: 100 Assistive device utilized: Single point cane Level of assistance: Modified independence Comments: SPC on R, dec weight shift to LLE, dec step lengths, antalgic-like at times                                                                                                                                TREATMENT DATE:  03/27/2024  -BP: Unable to read, attempted manual unable to interpret at this time. SpO2: 97% HR 128 bpm -Seated knee flexion 10x10'' - : 278ft HR: 123 bpm SpO2: 95% - Standing knee drive on 87pw box 10 x 10''- 102 degrees -Seated  hamstring stretch with cues for ankle DF 2x1' -Standing gastrocnemius stretch  1x1'    03/25/24: PT Eval and HEP    PATIENT EDUCATION:  Education details: PT evaluation, objective findings, POC, Importance of HEP, Precautions, Clinic policies  Person educated: Patient and friend Education method: Medical illustrator Education comprehension: verbalized understanding and returned demonstration  HOME EXERCISE PROGRAM: Access Code: [email or web address] URL: [email or web address] Date: 03/25/2024 Prepared by: Rosaria Powell-Butler  Exercises - Seated Heel Slide  - 2 x daily - 7 x weekly - 3 sets - 10 reps - Seated Long Arc Quad  - 2 x daily - 7 x weekly - 3 sets - 10 reps - Sit to Stand  - 2 x daily - 7 x weekly - 3 sets - 10 reps - Clamshell  - 2 x daily - 7 x weekly - 3 sets - 10 reps - Standing Hip Extension with Counter Support  - 2 x daily - 7 x weekly - 3 sets - 10 reps - Heel Raises with Counter Support  - 2 x daily - 7 x weekly - 3 sets - 10 reps  ASSESSMENT:  CLINICAL IMPRESSION: Pt tolerating therapy session well.  completed at 269ft. Pt with great left knee ROM at 104. Good control. Intermittently lifting SPC during walk test. Would focus on left gluteal medius strengthening to assist with proper mechanics and normalizing gait. Educated pt to continue mobility, HEP and normal activities.  Patient will benefit from skilled physical therapy in order to address the above to return to [facility] and work duties.    OBJECTIVE IMPAIRMENTS: Abnormal gait, decreased activity tolerance, decreased balance, decreased endurance, decreased mobility, difficulty walking, decreased ROM, decreased strength, improper body mechanics, postural dysfunction, and pain.   ACTIVITY LIMITATIONS: lifting, bending, sitting, standing, squatting, stairs, and transfers  PARTICIPATION LIMITATIONS: cleaning, laundry, driving, community activity, and occupation  PERSONAL FACTORS: N/A are also  affecting patient's functional outcome.   REHAB POTENTIAL: Good  CLINICAL DECISION MAKING: Stable/uncomplicated  EVALUATION COMPLEXITY: Low   GOALS: Goals reviewed with patient? No  SHORT TERM GOALS: Target date: 04/08/24 Patient will be independent with performance of HEP to demonstrate adequate self management of symptoms.  Baseline:  Goal status: INITIAL  2.   Patient will report at least a 25% improvement with function or pain overall since beginning PT. Baseline:  Goal status: INITIAL   LONG TERM GOALS: Target date: 04/22/24 Patient will improve LEFS score by 9 points to demonstrate improved perceived function while meeting MCID.  Baseline: Goal status: INITIAL 2.  Patient will improve L knee flexion ROM to at least 120 degrees to show improved LE mobility for improve functional transfers and QOL.  Baseline:  Goal status: INITIAL 3.  Patient will improve L knee extension ROM to at least 0 degrees to show improved LE mobility for improve functional transfers and QOL.  Baseline:  Goal status: INITIAL   4.  Patient will improve 30 second chair stand test by at least 3 STS in order to demonstrate improved LE strength and endurance needed for community ambulation.  Baseline: Goal status: INITIAL    PLAN:  PT FREQUENCY: 1-2x/week  PT DURATION: 4 weeks  PLANNED INTERVENTIONS: 97164- PT Re-evaluation, 97110-Therapeutic exercises, 97530- Therapeutic activity, [id number]- Neuromuscular re-education, 97535- Self Care, 02859- Manual therapy, [id number]- Gait training, [phone]- Electrical stimulation (manual), [id number]- Traction (mechanical), [phone] (1-2 muscles), 20561 (3+ muscles)- Dry Needling, Patient/Family education, Balance training, Stair training, Taping, Joint mobilization, Spinal mobilization, Scar mobilization, Cryotherapy, and Moist heat  PLAN FOR NEXT SESSION: progress L knee mobility, LE strengthening, DGI  next session , stair training  Omega JONETTA Donna ALMETA, DPT [facility]  Outpatient [facility]- [place] [phone] office  9:29 AM, 03/27/24

## 2024-03-27 NOTE — H&P (View-Only) (Signed)
 Cardiology Office Note    Date:  04/02/2024  ID:  Seth [name], DOB [birth year], MRN [id number] Cardiologist: Diannah SHAUNNA Maywood, MD    History of Present Illness:    Seth [name] is a 68 y.o. male with past medical history of atrial fibrillation/flutter, orthostatic hypotension, HLD and dilatation of the aortic root who presents to the office today for 3-week follow-up.  He was examined by myself on 03/11/2024 following a recent hospitalization for left knee arthroplasty. He did have postoperative atrial fibrillation during that time and was started on Amiodarone  and Eliquis  once cleared to begin per orthopedics. At the time of his office visit, he denied any chest pain or palpitations and his breathing had been stable.  He reported compliance with Eliquis  and Amiodarone . By review of prior EKG's, it appeared that he had actually been in atrial flutter at his preoperative appointment on 02/22/2024. Was recommended to arrange for close follow-up and if he remained in atrial fibrillation/flutter, would arrange for DCCV once he completed 4 weeks of uninterrupted anticoagulation. Reviewed with Dr. Mallipeddi who was in agreement. Was unable to be started on AV nodal blocking agents given his BP at 102/60 during his office visit.  His physical therapist did reach out on 03/20/2024 as he had been tachycardic and hypotensive with BP as low as 86/64.  He was offered a visit at another office location but declined. Reported that he had not been consuming water  and was encouraged to increase his fluid intake to 2L per day.   In talking with the patient and his friend today, he denies any recent chest pain or palpitations. Does report occasional dizziness but this improves when he takes his Amiodarone  in the evening hours. BP was initially recorded at 82/47 during today's visit and at 94/60 on recheck (SBP further improved into the low 100's after he consumed water ). Does report dizziness with positional  changes but no symptoms while sitting still.  No specific orthopnea, PND or pitting edema. Reports PT is overall going well and he is progressing quicker than expected. Unfortunately, he has missed some doses of Eliquis  over the past few weeks.  Studies Reviewed:   EKG: EKG is ordered today and demonstrates:   EKG Interpretation Date/Time:  Wednesday April 02 2024 08:48:21 EDT Ventricular Rate:  121 PR Interval:    QRS Duration:  152 QT Interval:  386 QTC Calculation: 548 R Axis:   6  Text Interpretation: Atrial flutter with RVR Right bundle branch block Confirmed by Johnson Grate ([id number]) on 04/02/2024 8:49:17 AM       Echocardiogram: 03/2024 IMPRESSIONS     1. Left ventricular ejection fraction, by estimation, is 55 to 60%. The  left ventricle has normal function. The left ventricle has no regional  wall motion abnormalities. There is mild concentric left ventricular  hypertrophy. Left ventricular diastolic  function could not be evaluated.   2. Right ventricular systolic function is normal. The right ventricular  size is normal. There is normal pulmonary artery systolic pressure.   3. Left atrial size was mild to moderately dilated.   4. The mitral valve is normal in structure. No evidence of mitral valve  regurgitation. No evidence of mitral stenosis.   5. The aortic valve is normal in structure. Aortic valve regurgitation is  not visualized. No aortic stenosis is present.   6. Aortic dilatation noted. There is mild dilatation of the aortic root,  measuring 40 mm. There is mild dilatation of the ascending aorta,  measuring 41 mm.   7. The inferior vena cava is normal in size with greater than 50%  respiratory variability, suggesting right atrial pressure of 3 mmHg.   Risk Assessment/Calculations:    CHA2DS2-VASc Score = 4   This indicates a 4.8% annual risk of stroke. The patient's score is based upon: CHF History: 0 HTN History: 0 Diabetes History: 1 Stroke  History: 2 Vascular Disease History: 0 Age Score: 1 Gender Score: 0    Physical Exam:   VS:  BP 94/60   Pulse 91   Wt 281 lb (127.5 kg)   BMI 38.11 kg/m    Wt Readings from Last 3 Encounters:  04/02/24 281 lb (127.5 kg)  03/11/24 282 lb (127.9 kg)  03/03/24 281 lb (127.5 kg)     GEN: Well nourished, well developed in no acute distress NECK: No JVD; No carotid bruits CARDIAC: Irregularly irregular, no murmurs, rubs, gallops RESPIRATORY:  Clear to auscultation without rales, wheezing or rhonchi  ABDOMEN: Appears non-distended. No obvious abdominal masses. EXTREMITIES: No clubbing or cyanosis. No pitting edema.  Distal pedal pulses are 2+ bilaterally.   Assessment and Plan:   1. Persistent atrial fibrillation/flutter - Was initially diagnosed as having postoperative atrial fibrillation/flutter but by review of prior EKG's, it appears he had this prior to his surgery in 01/2024. He is still in atrial flutter with RVR by review of EKG today. Unfortunately, he has missed doses of Eliquis  as discussed above. Reviewed the option of TEE/DCCV with the patient to expedite this given his tachycardia and soft BP and he prefers to hold off on TEE. We also reviewed the option of ED evaluation and he reports he would never want to go to the ED.  Therefore, will arrange for DCCV after 04/22/2024 as this would allow for a minimum of 3 weeks of uninterrupted anticoagulation. I encouraged him to set an alarm on his phone to remember his doses. Will need to hold Mounjaro  one week prior to his procedure. We also reviewed warning signs to monitor for which would be concerning for acute heart failure in the interim. For now, continue Amiodarone  200 mg daily. Unable to add AV nodal blocking agents given soft BP. If he does not covert to NSR or has quick recurrence, would need to refer to EP.  - Continue Eliquis  5 mg twice daily for anticoagulation which is the appropriate dose given his current age, weight and  renal function.  Informed Consent   Shared Decision Making/Informed Consent The risks (stroke, cardiac arrhythmias rarely resulting in the need for a temporary or permanent pacemaker, skin irritation or burns and complications associated with conscious sedation including aspiration, arrhythmia, respiratory failure and death), benefits (restoration of normal sinus rhythm) and alternatives of a direct current cardioversion were explained in detail to Seth [name] and he agrees to proceed.   2. Orthostatic hypotension - BP was initially soft at 82/47 during today's visit and had improved to 94/60 on recheck.  He received water  during office visit with improvement as well as he reported not consuming water  prior to his visit  If BP remains soft, can consider the use of Midodrine but he reports it has overall been well-controlled with SBP in the 120's most of the time.   3. Mixed hyperlipidemia - LDL was well-controlled at 31 when checked in 01/2024. Continue Atorvastatin  10 mg daily.  4. Dilated aortic root (HCC) - Recent echocardiogram in 03/2024 showed his aortic root was mildly dilated at 40 mm and he  did have mild dilatation of the ascending aorta at 41 mm. Would plan for follow-up imaging in 1 year for reassessment.  Signed, Laymon CHRISTELLA Qua, PA-C

## 2024-03-27 NOTE — Progress Notes (Unsigned)
 Cardiology Office Note    Date:  04/02/2024  ID:  Seth [name], DOB [birth year], MRN [id number] Cardiologist: Diannah SHAUNNA Maywood, MD    History of Present Illness:    Seth [name] is a 68 y.o. male with past medical history of atrial fibrillation/flutter, orthostatic hypotension, HLD and dilatation of the aortic root who presents to the office today for 3-week follow-up.  He was examined by myself on 03/11/2024 following a recent hospitalization for left knee arthroplasty. He did have postoperative atrial fibrillation during that time and was started on Amiodarone  and Eliquis  once cleared to begin per orthopedics. At the time of his office visit, he denied any chest pain or palpitations and his breathing had been stable.  He reported compliance with Eliquis  and Amiodarone . By review of prior EKG's, it appeared that he had actually been in atrial flutter at his preoperative appointment on 02/22/2024. Was recommended to arrange for close follow-up and if he remained in atrial fibrillation/flutter, would arrange for DCCV once he completed 4 weeks of uninterrupted anticoagulation. Reviewed with Dr. Mallipeddi who was in agreement. Was unable to be started on AV nodal blocking agents given his BP at 102/60 during his office visit.  His physical therapist did reach out on 03/20/2024 as he had been tachycardic and hypotensive with BP as low as 86/64.  He was offered a visit at another office location but declined. Reported that he had not been consuming water  and was encouraged to increase his fluid intake to 2L per day.   In talking with the patient and his friend today, he denies any recent chest pain or palpitations. Does report occasional dizziness but this improves when he takes his Amiodarone  in the evening hours. BP was initially recorded at 82/47 during today's visit and at 94/60 on recheck (SBP further improved into the low 100's after he consumed water ). Does report dizziness with positional  changes but no symptoms while sitting still.  No specific orthopnea, PND or pitting edema. Reports PT is overall going well and he is progressing quicker than expected. Unfortunately, he has missed some doses of Eliquis  over the past few weeks.  Studies Reviewed:   EKG: EKG is ordered today and demonstrates:   EKG Interpretation Date/Time:  Wednesday April 02 2024 08:48:21 EDT Ventricular Rate:  121 PR Interval:    QRS Duration:  152 QT Interval:  386 QTC Calculation: 548 R Axis:   6  Text Interpretation: Atrial flutter with RVR Right bundle branch block Confirmed by Johnson Grate ([id number]) on 04/02/2024 8:49:17 AM       Echocardiogram: 03/2024 IMPRESSIONS     1. Left ventricular ejection fraction, by estimation, is 55 to 60%. The  left ventricle has normal function. The left ventricle has no regional  wall motion abnormalities. There is mild concentric left ventricular  hypertrophy. Left ventricular diastolic  function could not be evaluated.   2. Right ventricular systolic function is normal. The right ventricular  size is normal. There is normal pulmonary artery systolic pressure.   3. Left atrial size was mild to moderately dilated.   4. The mitral valve is normal in structure. No evidence of mitral valve  regurgitation. No evidence of mitral stenosis.   5. The aortic valve is normal in structure. Aortic valve regurgitation is  not visualized. No aortic stenosis is present.   6. Aortic dilatation noted. There is mild dilatation of the aortic root,  measuring 40 mm. There is mild dilatation of the ascending aorta,  measuring 41 mm.   7. The inferior vena cava is normal in size with greater than 50%  respiratory variability, suggesting right atrial pressure of 3 mmHg.   Risk Assessment/Calculations:    CHA2DS2-VASc Score = 4   This indicates a 4.8% annual risk of stroke. The patient's score is based upon: CHF History: 0 HTN History: 0 Diabetes History: 1 Stroke  History: 2 Vascular Disease History: 0 Age Score: 1 Gender Score: 0    Physical Exam:   VS:  BP 94/60   Pulse 91   Wt 281 lb (127.5 kg)   BMI 38.11 kg/m    Wt Readings from Last 3 Encounters:  04/02/24 281 lb (127.5 kg)  03/11/24 282 lb (127.9 kg)  03/03/24 281 lb (127.5 kg)     GEN: Well nourished, well developed in no acute distress NECK: No JVD; No carotid bruits CARDIAC: Irregularly irregular, no murmurs, rubs, gallops RESPIRATORY:  Clear to auscultation without rales, wheezing or rhonchi  ABDOMEN: Appears non-distended. No obvious abdominal masses. EXTREMITIES: No clubbing or cyanosis. No pitting edema.  Distal pedal pulses are 2+ bilaterally.   Assessment and Plan:   1. Persistent atrial fibrillation/flutter - Was initially diagnosed as having postoperative atrial fibrillation/flutter but by review of prior EKG's, it appears he had this prior to his surgery in 01/2024. He is still in atrial flutter with RVR by review of EKG today. Unfortunately, he has missed doses of Eliquis  as discussed above. Reviewed the option of TEE/DCCV with the patient to expedite this given his tachycardia and soft BP and he prefers to hold off on TEE. We also reviewed the option of ED evaluation and he reports he would never want to go to the ED.  Therefore, will arrange for DCCV after 04/22/2024 as this would allow for a minimum of 3 weeks of uninterrupted anticoagulation. I encouraged him to set an alarm on his phone to remember his doses. Will need to hold Mounjaro  one week prior to his procedure. We also reviewed warning signs to monitor for which would be concerning for acute heart failure in the interim. For now, continue Amiodarone  200 mg daily. Unable to add AV nodal blocking agents given soft BP. If he does not covert to NSR or has quick recurrence, would need to refer to EP.  - Continue Eliquis  5 mg twice daily for anticoagulation which is the appropriate dose given his current age, weight and  renal function.  Informed Consent   Shared Decision Making/Informed Consent The risks (stroke, cardiac arrhythmias rarely resulting in the need for a temporary or permanent pacemaker, skin irritation or burns and complications associated with conscious sedation including aspiration, arrhythmia, respiratory failure and death), benefits (restoration of normal sinus rhythm) and alternatives of a direct current cardioversion were explained in detail to Seth [name] and he agrees to proceed.   2. Orthostatic hypotension - BP was initially soft at 82/47 during today's visit and had improved to 94/60 on recheck.  He received water  during office visit with improvement as well as he reported not consuming water  prior to his visit  If BP remains soft, can consider the use of Midodrine but he reports it has overall been well-controlled with SBP in the 120's most of the time.   3. Mixed hyperlipidemia - LDL was well-controlled at 31 when checked in 01/2024. Continue Atorvastatin  10 mg daily.  4. Dilated aortic root (HCC) - Recent echocardiogram in 03/2024 showed his aortic root was mildly dilated at 40 mm and he  did have mild dilatation of the ascending aorta at 41 mm. Would plan for follow-up imaging in 1 year for reassessment.  Signed, Laymon CHRISTELLA Qua, PA-C

## 2024-04-01 ENCOUNTER — Ambulatory Visit (HOSPITAL_COMMUNITY)

## 2024-04-01 ENCOUNTER — Ambulatory Visit (HOSPITAL_COMMUNITY): Attending: Orthopedic Surgery | Admitting: Physical Therapy

## 2024-04-01 DIAGNOSIS — M25662 Stiffness of left knee, not elsewhere classified: Secondary | ICD-10-CM | POA: Insufficient documentation

## 2024-04-01 DIAGNOSIS — Z96652 Presence of left artificial knee joint: Secondary | ICD-10-CM | POA: Diagnosis not present

## 2024-04-01 DIAGNOSIS — R29898 Other symptoms and signs involving the musculoskeletal system: Secondary | ICD-10-CM | POA: Insufficient documentation

## 2024-04-01 NOTE — Therapy (Signed)
 OUTPATIENT PHYSICAL THERAPY LOWER EXTREMITY TREATMENT   Patient Name: Seth [name] MRN: [id number] DOB:05-07-1956, 68 y.o., male Today's Date: 04/01/2024  END OF SESSION:  PT End of Session - 04/01/24 1215     Visit Number 3    Number of Visits 10    Date for PT Re-Evaluation 04/22/24    Authorization Type [employer] PPO    Authorization Time Period cohere approved 10 visits 6/26-7/22    Authorization - Visit Number 3    Authorization - Number of Visits 10    Progress Note Due on Visit 10    PT Start Time 1109    PT Stop Time 1151    PT Time Calculation (min) 42 min    Activity Tolerance Patient tolerated treatment well    Behavior During Therapy WFL for tasks assessed/performed            Past Medical History:  Diagnosis Date   Anxiety    Brain bleed (HCC)    Intracerebral bleed - [employer] 2013   Bronchitis    Depression    Diabetes mellitus without complication (HCC)    History of kidney stones    Hypertension    Osteoarthritis of both knees    Pre-diabetes    Sleep apnea    dx. at one time, then they said no- mild, no cpap   Transient ischemic attack (TIA)    Past Surgical History:  Procedure Laterality Date   ACHILLES TENDON SURGERY Right 1994   BARIATRIC SURGERY     2005   cataracts Bilateral 2008   CHOLECYSTECTOMY     COLONOSCOPY  2015   Outside facility:  medium-sized lipoma in transverse colon.    EXTRACORPOREAL SHOCK WAVE LITHOTRIPSY Left 06/13/2018   Procedure: LEFT EXTRACORPOREAL SHOCK WAVE LITHOTRIPSY (ESWL) WITH MAC;  Surgeon: Watt Rush, MD;  Location: [facility];  Service: Urology;  Laterality: Left;   repair of wrist fx Right    SPINAL CORD STIMULATOR INSERTION N/A 12/01/2020   Procedure: Lumbar spinal cord stimulator placement;  Surgeon: Darlis Deatrice RAMAN, MD;  Location: [facility];  Service: Neurosurgery;  Laterality: N/A;   TONSILLECTOMY     TOTAL KNEE ARTHROPLASTY Left 03/03/2024   Procedure: ARTHROPLASTY, KNEE, TOTAL;  Surgeon:  Edna Toribio LABOR, MD;  Location: [facility];  Service: Orthopedics;  Laterality: Left;   Patient Active Problem List   Diagnosis Date Noted   Primary osteoarthritis of left knee 03/03/2024   OSA (obstructive sleep apnea) 05/28/2023   Orthostatic hypotension 02/08/2023   Screening for colorectal cancer 09/02/2020   Knee pain 07/15/2020   Sciatica of right side 04/19/2017   TIA (transient ischemic attack) 04/19/2017   Adjustment disorder with anxious mood 03/23/2016   Opioid type dependence, continuous (HCC) 12/08/2015   Seizure prophylaxis 10/13/2015   Allergic rhinitis 12/17/2014   Morbid obesity (HCC) 05/06/2014   Anxiety 07/17/2012   Depression 07/17/2012   Essential hypertension 07/17/2012   Intraparenchymal hemorrhage of brain (HCC) 07/16/2012    PCP: Shona Rush PEDLAR, MD  REFERRING PROVIDER: Edna Toribio LABOR, MD  REFERRING DIAG: S/P Lt TKR (DOS 03/03/2024) needs to start on 03/06/2024  THERAPY DIAG:  History of total left knee replacement  Weakness of left lower extremity  Decreased range of motion (ROM) of left knee  Rationale for Evaluation and Treatment: Rehabilitation  ONSET DATE:  L TKR June 2nd, 2025  SUBJECTIVE:   SUBJECTIVE STATEMENT: Pt reporting 5/10 pain. Pt reporting some mild dizziness.    PERTINENT  HISTORY: A-fib  R knee pain, possible TKR needed  PAIN:  Are you having pain? Yes: NPRS scale: 5/10 Pain location: L knee joint Pain description: Constant throbbing Aggravating factors: Bending Relieving factors: Ice  PRECAUTIONS: None  RED FLAGS: None   WEIGHT BEARING RESTRICTIONS: No  FALLS:  Has patient fallen in last 6 months? No Near fall last week when he was dizzy  LIVING ENVIRONMENT: Lives with: lives with their family and lives with their son, lives with godson Lives in: House/apartment Stairs: Yes: Internal: 12 steps; on right going up and External: 4 steps; on right going up and on left going up Has following equipment at  home: Single point cane  OCCUPATION: Work for [employer], Driving during   PLOF: Independent  PATIENT GOALS: Just more range of movement and for this constant pain to get better  NEXT MD VISIT: July 2025  OBJECTIVE:  Note: Objective measures were completed at Evaluation unless otherwise noted.  DIAGNOSTIC FINDINGS:   PATIENT SURVEYS:  Lower Extremity Functional Score: 45 / 80 = 56.3 %  COGNITION: Overall cognitive status: Within functional limits for tasks assessed     SENSATION: WFL  EDEMA:  Minimal edema to note in LLE  POSTURE: rounded shoulders and forward head  PALPATION:   LOWER EXTREMITY ROM:  Active ROM Right eval Left eval  Hip flexion    Hip extension    Hip abduction    Hip adduction    Hip internal rotation    Hip external rotation    Knee flexion  100  Knee extension  -4  Ankle dorsiflexion    Ankle plantarflexion    Ankle inversion    Ankle eversion     (Blank rows = not tested)  LOWER EXTREMITY MMT:  MMT Right eval Left eval  Hip flexion 4 4  Hip extension    Hip abduction    Hip adduction    Hip internal rotation    Hip external rotation    Knee flexion 4+ 4-  Knee extension 4+ 4-  Ankle dorsiflexion 5 5  Ankle plantarflexion    Ankle inversion    Ankle eversion     (Blank rows = not tested)  LOWER EXTREMITY SPECIAL TESTS:   FUNCTIONAL TESTS:  30 seconds chair stand test 2 minute walk test: next session  30 sec chair stand test: 12 STS, knee just feels tired 04/01/24:  DGI completed 14/24 without AD  GAIT: Distance walked: 100 Assistive device utilized: Single point cane Level of assistance: Modified independence Comments: SPC on R, dec weight shift to LLE, dec step lengths, antalgic-like at times                                                                                                                                TREATMENT DATE:  04/01/24 Bike seat 17 full revolutions 4 minutes Standing  at //bars  12 step Lt knee flexion 10X10  Lt knee flexion 10X  Lateral step ups 4 2X10 with bil UE Lt leading only  Forward step ups 4 2X10 with bil UE Lt leading only AROM in supine 0-120 degrees Myofasical release to scar tissue perimeter of knee  DGI 1. Gait level surface (2) Mild Impairment: Walks 20', uses assistive devices, slower speed, mild gait deviations. 2. Change in gait speed (2) Mild Impairment: Is able to change speed but demonstrates mild gait deviations, or not gait deviations but unable to achieve a significant change in velocity, or uses an assistive device. 3. Gait with horizontal head turns (2) Mild Impairment: Performs head turns smoothly with slight change in gait velocity, i.e., minor disruption to smooth gait path or uses walking aid. 4. Gait with vertical head turns (2) Mild Impairment: Performs head turns smoothly with slight change in gait velocity, i.e., minor disruption to smooth gait path or uses walking aid. 5. Gait and pivot turn (1) Moderate Impairment: Turns slowly, requires verbal cueing, requires several small steps to catch balance following turn and stop. 6. Step over obstacle (1) Moderate Impairment: Is able to step over box but must stop, then step over. May require verbal cueing. 7. Step around obstacles (2) Mild Impairment: Is able to step around both cones, but must slow down and adjust steps to clear cones. 8. Stairs (2) Mild Impairment: Alternating feet, must use rail.  TOTAL SCORE: 14 / 24    03/27/2024  -BP: Unable to read, attempted manual unable to interpret at this time. SpO2: 97% HR 128 bpm -Seated knee flexion 10x10'' - : 259ft HR: 123 bpm SpO2: 95% - Standing knee drive on 87pw box 10 x 10''- 102 degrees -Seated hamstring stretch with cues for ankle DF 2x1' -Standing gastrocnemius stretch 1x1'    03/25/24: PT Eval and HEP    PATIENT EDUCATION:  Education details: PT evaluation, objective findings, POC, Importance  of HEP, Precautions, Clinic policies  Person educated: Patient and friend Education method: Medical illustrator Education comprehension: verbalized understanding and returned demonstration  HOME EXERCISE PROGRAM: Access Code: [email or web address] URL: [email or web address] Date: 03/25/2024 Prepared by: Rosaria Powell-Butler  Exercises - Seated Heel Slide  - 2 x daily - 7 x weekly - 3 sets - 10 reps - Seated Long Arc Quad  - 2 x daily - 7 x weekly - 3 sets - 10 reps - Sit to Stand  - 2 x daily - 7 x weekly - 3 sets - 10 reps - Clamshell  - 2 x daily - 7 x weekly - 3 sets - 10 reps - Standing Hip Extension with Counter Support  - 2 x daily - 7 x weekly - 3 sets - 10 reps - Heel Raises with Counter Support  - 2 x daily - 7 x weekly - 3 sets - 10 reps  ASSESSMENT:  CLINICAL IMPRESSION: Began session on bike today with ability to make full revolutions.  DGI completed as requested in last visit plan.  Pt scored 14/24 at this time indicating an increased risk of falls (under 19).  Pt urged to continue using his SPC until his LE become stronger/more stable.  Pt having more issues with Rt knee vs Lt.  Began step ups using 4 step with Lt only . AROM in supine improved to WNL today at 0-120.  Was only at 104 last visit.  Did note scar restrictions particularly in proximal scar that was improved with myofascial techniques.  Pt overall making great progress. Patient will benefit from skilled physical  therapy in order to address the above to return to [facility] and work duties.    OBJECTIVE IMPAIRMENTS: Abnormal gait, decreased activity tolerance, decreased balance, decreased endurance, decreased mobility, difficulty walking, decreased ROM, decreased strength, improper body mechanics, postural dysfunction, and pain.   ACTIVITY LIMITATIONS: lifting, bending, sitting, standing, squatting, stairs, and transfers  PARTICIPATION LIMITATIONS: cleaning, laundry, driving, community activity, and  occupation  PERSONAL FACTORS: N/A are also affecting patient's functional outcome.   REHAB POTENTIAL: Good  CLINICAL DECISION MAKING: Stable/uncomplicated  EVALUATION COMPLEXITY: Low   GOALS: Goals reviewed with patient? No  SHORT TERM GOALS: Target date: 04/08/24 Patient will be independent with performance of HEP to demonstrate adequate self management of symptoms.  Baseline:  Goal status: INITIAL  2.   Patient will report at least a 25% improvement with function or pain overall since beginning PT. Baseline:  Goal status: INITIAL   LONG TERM GOALS: Target date: 04/22/24 Patient will improve LEFS score by 9 points to demonstrate improved perceived function while meeting MCID.  Baseline: Goal status: INITIAL 2.  Patient will improve L knee flexion ROM to at least 120 degrees to show improved LE mobility for improve functional transfers and QOL.  Baseline:  Goal status: INITIAL 3.  Patient will improve L knee extension ROM to at least 0 degrees to show improved LE mobility for improve functional transfers and QOL.  Baseline:  Goal status: INITIAL   4.  Patient will improve 30 second chair stand test by at least 3 STS in order to demonstrate improved LE strength and endurance needed for community ambulation.  Baseline: Goal status: INITIAL    PLAN:  PT FREQUENCY: 1-2x/week  PT DURATION: 4 weeks  PLANNED INTERVENTIONS: 97164- PT Re-evaluation, 97110-Therapeutic exercises, 97530- Therapeutic activity, [id number]- Neuromuscular re-education, 97535- Self Care, [id number]- Manual therapy, [id number]- Gait training, [phone]- Electrical stimulation (manual), [id number]- Traction (mechanical), [phone] (1-2 muscles), 20561 (3+ muscles)- Dry Needling, Patient/Family education, Balance training, Stair training, Taping, Joint mobilization, Spinal mobilization, Scar mobilization, Cryotherapy, and Moist heat  PLAN FOR NEXT SESSION:  Increase functional strengthening, stair training and balance. AROM is now  WNL   Greig KATHEE Fuse, PTA/CLT [facility] Ph: [phone]  12:16 PM, 04/01/24

## 2024-04-02 ENCOUNTER — Encounter: Payer: Self-pay | Admitting: Student

## 2024-04-02 ENCOUNTER — Encounter: Payer: Self-pay | Admitting: *Deleted

## 2024-04-02 ENCOUNTER — Ambulatory Visit: Attending: Student | Admitting: Student

## 2024-04-02 VITALS — BP 94/60 | HR 91 | Ht 72.0 in | Wt 281.0 lb

## 2024-04-02 DIAGNOSIS — I77810 Thoracic aortic ectasia: Secondary | ICD-10-CM

## 2024-04-02 DIAGNOSIS — E782 Mixed hyperlipidemia: Secondary | ICD-10-CM | POA: Diagnosis not present

## 2024-04-02 DIAGNOSIS — I951 Orthostatic hypotension: Secondary | ICD-10-CM | POA: Diagnosis not present

## 2024-04-02 DIAGNOSIS — I4819 Other persistent atrial fibrillation: Secondary | ICD-10-CM | POA: Diagnosis not present

## 2024-04-02 NOTE — Patient Instructions (Signed)
 Medication Instructions:  Your physician recommends that you continue on your current medications as directed. Please refer to the Current Medication list given to you today.  Do Not Take Mounjaro  one week prior to your procedure.   Please take Eliquis  5 Two Times Daily   *If you need a refill on your cardiac medications before your next appointment, please call your pharmacy*  Lab Work: Your physician recommends that you return for lab work in: BMET, CBC   If you have labs (blood work) drawn today and your tests are completely normal, you will receive your results only by: MyChart Message (if you have MyChart) OR A paper copy in the mail If you have any lab test that is abnormal or we need to change your treatment, we will call you to review the results.  Testing/Procedures: Your physician has recommended that you have a Cardioversion (DCCV). Electrical Cardioversion uses a jolt of electricity to your heart either through paddles or wired patches attached to your chest. This is a controlled, usually prescheduled, procedure. Defibrillation is done under light anesthesia in the hospital, and you usually go home the day of the procedure. This is done to get your heart back into a normal rhythm. You are not awake for the procedure. Please see the instruction sheet given to you today.   Follow-Up: At [facility], you and your health needs are our priority.  As part of our continuing mission to provide you with exceptional heart care, our providers are all part of one team.  This team includes your primary Cardiologist (physician) and Advanced Practice Providers or APPs (Physician Assistants and Nurse Practitioners) who all work together to provide you with the care you need, when you need it.  Your next appointment:    August   Provider:   Laymon Qua, PA-C    We recommend signing up for the patient portal called MyChart.  Sign up information is provided on this After Visit  Summary.  MyChart is used to connect with patients for Virtual Visits (Telemedicine).  Patients are able to view lab/test results, encounter notes, upcoming appointments, etc.  Non-urgent messages can be sent to your provider as well.   To learn more about what you can do with MyChart, go to [email or web address].   Other Instructions Thank you for choosing [facility]!

## 2024-04-07 ENCOUNTER — Encounter (HOSPITAL_COMMUNITY): Admitting: Physical Therapy

## 2024-04-07 ENCOUNTER — Other Ambulatory Visit (HOSPITAL_BASED_OUTPATIENT_CLINIC_OR_DEPARTMENT_OTHER): Payer: Self-pay

## 2024-04-07 MED ORDER — LIDOCAINE 5 % EX PTCH
1.0000 | MEDICATED_PATCH | CUTANEOUS | 0 refills | Status: AC
  Filled 2024-04-07: qty 15, 5d supply, fill #0

## 2024-04-08 DIAGNOSIS — M79674 Pain in right toe(s): Secondary | ICD-10-CM | POA: Diagnosis not present

## 2024-04-08 DIAGNOSIS — B351 Tinea unguium: Secondary | ICD-10-CM | POA: Diagnosis not present

## 2024-04-08 DIAGNOSIS — M79675 Pain in left toe(s): Secondary | ICD-10-CM | POA: Diagnosis not present

## 2024-04-09 ENCOUNTER — Encounter (HOSPITAL_COMMUNITY): Payer: Self-pay

## 2024-04-09 ENCOUNTER — Ambulatory Visit (HOSPITAL_COMMUNITY)

## 2024-04-09 DIAGNOSIS — Z96652 Presence of left artificial knee joint: Secondary | ICD-10-CM

## 2024-04-09 DIAGNOSIS — R29898 Other symptoms and signs involving the musculoskeletal system: Secondary | ICD-10-CM

## 2024-04-09 DIAGNOSIS — M25662 Stiffness of left knee, not elsewhere classified: Secondary | ICD-10-CM | POA: Diagnosis not present

## 2024-04-09 NOTE — Therapy (Signed)
 OUTPATIENT PHYSICAL THERAPY LOWER EXTREMITY TREATMENT   Patient Name: Seth [name] MRN: [id number] DOB:11-14-1955, 68 y.o., male Today's Date: 04/09/2024  END OF SESSION:  PT End of Session - 04/09/24 0853     Visit Number 4    Number of Visits 10    Date for PT Re-Evaluation 04/22/24    Authorization Type [employer] Choice PPO    Authorization Time Period cohere approved 10 visits 6/26-7/22    Authorization - Visit Number 4    Authorization - Number of Visits 10    Progress Note Due on Visit 10    PT Start Time 0849    PT Stop Time 0925    PT Time Calculation (min) 36 min    Activity Tolerance Treatment limited secondary to medical complications (Comment)    Behavior During Therapy [facility] for tasks assessed/performed             Past Medical History:  Diagnosis Date   Anxiety    Brain bleed (HCC)    Intracerebral bleed - [employer] 2013   Bronchitis    Depression    Diabetes mellitus without complication (HCC)    History of kidney stones    Hypertension    Osteoarthritis of both knees    Pre-diabetes    Sleep apnea    dx. at one time, then they said no- mild, no cpap   Transient ischemic attack (TIA)    Past Surgical History:  Procedure Laterality Date   ACHILLES TENDON SURGERY Right 1994   BARIATRIC SURGERY     2005   cataracts Bilateral 2008   CHOLECYSTECTOMY     COLONOSCOPY  2015   Outside facility:  medium-sized lipoma in transverse colon.    EXTRACORPOREAL SHOCK WAVE LITHOTRIPSY Left 06/13/2018   Procedure: LEFT EXTRACORPOREAL SHOCK WAVE LITHOTRIPSY (ESWL) WITH MAC;  Surgeon: Watt Rush, MD;  Location: [facility];  Service: Urology;  Laterality: Left;   repair of wrist fx Right    SPINAL CORD STIMULATOR INSERTION N/A 12/01/2020   Procedure: Lumbar spinal cord stimulator placement;  Surgeon: Darlis Deatrice RAMAN, MD;  Location: [facility] OR;  Service: Neurosurgery;  Laterality: N/A;   TONSILLECTOMY     TOTAL KNEE ARTHROPLASTY Left 03/03/2024   Procedure:  ARTHROPLASTY, KNEE, TOTAL;  Surgeon: Edna Toribio LABOR, MD;  Location: [facility];  Service: Orthopedics;  Laterality: Left;   Patient Active Problem List   Diagnosis Date Noted   Primary osteoarthritis of left knee 03/03/2024   OSA (obstructive sleep apnea) 05/28/2023   Orthostatic hypotension 02/08/2023   Screening for colorectal cancer 09/02/2020   Knee pain 07/15/2020   Sciatica of right side 04/19/2017   TIA (transient ischemic attack) 04/19/2017   Adjustment disorder with anxious mood 03/23/2016   Opioid type dependence, continuous (HCC) 12/08/2015   Seizure prophylaxis 10/13/2015   Allergic rhinitis 12/17/2014   Morbid obesity (HCC) 05/06/2014   Anxiety 07/17/2012   Depression 07/17/2012   Essential hypertension 07/17/2012   Intraparenchymal hemorrhage of brain (HCC) 07/16/2012    PCP: Shona Rush PEDLAR, MD  REFERRING PROVIDER: Edna Toribio LABOR, MD  REFERRING DIAG: S/P Lt TKR (DOS 03/03/2024) needs to start on 03/06/2024  THERAPY DIAG:  History of total left knee replacement  Weakness of left lower extremity  Decreased range of motion (ROM) of left knee  Rationale for Evaluation and Treatment: Rehabilitation  ONSET DATE:  L TKR June 2nd, 2025  SUBJECTIVE:   SUBJECTIVE STATEMENT: Mild dizziness this morning and pain today. Pt reporting  compliance with HEP.    PERTINENT HISTORY: A-fib  R knee pain, possible TKR needed  PAIN:  Are you having pain? Yes: NPRS scale: 5/10 Pain location: L knee joint Pain description: Constant throbbing Aggravating factors: Bending Relieving factors: Ice  PRECAUTIONS: None  RED FLAGS: None   WEIGHT BEARING RESTRICTIONS: No  FALLS:  Has patient fallen in last 6 months? No Near fall last week when he was dizzy  LIVING ENVIRONMENT: Lives with: lives with their family and lives with their son, lives with godson Lives in: House/apartment Stairs: Yes: Internal: 12 steps; on right going up and External: 4 steps; on right  going up and on left going up Has following equipment at home: Single point cane  OCCUPATION: Work for [employer], Driving during   PLOF: Independent  PATIENT GOALS: Just more range of movement and for this constant pain to get better  NEXT MD VISIT: July 2025  OBJECTIVE:  Note: Objective measures were completed at Evaluation unless otherwise noted.  DIAGNOSTIC FINDINGS:   PATIENT SURVEYS:  Lower Extremity Functional Score: 45 / 80 = 56.3 %  COGNITION: Overall cognitive status: Within functional limits for tasks assessed     SENSATION: WFL  EDEMA:  Minimal edema to note in LLE  POSTURE: rounded shoulders and forward head  PALPATION:   LOWER EXTREMITY ROM:  Active ROM Right eval Left eval Left 04/09/2024  Hip flexion     Hip extension     Hip abduction     Hip adduction     Hip internal rotation     Hip external rotation     Knee flexion  100 120  Knee extension  -4 0  Ankle dorsiflexion     Ankle plantarflexion     Ankle inversion     Ankle eversion      (Blank rows = not tested)  LOWER EXTREMITY MMT:  MMT Right eval Left eval  Hip flexion 4 4  Hip extension    Hip abduction    Hip adduction    Hip internal rotation    Hip external rotation    Knee flexion 4+ 4-  Knee extension 4+ 4-  Ankle dorsiflexion 5 5  Ankle plantarflexion    Ankle inversion    Ankle eversion     (Blank rows = not tested)  LOWER EXTREMITY SPECIAL TESTS:   FUNCTIONAL TESTS:  30 seconds chair stand test 2 minute walk test: next session  30 sec chair stand test: 12 STS, knee just feels tired 04/01/24:  DGI completed 14/24 without AD  GAIT: Distance walked: 100 Assistive device utilized: Single point cane Level of assistance: Modified independence Comments: SPC on R, dec weight shift to LLE, dec step lengths, antalgic-like at times  TREATMENT DATE:  04/09/2024  -Bike seat 14 x 3'' full revolutions -Standing HS stretch on 12in box 2 x 1' -Standing knee drive 2 x 1' on 87pw box -ROM today 0-120 -BP: 78/58 121 Bpm After 5 minutes with water  and snack -BP: 86/60 117 Bpm  04/01/24 Bike seat 17 full revolutions 4 minutes Standing at //bars  12 step Lt knee flexion 10X10  Lt knee flexion 10X  Lateral step ups 4 2X10 with bil UE Lt leading only  Forward step ups 4 2X10 with bil UE Lt leading only AROM in supine 0-120 degrees Myofasical release to scar tissue perimeter of knee  DGI 1. Gait level surface (2) Mild Impairment: Walks 20', uses assistive devices, slower speed, mild gait deviations. 2. Change in gait speed (2) Mild Impairment: Is able to change speed but demonstrates mild gait deviations, or not gait deviations but unable to achieve a significant change in velocity, or uses an assistive device. 3. Gait with horizontal head turns (2) Mild Impairment: Performs head turns smoothly with slight change in gait velocity, i.e., minor disruption to smooth gait path or uses walking aid. 4. Gait with vertical head turns (2) Mild Impairment: Performs head turns smoothly with slight change in gait velocity, i.e., minor disruption to smooth gait path or uses walking aid. 5. Gait and pivot turn (1) Moderate Impairment: Turns slowly, requires verbal cueing, requires several small steps to catch balance following turn and stop. 6. Step over obstacle (1) Moderate Impairment: Is able to step over box but must stop, then step over. May require verbal cueing. 7. Step around obstacles (2) Mild Impairment: Is able to step around both cones, but must slow down and adjust steps to clear cones. 8. Stairs (2) Mild Impairment: Alternating feet, must use rail.  TOTAL SCORE: 14 / 24    03/27/2024  -BP: Unable to read, attempted manual unable to interpret at this time. SpO2: 97% HR 128 bpm -Seated knee flexion  10x10'' - : 216ft HR: 123 bpm SpO2: 95% - Standing knee drive on 87pw box 10 x 10''- 102 degrees -Seated hamstring stretch with cues for ankle DF 2x1' -Standing gastrocnemius stretch 1x1'    03/25/24: PT Eval and HEP    PATIENT EDUCATION:  Education details: PT evaluation, objective findings, POC, Importance of HEP, Precautions, Clinic policies  Person educated: Patient and friend Education method: Medical illustrator Education comprehension: verbalized understanding and returned demonstration  HOME EXERCISE PROGRAM: Access Code: [email or web address] URL: [email or web address] Date: 03/25/2024 Prepared by: Rosaria Powell-Butler  Exercises - Seated Heel Slide  - 2 x daily - 7 x weekly - 3 sets - 10 reps - Seated Long Arc Quad  - 2 x daily - 7 x weekly - 3 sets - 10 reps - Sit to Stand  - 2 x daily - 7 x weekly - 3 sets - 10 reps - Clamshell  - 2 x daily - 7 x weekly - 3 sets - 10 reps - Standing Hip Extension with Counter Support  - 2 x daily - 7 x weekly - 3 sets - 10 reps - Heel Raises with Counter Support  - 2 x daily - 7 x weekly - 3 sets - 10 reps  ASSESSMENT:  CLINICAL IMPRESSION: Pt tolerating therapy session well except for increased dizziness this session. ROM continues to maintain 0-120, strength is primary goal and noted balance deficits on DGI. Pt educated importance of proper nutrition and hydration due to decreased BP with mobility during sessions. Session terminated early  due to hypotensive and tachycardia. Patient will benefit from skilled physical therapy in order to address the above to return to [facility] and work duties.    OBJECTIVE IMPAIRMENTS: Abnormal gait, decreased activity tolerance, decreased balance, decreased endurance, decreased mobility, difficulty walking, decreased ROM, decreased strength, improper body mechanics, postural dysfunction, and pain.   ACTIVITY LIMITATIONS: lifting, bending, sitting, standing, squatting, stairs, and  transfers  PARTICIPATION LIMITATIONS: cleaning, laundry, driving, community activity, and occupation  PERSONAL FACTORS: N/A are also affecting patient's functional outcome.   REHAB POTENTIAL: Good  CLINICAL DECISION MAKING: Stable/uncomplicated  EVALUATION COMPLEXITY: Low   GOALS: Goals reviewed with patient? No  SHORT TERM GOALS: Target date: 04/08/24 Patient will be independent with performance of HEP to demonstrate adequate self management of symptoms.  Baseline:  Goal status: INITIAL  2.   Patient will report at least a 25% improvement with function or pain overall since beginning PT. Baseline:  Goal status: INITIAL   LONG TERM GOALS: Target date: 04/22/24 Patient will improve LEFS score by 9 points to demonstrate improved perceived function while meeting MCID.  Baseline: Goal status: INITIAL 2.  Patient will improve L knee flexion ROM to at least 120 degrees to show improved LE mobility for improve functional transfers and QOL.  Baseline:  Goal status: INITIAL 3.  Patient will improve L knee extension ROM to at least 0 degrees to show improved LE mobility for improve functional transfers and QOL.  Baseline:  Goal status: INITIAL   4.  Patient will improve 30 second chair stand test by at least 3 STS in order to demonstrate improved LE strength and endurance needed for community ambulation.  Baseline: Goal status: INITIAL    PLAN:  PT FREQUENCY: 1-2x/week  PT DURATION: 4 weeks  PLANNED INTERVENTIONS: 97164- PT Re-evaluation, 97110-Therapeutic exercises, 97530- Therapeutic activity, [id number]- Neuromuscular re-education, 97535- Self Care, [id number]- Manual therapy, [id number]- Gait training, [phone]- Electrical stimulation (manual), [id number]- Traction (mechanical), [phone] (1-2 muscles), [id number] (3+ muscles)- Dry Needling, Patient/Family education, Balance training, Stair training, Taping, Joint mobilization, Spinal mobilization, Scar mobilization, Cryotherapy, and Moist heat  PLAN FOR  NEXT SESSION:  Increase functional strengthening, stair training and balance. AROM is now WNL  Omega JONETTA Bottcher PT, DPT [facility]-  [phone] office  9:27 AM, 04/09/24

## 2024-04-10 DIAGNOSIS — M1712 Unilateral primary osteoarthritis, left knee: Secondary | ICD-10-CM | POA: Diagnosis not present

## 2024-04-15 ENCOUNTER — Encounter (HOSPITAL_COMMUNITY): Admitting: Physical Therapy

## 2024-04-17 ENCOUNTER — Ambulatory Visit (HOSPITAL_COMMUNITY)

## 2024-04-17 DIAGNOSIS — R29898 Other symptoms and signs involving the musculoskeletal system: Secondary | ICD-10-CM

## 2024-04-17 DIAGNOSIS — Z96652 Presence of left artificial knee joint: Secondary | ICD-10-CM | POA: Diagnosis not present

## 2024-04-17 DIAGNOSIS — M25662 Stiffness of left knee, not elsewhere classified: Secondary | ICD-10-CM | POA: Diagnosis not present

## 2024-04-17 NOTE — Therapy (Addendum)
 OUTPATIENT PHYSICAL THERAPY LOWER EXTREMITY TREATMENT/PROGRESS NOTE   Patient Name: Seth [name] MRN: [id number] DOB:1956-03-09, 68 y.o., male Today's Date: 04/17/2024   Progress Note Reporting Period 03/25/24 to 04/17/24  See note below for Objective Data and Assessment of Progress/Goals.      END OF SESSION:  PT End of Session - 04/17/24 1157     Visit Number 5    Number of Visits 10    Date for PT Re-Evaluation 05/01/24    Authorization Type [employer] Choice PPO    Authorization Time Period cohere approved 10 visits 6/26-7/22    Authorization - Number of Visits 10    Progress Note Due on Visit 10    PT Start Time 1154    PT Stop Time 1232    PT Time Calculation (min) 38 min    Activity Tolerance Patient tolerated treatment well    Behavior During Therapy WFL for tasks assessed/performed             Past Medical History:  Diagnosis Date   Anxiety    Brain bleed (HCC)    Intracerebral bleed - [employer] 2013   Bronchitis    Depression    Diabetes mellitus without complication (HCC)    History of kidney stones    Hypertension    Osteoarthritis of both knees    Pre-diabetes    Sleep apnea    dx. at one time, then they said no- mild, no cpap   Transient ischemic attack (TIA)    Past Surgical History:  Procedure Laterality Date   ACHILLES TENDON SURGERY Right 1994   BARIATRIC SURGERY     2005   cataracts Bilateral 2008   CHOLECYSTECTOMY     COLONOSCOPY  2015   Outside facility:  medium-sized lipoma in transverse colon.    EXTRACORPOREAL SHOCK WAVE LITHOTRIPSY Left 06/13/2018   Procedure: LEFT EXTRACORPOREAL SHOCK WAVE LITHOTRIPSY (ESWL) WITH MAC;  Surgeon: Watt Rush, MD;  Location: [facility];  Service: Urology;  Laterality: Left;   repair of wrist fx Right    SPINAL CORD STIMULATOR INSERTION N/A 12/01/2020   Procedure: Lumbar spinal cord stimulator placement;  Surgeon: Darlis Deatrice RAMAN, MD;  Location: [facility] OR;  Service: Neurosurgery;  Laterality:  N/A;   TONSILLECTOMY     TOTAL KNEE ARTHROPLASTY Left 03/03/2024   Procedure: ARTHROPLASTY, KNEE, TOTAL;  Surgeon: Edna Toribio LABOR, MD;  Location: [facility];  Service: Orthopedics;  Laterality: Left;   Patient Active Problem List   Diagnosis Date Noted   Primary osteoarthritis of left knee 03/03/2024   OSA (obstructive sleep apnea) 05/28/2023   Orthostatic hypotension 02/08/2023   Screening for colorectal cancer 09/02/2020   Knee pain 07/15/2020   Sciatica of right side 04/19/2017   TIA (transient ischemic attack) 04/19/2017   Adjustment disorder with anxious mood 03/23/2016   Opioid type dependence, continuous (HCC) 12/08/2015   Seizure prophylaxis 10/13/2015   Allergic rhinitis 12/17/2014   Morbid obesity (HCC) 05/06/2014   Anxiety 07/17/2012   Depression 07/17/2012   Essential hypertension 07/17/2012   Intraparenchymal hemorrhage of brain (HCC) 07/16/2012    PCP: Shona Rush PEDLAR, MD  REFERRING PROVIDER: Edna Toribio LABOR, MD  REFERRING DIAG: S/P Lt TKR (DOS 03/03/2024) needs to start on 03/06/2024  THERAPY DIAG:  History of total left knee replacement  Weakness of left lower extremity  Decreased range of motion (ROM) of left knee  Rationale for Evaluation and Treatment: Rehabilitation  ONSET DATE:  L TKR June 2nd, 2025  SUBJECTIVE:   SUBJECTIVE STATEMENT: Pt reports little pain this morning in L knee but reports R knee is starting to hurt him more. Believes its due to compensating. Reports every day compliance with HEP. Reports he has a cardiac ablation scheduled for next Wednesday. (Told to call and cancel Thursdays appointment in advance if needed)    PERTINENT HISTORY: A-fib  R knee pain, possible TKR needed  PAIN:  Are you having pain? Yes: NPRS scale: 5/10 Pain location: L knee joint Pain description: Constant throbbing Aggravating factors: Bending Relieving factors: Ice  PRECAUTIONS: None  RED FLAGS: None   WEIGHT BEARING RESTRICTIONS:  No  FALLS:  Has patient fallen in last 6 months? No Near fall last week when he was dizzy  LIVING ENVIRONMENT: Lives with: lives with their family and lives with their son, lives with godson Lives in: House/apartment Stairs: Yes: Internal: 12 steps; on right going up and External: 4 steps; on right going up and on left going up Has following equipment at home: Single point cane  OCCUPATION: Work for [employer], Driving during   PLOF: Independent  PATIENT GOALS: Just more range of movement and for this constant pain to get better  NEXT MD VISIT: July 2025  OBJECTIVE:  Note: Objective measures were completed at Evaluation unless otherwise noted.  DIAGNOSTIC FINDINGS:   PATIENT SURVEYS:  Lower Extremity Functional Score: 45 / 80 = 56.3 %  04/17/24: Lower Extremity Functional Score: 51 / 80 = 63.7 %  COGNITION: Overall cognitive status: Within functional limits for tasks assessed     SENSATION: WFL  EDEMA:  Minimal edema to note in LLE  POSTURE: rounded shoulders and forward head  PALPATION:   LOWER EXTREMITY ROM:  Active ROM Right eval Left eval Left 04/09/2024 Left 04/17/24  Hip flexion      Hip extension      Hip abduction      Hip adduction      Hip internal rotation      Hip external rotation      Knee flexion  100 120 122  Knee extension  -4 0 0  Ankle dorsiflexion      Ankle plantarflexion      Ankle inversion      Ankle eversion       (Blank rows = not tested)  LOWER EXTREMITY MMT:  MMT Right eval Left eval Left 04/17/24  Hip flexion 4 4 4+  Hip extension     Hip abduction     Hip adduction     Hip internal rotation     Hip external rotation     Knee flexion 4+ 4- 4  Knee extension 4+ 4- 4  Ankle dorsiflexion 5 5 5   Ankle plantarflexion     Ankle inversion     Ankle eversion      (Blank rows = not tested)  LOWER EXTREMITY SPECIAL TESTS:   FUNCTIONAL TESTS:  30 seconds chair stand test 2 minute walk test:    30  sec chair stand test: 12 STS, knee just feels tired 04/01/24:  DGI completed 14/24 without AD  04/17/24: 30 second chair stand test: 11 STS, no UE use test: 305', no AD DGI: 13/24  GAIT: Distance walked: 100 Assistive device utilized: Single point cane Level of assistance: Modified independence Comments: SPC on R, dec weight shift to LLE, dec step lengths, antalgic-like at times  TREATMENT DATE:  04/17/24:  Recumbent bike, seat 14, 5', full revolutions Progress note: LEFS LE ROM LE MMT 30 second chair stand test 2 minute walk test  DGI 1. Gait level surface (2) Mild Impairment: Walks 20', uses assistive devices, slower speed, mild gait deviations. 2. Change in gait speed (2) Mild Impairment: Is able to change speed but demonstrates mild gait deviations, or not gait deviations but unable to achieve a significant change in velocity, or uses an assistive device. 3. Gait with horizontal head turns (2) Mild Impairment: Performs head turns smoothly with slight change in gait velocity, i.e., minor disruption to smooth gait path or uses walking aid. 4. Gait with vertical head turns (2) Mild Impairment: Performs head turns smoothly with slight change in gait velocity, i.e., minor disruption to smooth gait path or uses walking aid. 5. Gait and pivot turn (2) Mild Impairment: Pivot turns safely in > 3 seconds and stops with no loss of balance. 6. Step over obstacle (1) Moderate Impairment: Is able to step over box but must stop, then step over. May require verbal cueing. 7. Step around obstacles (1) Moderate Impairment: Is able to clear cones but must significantly slow, speed to accomplish task, or requires verbal cueing. 8. Stairs (1) Moderate Impairment: Two feet to a stair, must use rail.  TOTAL SCORE: 13 / 24   04/09/2024  -Bike seat 14 x 3'' full  revolutions -Standing HS stretch on 12in box 2 x 1' -Standing knee drive 2 x 1' on 87pw box -ROM today 0-120 -BP: 78/58 121 Bpm After 5 minutes with water  and snack -BP: 86/60 117 Bpm  04/01/24 Bike seat 17 full revolutions 4 minutes Standing at //bars  12 step Lt knee flexion 10X10  Lt knee flexion 10X  Lateral step ups 4 2X10 with bil UE Lt leading only  Forward step ups 4 2X10 with bil UE Lt leading only AROM in supine 0-120 degrees Myofasical release to scar tissue perimeter of knee DGI     PATIENT EDUCATION:  Education details: PT evaluation, objective findings, POC, Importance of HEP, Precautions, Clinic policies  Person educated: Patient and friend Education method: Medical illustrator Education comprehension: verbalized understanding and returned demonstration  HOME EXERCISE PROGRAM: Access Code: [email or web address] URL: [email or web address] Date: 03/25/2024 Prepared by: Rosaria Powell-Butler  Exercises - Seated Heel Slide  - 2 x daily - 7 x weekly - 3 sets - 10 reps - Seated Long Arc Quad  - 2 x daily - 7 x weekly - 3 sets - 10 reps - Sit to Stand  - 2 x daily - 7 x weekly - 3 sets - 10 reps - Clamshell  - 2 x daily - 7 x weekly - 3 sets - 10 reps - Standing Hip Extension with Counter Support  - 2 x daily - 7 x weekly - 3 sets - 10 reps - Heel Raises with Counter Support  - 2 x daily - 7 x weekly - 3 sets - 10 reps  ASSESSMENT:  CLINICAL IMPRESSION: Progress note performed this date. Patient demonstrates improvements with self perceived function, LE ROM, and LE MMT. Patient remains around similar levels with functional tests this date. Patient reports he feels he has made about 75% improvement since beginning PT and reports good compliance with HEP. Reports recent increased pain beginning in R knee. During 2 minute walk test, PT notices that pt tends to show increased weight bearing into RLE throughout gait cycle, slight correction with verbal  cueing.  Patient demonstrates most difficulty with balance, stepping over obstacles, and endurance which he will benefit from skilled physical therapy in order to address the above to return to PLOF and work duties.      OBJECTIVE IMPAIRMENTS: Abnormal gait, decreased activity tolerance, decreased balance, decreased endurance, decreased mobility, difficulty walking, decreased ROM, decreased strength, improper body mechanics, postural dysfunction, and pain.   ACTIVITY LIMITATIONS: lifting, bending, sitting, standing, squatting, stairs, and transfers  PARTICIPATION LIMITATIONS: cleaning, laundry, driving, community activity, and occupation  PERSONAL FACTORS: N/A are also affecting patient's functional outcome.   REHAB POTENTIAL: Good  CLINICAL DECISION MAKING: Stable/uncomplicated  EVALUATION COMPLEXITY: Low   GOALS: Goals reviewed with patient? No  SHORT TERM GOALS: Target date: 04/08/24 Patient will be independent with performance of HEP to demonstrate adequate self management of symptoms.  Baseline: reports everyday compliance on 04/17/24 Goal status: MET  2.   Patient will report at least a 25% improvement with function or pain overall since beginning PT. Baseline: 75% on 04/17/24 Goal status: MET   LONG TERM GOALS: Target date: 04/29/24 Patient will improve LEFS score by 9 points to demonstrate improved perceived function while meeting MCID.  Baseline: Goal status: IN PROGRESS 2.  Patient will improve L knee flexion ROM to at least 120 degrees to show improved LE mobility for improve functional transfers and QOL.  Baseline:  Goal status: MET 3.  Patient will improve L knee extension ROM to at least 0 degrees to show improved LE mobility for improve functional transfers and QOL.  Baseline:  Goal status: MET   4.  Patient will improve 30 second chair stand test by at least 3 STS in order to demonstrate improved LE strength and endurance needed for community ambulation.   Baseline: Goal status: IN PROGRESS    PLAN:  PT FREQUENCY: 1-2x/week  PT DURATION: 4 weeks  PLANNED INTERVENTIONS: 97164- PT Re-evaluation, 97110-Therapeutic exercises, 97530- Therapeutic activity, [id number]- Neuromuscular re-education, 97535- Self Care, 02859- Manual therapy, [id number]- Gait training, [phone]- Electrical stimulation (manual), [id number]- Traction (mechanical), [phone] (1-2 muscles), 20561 (3+ muscles)- Dry Needling, Patient/Family education, Balance training, Stair training, Taping, Joint mobilization, Spinal mobilization, Scar mobilization, Cryotherapy, and Moist heat  PLAN FOR NEXT SESSION:  Increase functional strengthening, stair training and balance. AROM is now WNL   2:28 PM, 04/17/24 Rosaria Settler, PT, DPT [facility] [place]

## 2024-04-18 ENCOUNTER — Telehealth: Payer: Self-pay | Admitting: Student

## 2024-04-18 NOTE — Telephone Encounter (Signed)
   Called and spoke to the patient in regards to his upcoming cardioversion next week. He says he has been compliant with Eliquis  and is taking this twice daily and has not missed any doses since his office visit. He does report that his Kardia device is saying he is in normal sinus rhythm but his heart rate has still been in the low 100's. I encouraged him to send strips for review via MyChart if able but suspect he is still in atrial flutter given that his heart rate was previously in the 70's to 80's when in normal sinus rhythm. Says that his blood pressure has significantly improved since he stopped his muscle relaxer. He has also been holding Mounjaro  since last week.  Signed, Laymon CHRISTELLA Qua, PA-C 04/18/2024, 11:48 AM Pager: [phone]

## 2024-04-22 ENCOUNTER — Ambulatory Visit (HOSPITAL_COMMUNITY): Admitting: Physical Therapy

## 2024-04-22 DIAGNOSIS — R29898 Other symptoms and signs involving the musculoskeletal system: Secondary | ICD-10-CM

## 2024-04-22 DIAGNOSIS — M25662 Stiffness of left knee, not elsewhere classified: Secondary | ICD-10-CM

## 2024-04-22 DIAGNOSIS — Z96652 Presence of left artificial knee joint: Secondary | ICD-10-CM

## 2024-04-22 NOTE — Therapy (Signed)
 OUTPATIENT PHYSICAL THERAPY LOWER EXTREMITY TREATMENT Session terminated early; no charge today  Patient Name: Seth [name] MRN: [id number] DOB:1956/08/29, 68 y.o., male Today's Date: 04/22/2024     END OF SESSION:  PT End of Session - 04/22/24 0939     Visit Number 5   corrected as no charge for session today   Number of Visits 10    Date for PT Re-Evaluation 05/01/24    Authorization Type [employer] PPO    Authorization Time Period cohere approved 10 visits 6/26-7/22    Authorization - Visit Number 5    Authorization - Number of Visits 10    Progress Note Due on Visit 10    PT Start Time 0935    PT Stop Time 1000    PT Time Calculation (min) 25 min    Activity Tolerance Patient tolerated treatment well    Behavior During Therapy [facility] for tasks assessed/performed             Past Medical History:  Diagnosis Date   Anxiety    Brain bleed (HCC)    Intracerebral bleed - [employer] 2013   Bronchitis    Depression    Diabetes mellitus without complication (HCC)    History of kidney stones    Hypertension    Osteoarthritis of both knees    Pre-diabetes    Sleep apnea    dx. at one time, then they said no- mild, no cpap   Transient ischemic attack (TIA)    Past Surgical History:  Procedure Laterality Date   ACHILLES TENDON SURGERY Right 1994   BARIATRIC SURGERY     2005   cataracts Bilateral 2008   CHOLECYSTECTOMY     COLONOSCOPY  2015   Outside facility:  medium-sized lipoma in transverse colon.    EXTRACORPOREAL SHOCK WAVE LITHOTRIPSY Left 06/13/2018   Procedure: LEFT EXTRACORPOREAL SHOCK WAVE LITHOTRIPSY (ESWL) WITH MAC;  Surgeon: Watt Rush, MD;  Location: [facility];  Service: Urology;  Laterality: Left;   repair of wrist fx Right    SPINAL CORD STIMULATOR INSERTION N/A 12/01/2020   Procedure: Lumbar spinal cord stimulator placement;  Surgeon: Darlis Deatrice RAMAN, MD;  Location: [facility] OR;  Service: Neurosurgery;  Laterality: N/A;   TONSILLECTOMY      TOTAL KNEE ARTHROPLASTY Left 03/03/2024   Procedure: ARTHROPLASTY, KNEE, TOTAL;  Surgeon: Edna Toribio LABOR, MD;  Location: [facility];  Service: Orthopedics;  Laterality: Left;   Patient Active Problem List   Diagnosis Date Noted   Primary osteoarthritis of left knee 03/03/2024   OSA (obstructive sleep apnea) 05/28/2023   Orthostatic hypotension 02/08/2023   Screening for colorectal cancer 09/02/2020   Knee pain 07/15/2020   Sciatica of right side 04/19/2017   TIA (transient ischemic attack) 04/19/2017   Adjustment disorder with anxious mood 03/23/2016   Opioid type dependence, continuous (HCC) 12/08/2015   Seizure prophylaxis 10/13/2015   Allergic rhinitis 12/17/2014   Morbid obesity (HCC) 05/06/2014   Anxiety 07/17/2012   Depression 07/17/2012   Essential hypertension 07/17/2012   Intraparenchymal hemorrhage of brain (HCC) 07/16/2012    PCP: Shona Rush PEDLAR, MD  REFERRING PROVIDER: Edna Toribio LABOR, MD  REFERRING DIAG: S/P Lt TKR (DOS 03/03/2024) needs to start on 03/06/2024  THERAPY DIAG:  History of total left knee replacement  Weakness of left lower extremity  Decreased range of motion (ROM) of left knee  Rationale for Evaluation and Treatment: Rehabilitation  ONSET DATE:  L TKR June 2nd, 2025  SUBJECTIVE:  SUBJECTIVE STATEMENT: Pt reports feeling a little dizzy today.  States his Rt knee is giving him a fit.  His Lt knee is doing great. PT has cardiac eversion scheduled for Friday.     PERTINENT HISTORY: A-fib  R knee pain, possible TKR needed  PAIN:  Are you having pain? Yes: NPRS scale: 5/10 Pain location: L knee joint Pain description: Constant throbbing Aggravating factors: Bending Relieving factors: Ice  PRECAUTIONS: None  RED FLAGS: None   WEIGHT BEARING RESTRICTIONS: No  FALLS:  Has patient fallen in last 6 months? No Near fall last week when he was dizzy  LIVING ENVIRONMENT: Lives with: lives with their family and lives with their  son, lives with godson Lives in: House/apartment Stairs: Yes: Internal: 12 steps; on right going up and External: 4 steps; on right going up and on left going up Has following equipment at home: Single point cane  OCCUPATION: Work for [employer], Driving during   PLOF: Independent  PATIENT GOALS: Just more range of movement and for this constant pain to get better  NEXT MD VISIT: July 2025  OBJECTIVE:  Note: Objective measures were completed at Evaluation unless otherwise noted.  DIAGNOSTIC FINDINGS:   PATIENT SURVEYS:  Lower Extremity Functional Score: 45 / 80 = 56.3 %  04/17/24: Lower Extremity Functional Score: 51 / 80 = 63.7 %  COGNITION: Overall cognitive status: Within functional limits for tasks assessed     SENSATION: WFL  EDEMA:  Minimal edema to note in LLE  POSTURE: rounded shoulders and forward head  PALPATION:   LOWER EXTREMITY ROM:  Active ROM Right eval Left eval Left 04/09/2024 Left 04/17/24  Hip flexion      Hip extension      Hip abduction      Hip adduction      Hip internal rotation      Hip external rotation      Knee flexion  100 120 122  Knee extension  -4 0 0  Ankle dorsiflexion      Ankle plantarflexion      Ankle inversion      Ankle eversion       (Blank rows = not tested)  LOWER EXTREMITY MMT:  MMT Right eval Left eval Left 04/17/24  Hip flexion 4 4 4+  Hip extension     Hip abduction     Hip adduction     Hip internal rotation     Hip external rotation     Knee flexion 4+ 4- 4  Knee extension 4+ 4- 4  Ankle dorsiflexion 5 5 5   Ankle plantarflexion     Ankle inversion     Ankle eversion      (Blank rows = not tested)  LOWER EXTREMITY SPECIAL TESTS:   FUNCTIONAL TESTS:  30 seconds chair stand test 2 minute walk test:    30 sec chair stand test: 12 STS, knee just feels tired 04/01/24:  DGI completed 14/24 without AD  04/17/24: 30 second chair stand test: 11 STS, no UE use test: 305', no  AD DGI: 13/24  GAIT: Distance walked: 100 Assistive device utilized: Single point cane Level of assistance: Modified independence Comments: SPC on R, dec weight shift to LLE, dec step lengths, antalgic-like at times  TREATMENT DATE:  04/22/24 Bike seat 14, 2 minutes only due to Rt knee pain had to stop BP: 94/69, HR 88bpm Exercise termination; instructions to monitor BP  04/17/24:  Recumbent bike, seat 14, 5', full revolutions Progress note: LEFS LE ROM LE MMT 30 second chair stand test 2 minute walk test  DGI 1. Gait level surface (2) Mild Impairment: Walks 20', uses assistive devices, slower speed, mild gait deviations. 2. Change in gait speed (2) Mild Impairment: Is able to change speed but demonstrates mild gait deviations, or not gait deviations but unable to achieve a significant change in velocity, or uses an assistive device. 3. Gait with horizontal head turns (2) Mild Impairment: Performs head turns smoothly with slight change in gait velocity, i.e., minor disruption to smooth gait path or uses walking aid. 4. Gait with vertical head turns (2) Mild Impairment: Performs head turns smoothly with slight change in gait velocity, i.e., minor disruption to smooth gait path or uses walking aid. 5. Gait and pivot turn (2) Mild Impairment: Pivot turns safely in > 3 seconds and stops with no loss of balance. 6. Step over obstacle (1) Moderate Impairment: Is able to step over box but must stop, then step over. May require verbal cueing. 7. Step around obstacles (1) Moderate Impairment: Is able to clear cones but must significantly slow, speed to accomplish task, or requires verbal cueing. 8. Stairs (1) Moderate Impairment: Two feet to a stair, must use rail.  TOTAL SCORE: 13 / 24   04/09/2024  -Bike seat 14 x 3'' full revolutions -Standing HS stretch  on 12in box 2 x 1' -Standing knee drive 2 x 1' on 87pw box -ROM today 0-120 -BP: 78/58 121 Bpm After 5 minutes with water  and snack -BP: 86/60 117 Bpm  04/01/24 Bike seat 17 full revolutions 4 minutes Standing at //bars  12 step Lt knee flexion 10X10  Lt knee flexion 10X  Lateral step ups 4 2X10 with bil UE Lt leading only  Forward step ups 4 2X10 with bil UE Lt leading only AROM in supine 0-120 degrees Myofasical release to scar tissue perimeter of knee DGI     PATIENT EDUCATION:  Education details: PT evaluation, objective findings, POC, Importance of HEP, Precautions, Clinic policies  Person educated: Patient and friend Education method: Medical illustrator Education comprehension: verbalized understanding and returned demonstration  HOME EXERCISE PROGRAM: Access Code: [email or web address] URL: [email or web address] Date: 03/25/2024 Prepared by: Rosaria Powell-Butler  Exercises - Seated Heel Slide  - 2 x daily - 7 x weekly - 3 sets - 10 reps - Seated Long Arc Quad  - 2 x daily - 7 x weekly - 3 sets - 10 reps - Sit to Stand  - 2 x daily - 7 x weekly - 3 sets - 10 reps - Clamshell  - 2 x daily - 7 x weekly - 3 sets - 10 reps - Standing Hip Extension with Counter Support  - 2 x daily - 7 x weekly - 3 sets - 10 reps - Heel Raises with Counter Support  - 2 x daily - 7 x weekly - 3 sets - 10 reps  ASSESSMENT:  CLINICAL IMPRESSION: Completed bike initially but had to stop at 2 minutes due to c/o Rt knee pain .  Shortly after pt c/o dizziness.  BP taken at 94/69 mmHg.  Discussed with rehab manager with decision to terminate activity today.  Pt instructed to monitor his BP and contact MD if continues.  Informed may  be best to wait to resume until he gets his cardioversion on Friday.  Pt is to call and cancel next appt if not feeling up to it. No billable therapy received today.     OBJECTIVE IMPAIRMENTS: Abnormal gait, decreased activity tolerance, decreased  balance, decreased endurance, decreased mobility, difficulty walking, decreased ROM, decreased strength, improper body mechanics, postural dysfunction, and pain.   ACTIVITY LIMITATIONS: lifting, bending, sitting, standing, squatting, stairs, and transfers  PARTICIPATION LIMITATIONS: cleaning, laundry, driving, community activity, and occupation  PERSONAL FACTORS: N/A are also affecting patient's functional outcome.   REHAB POTENTIAL: Good  CLINICAL DECISION MAKING: Stable/uncomplicated  EVALUATION COMPLEXITY: Low   GOALS: Goals reviewed with patient? No  SHORT TERM GOALS: Target date: 04/08/24 Patient will be independent with performance of HEP to demonstrate adequate self management of symptoms.  Baseline: reports everyday compliance on 04/17/24 Goal status: MET  2.   Patient will report at least a 25% improvement with function or pain overall since beginning PT. Baseline: 75% on 04/17/24 Goal status: MET   LONG TERM GOALS: Target date: 04/29/24 Patient will improve LEFS score by 9 points to demonstrate improved perceived function while meeting MCID.  Baseline: Goal status: IN PROGRESS 2.  Patient will improve L knee flexion ROM to at least 120 degrees to show improved LE mobility for improve functional transfers and QOL.  Baseline:  Goal status: MET 3.  Patient will improve L knee extension ROM to at least 0 degrees to show improved LE mobility for improve functional transfers and QOL.  Baseline:  Goal status: MET   4.  Patient will improve 30 second chair stand test by at least 3 STS in order to demonstrate improved LE strength and endurance needed for community ambulation.  Baseline: Goal status: IN PROGRESS    PLAN:  PT FREQUENCY: 1-2x/week  PT DURATION: 4 weeks  PLANNED INTERVENTIONS: 97164- PT Re-evaluation, 97110-Therapeutic exercises, 97530- Therapeutic activity, [id number]- Neuromuscular re-education, 97535- Self Care, 02859- Manual therapy, [id number]- Gait training,  [phone]- Electrical stimulation (manual), [id number]- Traction (mechanical), [phone] (1-2 muscles), 20561 (3+ muscles)- Dry Needling, Patient/Family education, Balance training, Stair training, Taping, Joint mobilization, Spinal mobilization, Scar mobilization, Cryotherapy, and Moist heat  PLAN FOR NEXT SESSION:  monitor BP prior to treatment.     10:12 AM, 04/22/24 Greig KATHEE Fuse, PTA/CLT [facility] Ph: [phone]

## 2024-04-22 NOTE — Patient Instructions (Signed)
 Your procedure is scheduled on: 04/25/2024  Report to [facility] Main Entrance at  7:00   AM.  Call this number if you have problems the morning of surgery: [phone]   Remember:  Hold Mounjaro  injection 7 days prior to procedure   Do not Eat or Drink after midnight         No Smoking the morning of surgery  :  Take these medicines the morning of surgery with A SIP OF WATER : Amiodarone , celexa , gabapentin , Keppra , flomax , and oxycodone  if needed   Do not wear jewelry, make-up or nail polish.  Do not wear lotions, powders, or perfumes. You may wear deodorant.  Do not shave 48 hours prior to surgery. Men may shave face and neck.  Do not bring valuables to the hospital.  Contacts, dentures or bridgework may not be worn into surgery.  Leave suitcase in the car. After surgery it may be brought to your room.  For patients admitted to the hospital, checkout time is 11:00 AM the day of discharge.   Patients discharged the day of surgery will not be allowed to drive home. Electrical Cardioversion Electrical cardioversion is the delivery of a jolt of electricity to restore a normal rhythm to the heart. A rhythm that is too fast or is not regular (arrhythmia) keeps the heart from pumping blood well. There is also another type of cardioversion called a chemical (pharmacologic) cardioversion. This is when your health care provider gives you one or more medicines to bring back your regular heart rhythm. Electrical cardioversion is done as a scheduled procedure for arrhythmiasthat are not life-threatening. Electrical cardioversion may also be done in an emergency for sudden life-threatening arrhythmias. Tell a health care provider about: Any allergies you have. All medicines you are taking, including vitamins, herbs, eye drops, creams, and over-the-counter medicines. Any problems you or family members have had with sedatives or anesthesia. Any bleeding problems you have. Any surgeries you have  had, including a pacemaker, defibrillator, or other implanted device. Any medical conditions you have. Whether you are pregnant or may be pregnant. What are the risks? Your provider will talk with you about risks. These include: Allergic reactions to medicines. Irritation to the skin on your chest or back where the sticky pads (electrodes) or paddles were put during electrical cardioversion. A blood clot that breaks free and travels to other parts of your body, such as your brain. Return of a worse abnormal heart rhythm that will need to be treated with medicines, a pacemaker, or an implantable cardioverter defibrillator (ICD). What happens before the procedure? Medicines Your provider may give you: Blood-thinning medicines (anticoagulants) so your blood does not clot as easily. If your provider gives you this medicine, you may need to take it for 4 weeks before the procedure. Medicines to help stabilize your heart rate and rhythm. Ask your provider about: Changing or stopping your regular medicines. These include any diabetes medicines or blood thinners you take. Taking medicines such as aspirin  and ibuprofen. These medicines can thin your blood. Do not take them unless your provider tells you to. Taking over-the-counter medicines, vitamins, herbs, and supplements. General instructions Follow instructions from your provider about what you may eat and drink. Do not put any lotions, powders, or ointments on your chest and back for 24 hours before the procedure. They can cause problems with the electrodes or paddles used to deliver electricity to your heart. Do not wear jewelry as this can interfere with delivering electricity to your  heart. If you will be going home right after the procedure, plan to have a responsible adult: Take you home from the hospital or clinic. You will not be allowed to drive. Care for you for the time you are told. Tests You may have an exam or testing. This may  include: Blood labs. A transesophageal echocardiogram (TEE). What happens during the procedure?     An IV will be inserted into one of your veins. You will be given a sedative. This helps you relax. Electrodes or metal paddles will be placed on your chest. They may be placed in one of these ways: One placed on your right chest, the other on the left ribs. One placed on your chest and the other on your back. An electrical shock will be delivered. The shock briefly stops (resets) your heart rhythm. Your provider will check to see if your heart rhythm is now normal. Some people need only one shock. Some need more to restore a normal heart rhythm. The procedure may vary among providers and hospitals. What happens after the procedure? Your blood pressure, heart rate, breathing rate, and blood oxygen level will be monitored until you leave the hospital or clinic. Your heart rhythm will be watched to make sure it does not change. This information is not intended to replace advice given to you by your health care provider. Make sure you discuss any questions you have with your health care provider. Document Revised: 05/11/2022 Document Reviewed: 05/11/2022 Elsevier Patient Education  2024 Else[employer] Inc.    General Anesthesia, Adult, Care After The following information offers guidance on how to care for yourself after your procedure. Your health care provider may also give you more specific instructions. If you have problems or questions, contact your health care provider. What can I expect after the procedure? After the procedure, it is common for people to: Have pain or discomfort at the IV site. Have nausea or vomiting. Have a sore throat or hoarseness. Have trouble concentrating. Feel cold or chills. Feel weak, sleepy, or tired (fatigue). Have soreness and body aches. These can affect parts of the body that were not involved in surgery. Follow these instructions at home: For the time  period you were told by your health care provider:  Rest. Do not participate in activities where you could fall or become injured. Do not drive or use machinery. Do not drink alcohol . Do not take sleeping pills or medicines that cause drowsiness. Do not make important decisions or sign legal documents. Do not take care of children on your own. General instructions Drink enough fluid to keep your urine pale yellow. If you have sleep apnea, surgery and certain medicines can increase your risk for breathing problems. Follow instructions from your health care provider about wearing your sleep device: Anytime you are sleeping, including during daytime naps. While taking prescription pain medicines, sleeping medicines, or medicines that make you drowsy. Return to your normal activities as told by your health care provider. Ask your health care provider what activities are safe for you. Take over-the-counter and prescription medicines only as told by your health care provider. Do not use any products that contain nicotine or tobacco. These products include cigarettes, chewing tobacco, and vaping devices, such as e-cigarettes. These can delay incision healing after surgery. If you need help quitting, ask your health care provider. Contact a health care provider if: You have nausea or vomiting that does not get better with medicine. You vomit every time you eat or  drink. You have pain that does not get better with medicine. You cannot urinate or have bloody urine. You develop a skin rash. You have a fever. Get help right away if: You have trouble breathing. You have chest pain. You vomit blood. These symptoms may be an emergency. Get help right away. Call 911. Do not wait to see if the symptoms will go away. Do not drive yourself to the hospital. Summary After the procedure, it is common to have a sore throat, hoarseness, nausea, vomiting, or to feel weak, sleepy, or fatigue. For the time  period you were told by your health care provider, do not drive or use machinery. Get help right away if you have difficulty breathing, have chest pain, or vomit blood. These symptoms may be an emergency. This information is not intended to replace advice given to you by your health care provider. Make sure you discuss any questions you have with your health care provider. Document Revised: 12/16/2021 Document Reviewed: 12/16/2021 Elsevier Patient Education  2024 [employer].

## 2024-04-23 ENCOUNTER — Encounter (HOSPITAL_COMMUNITY)
Admission: RE | Admit: 2024-04-23 | Discharge: 2024-04-23 | Disposition: A | Discharge status: Home / Self Care | Source: Ambulatory Visit | Attending: Cardiology | Admitting: Cardiology

## 2024-04-23 ENCOUNTER — Ambulatory Visit: Payer: Self-pay | Admitting: Cardiology

## 2024-04-23 VITALS — BP 123/80 | HR 120 | Resp 18 | Ht 72.0 in | Wt 281.0 lb

## 2024-04-23 DIAGNOSIS — Z01812 Encounter for preprocedural laboratory examination: Secondary | ICD-10-CM | POA: Diagnosis not present

## 2024-04-23 DIAGNOSIS — Z01818 Encounter for other preprocedural examination: Secondary | ICD-10-CM | POA: Diagnosis present

## 2024-04-23 LAB — CBC WITH DIFFERENTIAL/PLATELET
Abs Immature Granulocytes: 0.01 K/uL (ref 0.00–0.07)
Basophils Absolute: 0.1 K/uL (ref 0.0–0.1)
Basophils Relative: 1 %
Eosinophils Absolute: 0.2 K/uL (ref 0.0–0.5)
Eosinophils Relative: 4 %
HCT: 43.6 % (ref 39.0–52.0)
Hemoglobin: 13.8 g/dL (ref 13.0–17.0)
Immature Granulocytes: 0 %
Lymphocytes Relative: 22 %
Lymphs Abs: 1.3 K/uL (ref 0.7–4.0)
MCH: 27.6 pg (ref 26.0–34.0)
MCHC: 31.7 g/dL (ref 30.0–36.0)
MCV: 87.2 fL (ref 80.0–100.0)
Monocytes Absolute: 0.6 K/uL (ref 0.1–1.0)
Monocytes Relative: 9 %
Neutro Abs: 3.7 K/uL (ref 1.7–7.7)
Neutrophils Relative %: 64 %
Platelets: 335 K/uL (ref 150–400)
RBC: 5 MIL/uL (ref 4.22–5.81)
RDW: 16 % — ABNORMAL HIGH (ref 11.5–15.5)
WBC: 5.9 K/uL (ref 4.0–10.5)
nRBC: 0 % (ref 0.0–0.2)

## 2024-04-23 LAB — BASIC METABOLIC PANEL WITH GFR
Anion gap: 10 (ref 5–15)
BUN: 24 mg/dL — ABNORMAL HIGH (ref 8–23)
CO2: 20 mmol/L — ABNORMAL LOW (ref 22–32)
Calcium: 8.8 mg/dL — ABNORMAL LOW (ref 8.9–10.3)
Chloride: 106 mmol/L (ref 98–111)
Creatinine, Ser: 1.18 mg/dL (ref 0.61–1.24)
GFR, Estimated: >60 mL/min (ref >60)
Glucose, Bld: 78 mg/dL (ref 70–99)
Potassium: 4.6 mmol/L (ref 3.5–5.1)
Sodium: 136 mmol/L (ref 135–145)

## 2024-04-24 ENCOUNTER — Ambulatory Visit (HOSPITAL_COMMUNITY)

## 2024-04-24 ENCOUNTER — Other Ambulatory Visit (HOSPITAL_BASED_OUTPATIENT_CLINIC_OR_DEPARTMENT_OTHER): Payer: Self-pay

## 2024-04-24 DIAGNOSIS — R29898 Other symptoms and signs involving the musculoskeletal system: Secondary | ICD-10-CM

## 2024-04-24 DIAGNOSIS — M25662 Stiffness of left knee, not elsewhere classified: Secondary | ICD-10-CM

## 2024-04-24 DIAGNOSIS — Z96652 Presence of left artificial knee joint: Secondary | ICD-10-CM | POA: Diagnosis not present

## 2024-04-24 NOTE — Therapy (Signed)
 OUTPATIENT PHYSICAL THERAPY LOWER EXTREMITY TREATMENT  Patient Name: Seth [name] MRN: [id number] DOB:[birth year], 68 y.o., male Today's Date: 04/24/2024     END OF SESSION:  PT End of Session - 04/24/24 1051     Visit Number 6    Number of Visits 10    Date for PT Re-Evaluation 05/01/24    Authorization Type [employer] PPO    Authorization Time Period cohere approved 10 visits 6/26-7/22; 04/24/24:  Requested extension of dates    Authorization - Visit Number 6    Authorization - Number of Visits 10    Progress Note Due on Visit 10    PT Start Time 1023    PT Stop Time 1050    PT Time Calculation (min) 27 min    Activity Tolerance Patient tolerated treatment well    Behavior During Therapy WFL for tasks assessed/performed              Past Medical History:  Diagnosis Date   Anxiety    Brain bleed (HCC)    Intracerebral bleed - [employer] [facility] 2013   Bronchitis    Depression    Diabetes mellitus without complication (HCC)    History of kidney stones    Hypertension    Osteoarthritis of both knees    Pre-diabetes    Sleep apnea    dx. at one time, then they said no- mild, no cpap   Transient ischemic attack (TIA)    Past Surgical History:  Procedure Laterality Date   ACHILLES TENDON SURGERY Right 1994   BARIATRIC SURGERY     2005   cataracts Bilateral 2008   CHOLECYSTECTOMY     COLONOSCOPY  2015   Outside facility:  medium-sized lipoma in transverse colon.    EXTRACORPOREAL SHOCK WAVE LITHOTRIPSY Left 06/13/2018   Procedure: LEFT EXTRACORPOREAL SHOCK WAVE LITHOTRIPSY (ESWL) WITH MAC;  Surgeon: Watt Rush, MD;  Location: [facility];  Service: Urology;  Laterality: Left;   repair of wrist fx Right    SPINAL CORD STIMULATOR INSERTION N/A 12/01/2020   Procedure: Lumbar spinal cord stimulator placement;  Surgeon: Darlis Deatrice RAMAN, MD;  Location: [facility];  Service: Neurosurgery;  Laterality: N/A;   TONSILLECTOMY     TOTAL KNEE ARTHROPLASTY Left 03/03/2024    Procedure: ARTHROPLASTY, KNEE, TOTAL;  Surgeon: Edna Toribio LABOR, MD;  Location: [facility];  Service: Orthopedics;  Laterality: Left;   Patient Active Problem List   Diagnosis Date Noted   Primary osteoarthritis of left knee 03/03/2024   OSA (obstructive sleep apnea) 05/28/2023   Orthostatic hypotension 02/08/2023   Screening for colorectal cancer 09/02/2020   Knee pain 07/15/2020   Sciatica of right side 04/19/2017   TIA (transient ischemic attack) 04/19/2017   Adjustment disorder with anxious mood 03/23/2016   Opioid type dependence, continuous (HCC) 12/08/2015   Seizure prophylaxis 10/13/2015   Allergic rhinitis 12/17/2014   Morbid obesity (HCC) 05/06/2014   Anxiety 07/17/2012   Depression 07/17/2012   Essential hypertension 07/17/2012   Intraparenchymal hemorrhage of brain (HCC) 07/16/2012    PCP: Shona Rush PEDLAR, MD  REFERRING PROVIDER: Edna Toribio LABOR, MD  REFERRING DIAG: S/P Lt TKR (DOS 03/03/2024) needs to start on 03/06/2024  THERAPY DIAG:  History of total left knee replacement  Weakness of left lower extremity  Decreased range of motion (ROM) of left knee  Rationale for Evaluation and Treatment: Rehabilitation  ONSET DATE:  L TKR June 2nd, 2025  SUBJECTIVE:   SUBJECTIVE STATEMENT: Pt reports feeling  a little dizzy today.  States his Rt knee is giving him a fit.  His Lt knee is doing great. PT has cardiac eversion scheduled for Friday.   Had pre-op prior cardioversion scheduled for Friday, BP was 121/76 mmHg when tested yesterday.  No reports of dizziness today.  Lt knee pain scale 5/10 today.  Does have some Rt knee pain.      PERTINENT HISTORY: A-fib  R knee pain, possible TKR needed  PAIN:  Are you having pain? Yes: NPRS scale: 5/10 Pain location: L knee joint Pain description: Constant throbbing Aggravating factors: Bending Relieving factors: Ice  PRECAUTIONS: None  RED FLAGS: None   WEIGHT BEARING RESTRICTIONS: No  FALLS:  Has  patient fallen in last 6 months? No Near fall last week when he was dizzy  LIVING ENVIRONMENT: Lives with: lives with their family and lives with their son, lives with godson Lives in: House/apartment Stairs: Yes: Internal: 12 steps; on right going up and External: 4 steps; on right going up and on left going up Has following equipment at home: Single point cane  OCCUPATION: Work for [employer], Driving during   PLOF: Independent  PATIENT GOALS: Just more range of movement and for this constant pain to get better  NEXT MD VISIT: July 2025  OBJECTIVE:  Note: Objective measures were completed at Evaluation unless otherwise noted.  DIAGNOSTIC FINDINGS:   PATIENT SURVEYS:  Lower Extremity Functional Score: 45 / 80 = 56.3 %  04/17/24: Lower Extremity Functional Score: 51 / 80 = 63.7 %  COGNITION: Overall cognitive status: Within functional limits for tasks assessed     SENSATION: WFL  EDEMA:  Minimal edema to note in LLE  POSTURE: rounded shoulders and forward head  PALPATION:   LOWER EXTREMITY ROM:  Active ROM Right eval Left eval Left 04/09/2024 Left 04/17/24  Hip flexion      Hip extension      Hip abduction      Hip adduction      Hip internal rotation      Hip external rotation      Knee flexion  100 120 122  Knee extension  -4 0 0  Ankle dorsiflexion      Ankle plantarflexion      Ankle inversion      Ankle eversion       (Blank rows = not tested)  LOWER EXTREMITY MMT:  MMT Right eval Left eval Left 04/17/24  Hip flexion 4 4 4+  Hip extension     Hip abduction     Hip adduction     Hip internal rotation     Hip external rotation     Knee flexion 4+ 4- 4  Knee extension 4+ 4- 4  Ankle dorsiflexion 5 5 5   Ankle plantarflexion     Ankle inversion     Ankle eversion      (Blank rows = not tested)  LOWER EXTREMITY SPECIAL TESTS:   FUNCTIONAL TESTS:  30 seconds chair stand test 2 minute walk test:    30 sec chair stand  test: 12 STS, knee just feels tired 04/01/24:  DGI completed 14/24 without AD  04/17/24: 30 second chair stand test: 11 STS, no UE use test: 305', no AD DGI: 13/24  GAIT: Distance walked: 100 Assistive device utilized: Single point cane Level of assistance: Modified independence Comments: SPC on R, dec weight shift to LLE, dec step lengths, antalgic-like at times  TREATMENT DATE:  04/24/24: Recumbent bike, seat 14, 5', full revolutions Standing: Heel raises 15x incline slope Toe raises 15x decline slope   BP:67/56 HR125 3 minutes rechecked BP 66/46 mmHP HR 120   04/22/24 Bike seat 14, 2 minutes only due to Rt knee pain had to stop BP: 94/69, HR 88bpm Exercise termination; instructions to monitor BP  04/17/24:  Recumbent bike, seat 14, 5', full revolutions Progress note: LEFS LE ROM LE MMT 30 second chair stand test 2 minute walk test  DGI 1. Gait level surface (2) Mild Impairment: Walks 20', uses assistive devices, slower speed, mild gait deviations. 2. Change in gait speed (2) Mild Impairment: Is able to change speed but demonstrates mild gait deviations, or not gait deviations but unable to achieve a significant change in velocity, or uses an assistive device. 3. Gait with horizontal head turns (2) Mild Impairment: Performs head turns smoothly with slight change in gait velocity, i.e., minor disruption to smooth gait path or uses walking aid. 4. Gait with vertical head turns (2) Mild Impairment: Performs head turns smoothly with slight change in gait velocity, i.e., minor disruption to smooth gait path or uses walking aid. 5. Gait and pivot turn (2) Mild Impairment: Pivot turns safely in > 3 seconds and stops with no loss of balance. 6. Step over obstacle (1) Moderate Impairment: Is able to step over box but must stop, then step over.  May require verbal cueing. 7. Step around obstacles (1) Moderate Impairment: Is able to clear cones but must significantly slow, speed to accomplish task, or requires verbal cueing. 8. Stairs (1) Moderate Impairment: Two feet to a stair, must use rail.  TOTAL SCORE: 13 / 24   04/09/2024  -Bike seat 14 x 3'' full revolutions -Standing HS stretch on 12in box 2 x 1' -Standing knee drive 2 x 1' on 87pw box -ROM today 0-120 -BP: 78/58 121 Bpm After 5 minutes with water  and snack -BP: 86/60 117 Bpm  04/01/24 Bike seat 17 full revolutions 4 minutes Standing at //bars  12 step Lt knee flexion 10X10  Lt knee flexion 10X  Lateral step ups 4 2X10 with bil UE Lt leading only  Forward step ups 4 2X10 with bil UE Lt leading only AROM in supine 0-120 degrees Myofasical release to scar tissue perimeter of knee DGI     PATIENT EDUCATION:  Education details: PT evaluation, objective findings, POC, Importance of HEP, Precautions, Clinic policies  Person educated: Patient and friend Education method: Medical illustrator Education comprehension: verbalized understanding and returned demonstration  HOME EXERCISE PROGRAM: Access Code: [email or web address] URL: [email or web address] Date: 03/25/2024 Prepared by: Rosaria Powell-Butler  Exercises - Seated Heel Slide  - 2 x daily - 7 x weekly - 3 sets - 10 reps - Seated Long Arc Quad  - 2 x daily - 7 x weekly - 3 sets - 10 reps - Sit to Stand  - 2 x daily - 7 x weekly - 3 sets - 10 reps - Clamshell  - 2 x daily - 7 x weekly - 3 sets - 10 reps - Standing Hip Extension with Counter Support  - 2 x daily - 7 x weekly - 3 sets - 10 reps - Heel Raises with Counter Support  - 2 x daily - 7 x weekly - 3 sets - 10 reps  ASSESSMENT:  CLINICAL IMPRESSION: Pt with Afib and plans for cardioversion on Friday.  Pt reports he had pre-op with BP yesterday with BP at 121/76  bpm.  Began session with bike for mobility.  Pt able to complete heel/toe  raises and requested rest breaks for fatigue.  Checked BP while sitting with BP too low to continue rehab today.  Pt asymptomatic with no reports of dizziness, just fatigue.  Pt encouraged to check his BP daily and to cancel apt in the future.   OBJECTIVE IMPAIRMENTS: Abnormal gait, decreased activity tolerance, decreased balance, decreased endurance, decreased mobility, difficulty walking, decreased ROM, decreased strength, improper body mechanics, postural dysfunction, and pain.   ACTIVITY LIMITATIONS: lifting, bending, sitting, standing, squatting, stairs, and transfers  PARTICIPATION LIMITATIONS: cleaning, laundry, driving, community activity, and occupation  PERSONAL FACTORS: N/A are also affecting patient's functional outcome.   REHAB POTENTIAL: Good  CLINICAL DECISION MAKING: Stable/uncomplicated  EVALUATION COMPLEXITY: Low   GOALS: Goals reviewed with patient? No  SHORT TERM GOALS: Target date: 04/08/24 Patient will be independent with performance of HEP to demonstrate adequate self management of symptoms.  Baseline: reports everyday compliance on 04/17/24 Goal status: MET  2.   Patient will report at least a 25% improvement with function or pain overall since beginning PT. Baseline: 75% on 04/17/24 Goal status: MET   LONG TERM GOALS: Target date: 04/29/24 Patient will improve LEFS score by 9 points to demonstrate improved perceived function while meeting MCID.  Baseline: Goal status: IN PROGRESS 2.  Patient will improve L knee flexion ROM to at least 120 degrees to show improved LE mobility for improve functional transfers and QOL.  Baseline:  Goal status: MET 3.  Patient will improve L knee extension ROM to at least 0 degrees to show improved LE mobility for improve functional transfers and QOL.  Baseline:  Goal status: MET   4.  Patient will improve 30 second chair stand test by at least 3 STS in order to demonstrate improved LE strength and endurance needed for  community ambulation.  Baseline: Goal status: IN PROGRESS    PLAN:  PT FREQUENCY: 1-2x/week  PT DURATION: 4 weeks  PLANNED INTERVENTIONS: 97164- PT Re-evaluation, 97110-Therapeutic exercises, 97530- Therapeutic activity, [id number]- Neuromuscular re-education, 97535- Self Care, 02859- Manual therapy, [id number]- Gait training, [phone]- Electrical stimulation (manual), [id number]- Traction (mechanical), [phone] (1-2 muscles), 20561 (3+ muscles)- Dry Needling, Patient/Family education, Balance training, Stair training, Taping, Joint mobilization, Spinal mobilization, Scar mobilization, Cryotherapy, and Moist heat  PLAN FOR NEXT SESSION:  monitor BP prior to treatment.  Progress balance and functional strengthening.  Augustin Mclean, LPTA/CLT; CBIS [phone]  12:13 PM, 04/24/24

## 2024-04-25 ENCOUNTER — Encounter (HOSPITAL_COMMUNITY)
Admission: RE | Disposition: A | Discharge status: Home / Self Care | Payer: Self-pay | Source: Home / Self Care | Attending: Cardiology

## 2024-04-25 ENCOUNTER — Other Ambulatory Visit: Payer: Self-pay

## 2024-04-25 ENCOUNTER — Ambulatory Visit (HOSPITAL_COMMUNITY): Payer: Self-pay | Admitting: Anesthesiology

## 2024-04-25 ENCOUNTER — Ambulatory Visit (HOSPITAL_COMMUNITY)
Admission: RE | Admit: 2024-04-25 | Discharge: 2024-04-25 | Disposition: A | Discharge status: Home / Self Care | Attending: Cardiology | Admitting: Cardiology

## 2024-04-25 ENCOUNTER — Encounter (HOSPITAL_COMMUNITY): Payer: Self-pay | Admitting: Cardiology

## 2024-04-25 ENCOUNTER — Encounter (HOSPITAL_COMMUNITY): Payer: Self-pay | Admitting: Anesthesiology

## 2024-04-25 DIAGNOSIS — I951 Orthostatic hypotension: Secondary | ICD-10-CM | POA: Diagnosis not present

## 2024-04-25 DIAGNOSIS — I4892 Unspecified atrial flutter: Secondary | ICD-10-CM | POA: Diagnosis not present

## 2024-04-25 DIAGNOSIS — I4891 Unspecified atrial fibrillation: Secondary | ICD-10-CM

## 2024-04-25 DIAGNOSIS — Z8673 Personal history of transient ischemic attack (TIA), and cerebral infarction without residual deficits: Secondary | ICD-10-CM | POA: Diagnosis not present

## 2024-04-25 DIAGNOSIS — Z7901 Long term (current) use of anticoagulants: Secondary | ICD-10-CM | POA: Diagnosis not present

## 2024-04-25 DIAGNOSIS — G4730 Sleep apnea, unspecified: Secondary | ICD-10-CM | POA: Insufficient documentation

## 2024-04-25 DIAGNOSIS — E782 Mixed hyperlipidemia: Secondary | ICD-10-CM | POA: Diagnosis not present

## 2024-04-25 DIAGNOSIS — I10 Essential (primary) hypertension: Secondary | ICD-10-CM | POA: Insufficient documentation

## 2024-04-25 DIAGNOSIS — Z79899 Other long term (current) drug therapy: Secondary | ICD-10-CM | POA: Insufficient documentation

## 2024-04-25 DIAGNOSIS — I4819 Other persistent atrial fibrillation: Secondary | ICD-10-CM | POA: Diagnosis not present

## 2024-04-25 DIAGNOSIS — E119 Type 2 diabetes mellitus without complications: Secondary | ICD-10-CM | POA: Insufficient documentation

## 2024-04-25 DIAGNOSIS — F418 Other specified anxiety disorders: Secondary | ICD-10-CM | POA: Diagnosis not present

## 2024-04-25 DIAGNOSIS — E66813 Obesity, class 3: Secondary | ICD-10-CM | POA: Diagnosis not present

## 2024-04-25 DIAGNOSIS — G709 Myoneural disorder, unspecified: Secondary | ICD-10-CM | POA: Insufficient documentation

## 2024-04-25 DIAGNOSIS — I77819 Aortic ectasia, unspecified site: Secondary | ICD-10-CM | POA: Diagnosis not present

## 2024-04-25 DIAGNOSIS — Z6838 Body mass index (BMI) 38.0-38.9, adult: Secondary | ICD-10-CM | POA: Insufficient documentation

## 2024-04-25 HISTORY — PX: CARDIOVERSION: SHX1299

## 2024-04-25 SURGERY — CARDIOVERSION
Anesthesia: General

## 2024-04-25 MED ORDER — PROPOFOL 10 MG/ML IV BOLUS
INTRAVENOUS | Status: AC
  Filled 2024-04-25: qty 20

## 2024-04-25 MED ORDER — LACTATED RINGERS IV SOLN: INTRAVENOUS | Status: DC | PRN

## 2024-04-25 MED ORDER — PROPOFOL 10 MG/ML IV BOLUS
INTRAVENOUS | Status: DC | PRN
  Administered 2024-04-25: 90 mg via INTRAVENOUS

## 2024-04-25 NOTE — Anesthesia Procedure Notes (Signed)
 Date/Time: 04/25/2024 9:25 AM  Performed by: Barbarann Verneita RAMAN, CRNAPre-anesthesia Checklist: Patient identified, Emergency Drugs available, Suction available, Timeout performed and Patient being monitored Patient Re-evaluated:Patient Re-evaluated prior to induction Oxygen Delivery Method: Nasal Cannula

## 2024-04-25 NOTE — CV Procedure (Signed)
    Electrical Cardioversion Procedure Note Seth [name] [id number] [birth year]  Procedure: Electrical Cardioversion Indications: Atrial Flutter  Time Out: Verified patient identification, verified procedure,medications/allergies/relevent history reviewed, required imaging and test results available.  Performed  Procedure Details  During this procedure the patient is administered a total of Propofol  90 mg to achieve and maintain moderate conscious sedation.  The patient's heart rate, blood pressure, and oxygen saturation are monitored continuously during the procedure. The period of conscious sedation is 2 minutes, of which I was present face-to-face 100% of this time. Seth Herald, CRNA is an independent, trained observer who assisted in the monitoring of the patient's level of consciousness.     Cardioversion was done with synchronized biphasic defibrillation with AP pads with 150watts.  The patient converted to normal sinus rhythm. The patient tolerated the procedure well   IMPRESSION:  Successful cardioversion of atria flutter    Seth [name] 04/25/2024, 9:03 AM

## 2024-04-25 NOTE — Anesthesia Preprocedure Evaluation (Signed)
 Anesthesia Evaluation  Patient identified by MRN, date of birth, ID band Patient awake    Reviewed: Allergy & Precautions, H&P , NPO status , Patient's Chart, lab work & pertinent test results, reviewed documented beta blocker date and time   Airway Mallampati: II  TM Distance: >3 FB Neck ROM: full    Dental no notable dental hx.    Pulmonary sleep apnea    Pulmonary exam normal breath sounds clear to auscultation       Cardiovascular Exercise Tolerance: Good hypertension, + dysrhythmias Atrial Fibrillation  Rhythm:regular Rate:Tachycardia     Neuro/Psych  PSYCHIATRIC DISORDERS Anxiety Depression    TIA Neuromuscular disease    GI/Hepatic negative GI ROS, Neg liver ROS,,,  Endo/Other  diabetes  Class 3 obesity  Renal/GU negative Renal ROS  negative genitourinary   Musculoskeletal   Abdominal   Peds  Hematology negative hematology ROS (+)   Anesthesia Other Findings   Reproductive/Obstetrics negative OB ROS                              Anesthesia Physical Anesthesia Plan  ASA: 3  Anesthesia Plan: General   Post-op Pain Management:    Induction:   PONV Risk Score and Plan: Propofol  infusion  Airway Management Planned:   Additional Equipment:   Intra-op Plan:   Post-operative Plan:   Informed Consent: I have reviewed the patients History and Physical, chart, labs and discussed the procedure including the risks, benefits and alternatives for the proposed anesthesia with the patient or authorized representative who has indicated his/her understanding and acceptance.     Dental Advisory Given  Plan Discussed with: CRNA  Anesthesia Plan Comments:         Anesthesia Quick Evaluation

## 2024-04-25 NOTE — CV Procedure (Signed)
 Electrical Cardioversion Procedure Note NELVIN TOMB [id number] [birth year]  Procedure: Electrical Cardioversion Indications:  Atrial Fibrillation  Procedure Details Consent: Risks of procedure as well as the alternatives and risks of each were explained to the (patient/caregiver).  Consent for procedure obtained. Time Out: Verified patient identification, verified procedure, site/side was marked, verified correct patient position, special equipment/implants available, medications/allergies/relevent history reviewed, required imaging and test results available.[id number]  Patient placed on cardiac monitor, pulse oximetry, supplemental oxygen as necessary.  Sedation given: propofol  Pacer pads placed anterior and posterior chest.  Cardioverted 1 time(s).  Cardioverted at 150J.  Evaluation Findings: Post procedure EKG shows: NSR Complications: None Patient did tolerate procedure well.   Almarie Shams 04/25/2024, 10:04 AM

## 2024-04-25 NOTE — Interval H&P Note (Signed)
 History and Physical Interval Note:  04/25/2024 9:02 AM  Seth [name]  has presented today for surgery, with the diagnosis of a-fib.  The various methods of treatment have been discussed with the patient and family. After consideration of risks, benefits and other options for treatment, the patient has consented to  Procedure(s): CARDIOVERSION (N/A) as a surgical intervention.  The patient's history has been reviewed, patient examined, no change in status, stable for surgery.  I have reviewed the patient's chart and labs.  Questions were answered to the patient's satisfaction.     Wilbert Bihari

## 2024-04-25 NOTE — Transfer of Care (Signed)
 Immediate Anesthesia Transfer of Care Note  Patient: Seth [name]  Procedure(s) Performed: CARDIOVERSION  Patient Location: PACU  Anesthesia Type:General  Level of Consciousness: drowsy and patient cooperative  Airway & Oxygen Therapy: Patient Spontanous Breathing and Patient connected to nasal cannula oxygen  Post-op Assessment: Report given to RN and Post -op Vital signs reviewed and stable  Post vital signs: Reviewed and stable  Last Vitals:  Vitals Value Taken Time  BP 146/92 04/25/24  0946  Temp 97.7 04/25/24  946  Pulse 82 04/25/24  946  Resp 18 04/25/24  946  SpO2 99 04/25/24  946    Last Pain:  Vitals:   04/25/24 0848  TempSrc: Oral  PainSc: 0-No pain      Patients Stated Pain Goal: 5 (04/25/24 0848)  Complications: No notable events documented.

## 2024-04-26 NOTE — Anesthesia Postprocedure Evaluation (Signed)
 Anesthesia Post Note  Patient: Seth [name]  Procedure(s) Performed: CARDIOVERSION  Patient location during evaluation: Phase II Anesthesia Type: General Level of consciousness: awake Pain management: pain level controlled Vital Signs Assessment: post-procedure vital signs reviewed and stable Respiratory status: spontaneous breathing and respiratory function stable Cardiovascular status: blood pressure returned to baseline and stable Postop Assessment: no headache and no apparent nausea or vomiting Anesthetic complications: no Comments: Late entry   No notable events documented.   Last Vitals:  Vitals:   04/25/24 1015 04/25/24 1033  BP: (!) 141/92 137/84  Pulse: 84 87  Resp: 20   Temp:  (!) 36.4 C  SpO2: 99% 100%    Last Pain:  Vitals:   04/25/24 1033  TempSrc: Oral  PainSc: 0-No pain                 [name]

## 2024-04-27 ENCOUNTER — Encounter (HOSPITAL_COMMUNITY): Payer: Self-pay | Admitting: Cardiology

## 2024-04-29 ENCOUNTER — Ambulatory Visit (HOSPITAL_COMMUNITY)

## 2024-04-29 ENCOUNTER — Encounter (HOSPITAL_COMMUNITY): Payer: Self-pay

## 2024-04-29 DIAGNOSIS — M25662 Stiffness of left knee, not elsewhere classified: Secondary | ICD-10-CM | POA: Diagnosis not present

## 2024-04-29 DIAGNOSIS — R29898 Other symptoms and signs involving the musculoskeletal system: Secondary | ICD-10-CM | POA: Diagnosis not present

## 2024-04-29 DIAGNOSIS — E782 Mixed hyperlipidemia: Secondary | ICD-10-CM | POA: Diagnosis not present

## 2024-04-29 DIAGNOSIS — Z96652 Presence of left artificial knee joint: Secondary | ICD-10-CM | POA: Diagnosis not present

## 2024-04-29 DIAGNOSIS — E118 Type 2 diabetes mellitus with unspecified complications: Secondary | ICD-10-CM | POA: Diagnosis not present

## 2024-04-29 NOTE — Therapy (Signed)
 OUTPATIENT PHYSICAL THERAPY LOWER EXTREMITY TREATMENT  Patient Name: Seth [name] MRN: [id number] DOB:06-Aug-1956, 68 y.o., male Today's Date: 04/29/2024     END OF SESSION:  PT End of Session - 04/29/24 1006     Visit Number 7    Number of Visits 10    Date for PT Re-Evaluation 05/01/24    Authorization Type [employer] PPO    Authorization Time Period cohere approved 10 visits 6/26-7/22; 04/24/24:  Requested extension of dates    Authorization - Visit Number 7    Authorization - Number of Visits 10    Progress Note Due on Visit 10    PT Start Time 1010    PT Stop Time 1050    PT Time Calculation (min) 40 min    Activity Tolerance Patient tolerated treatment well    Behavior During Therapy WFL for tasks assessed/performed               Past Medical History:  Diagnosis Date   Anxiety    Brain bleed (HCC)    Intracerebral bleed - [employer] 2013   Bronchitis    Depression    Diabetes mellitus without complication (HCC)    History of kidney stones    Hypertension    Osteoarthritis of both knees    Pre-diabetes    Sleep apnea    dx. at one time, then they said no- mild, no cpap   Transient ischemic attack (TIA)    Past Surgical History:  Procedure Laterality Date   ACHILLES TENDON SURGERY Right 1994   BARIATRIC SURGERY     2005   CARDIOVERSION N/A 04/25/2024   Procedure: CARDIOVERSION;  Surgeon: Shlomo Wilbert SAUNDERS, MD;  Location: [facility];  Service: Cardiovascular;  Laterality: N/A;   cataracts Bilateral 2008   CHOLECYSTECTOMY     COLONOSCOPY  2015   Outside facility:  medium-sized lipoma in transverse colon.    EXTRACORPOREAL SHOCK WAVE LITHOTRIPSY Left 06/13/2018   Procedure: LEFT EXTRACORPOREAL SHOCK WAVE LITHOTRIPSY (ESWL) WITH MAC;  Surgeon: Watt Rush, MD;  Location: [facility];  Service: Urology;  Laterality: Left;   repair of wrist fx Right    SPINAL CORD STIMULATOR INSERTION N/A 12/01/2020   Procedure: Lumbar spinal cord stimulator  placement;  Surgeon: Darlis Deatrice RAMAN, MD;  Location: [facility] OR;  Service: Neurosurgery;  Laterality: N/A;   TONSILLECTOMY     TOTAL KNEE ARTHROPLASTY Left 03/03/2024   Procedure: ARTHROPLASTY, KNEE, TOTAL;  Surgeon: Edna Toribio LABOR, MD;  Location: [facility];  Service: Orthopedics;  Laterality: Left;   Patient Active Problem List   Diagnosis Date Noted   Atrial flutter (HCC) 04/25/2024   Primary osteoarthritis of left knee 03/03/2024   OSA (obstructive sleep apnea) 05/28/2023   Orthostatic hypotension 02/08/2023   Screening for colorectal cancer 09/02/2020   Knee pain 07/15/2020   Sciatica of right side 04/19/2017   TIA (transient ischemic attack) 04/19/2017   Adjustment disorder with anxious mood 03/23/2016   Opioid type dependence, continuous (HCC) 12/08/2015   Seizure prophylaxis 10/13/2015   Allergic rhinitis 12/17/2014   Morbid obesity (HCC) 05/06/2014   Anxiety 07/17/2012   Depression 07/17/2012   Essential hypertension 07/17/2012   Intraparenchymal hemorrhage of brain (HCC) 07/16/2012    PCP: Shona Rush PEDLAR, MD  REFERRING PROVIDER: Edna Toribio LABOR, MD  REFERRING DIAG: S/P Lt TKR (DOS 03/03/2024) needs to start on 03/06/2024  THERAPY DIAG:  History of total left knee replacement  Weakness of left lower extremity  Decreased  range of motion (ROM) of left knee  Rationale for Evaluation and Treatment: Rehabilitation  ONSET DATE:  L TKR June 2nd, 2025  SUBJECTIVE:   SUBJECTIVE STATEMENT: Pt states he has stopped taking one medication due to reading it could be causing his dizziness. Pt states he feels he has gotten much better since the start of therapy.    Had pre-op prior cardioversion scheduled for Friday, BP was 121/76 mmHg when tested yesterday.  No reports of dizziness today.  Lt knee pain scale 5/10 today.  Does have some Rt knee pain.      PERTINENT HISTORY: A-fib  R knee pain, possible TKR needed  PAIN:  Are you having pain? Yes: NPRS scale: 5/10 Pain  location: L knee joint Pain description: Constant throbbing Aggravating factors: Bending Relieving factors: Ice  PRECAUTIONS: None  RED FLAGS: None   WEIGHT BEARING RESTRICTIONS: No  FALLS:  Has patient fallen in last 6 months? No Near fall last week when he was dizzy  LIVING ENVIRONMENT: Lives with: lives with their family and lives with their son, lives with godson Lives in: House/apartment Stairs: Yes: Internal: 12 steps; on right going up and External: 4 steps; on right going up and on left going up Has following equipment at home: Single point cane  OCCUPATION: Work for [employer], Driving during   PLOF: Independent  PATIENT GOALS: Just more range of movement and for this constant pain to get better  NEXT MD VISIT: July 2025  OBJECTIVE:  Note: Objective measures were completed at Evaluation unless otherwise noted.  DIAGNOSTIC FINDINGS:   PATIENT SURVEYS:  Lower Extremity Functional Score: 45 / 80 = 56.3 %  04/17/24: Lower Extremity Functional Score: 51 / 80 = 63.7 %  COGNITION: Overall cognitive status: Within functional limits for tasks assessed     SENSATION: WFL  EDEMA:  Minimal edema to note in LLE  POSTURE: rounded shoulders and forward head  PALPATION:   LOWER EXTREMITY ROM:  Active ROM Right eval Left eval Left 04/09/2024 Left 04/17/24  Hip flexion      Hip extension      Hip abduction      Hip adduction      Hip internal rotation      Hip external rotation      Knee flexion  100 120 122  Knee extension  -4 0 0  Ankle dorsiflexion      Ankle plantarflexion      Ankle inversion      Ankle eversion       (Blank rows = not tested)  LOWER EXTREMITY MMT:  MMT Right eval Left eval Left 04/17/24  Hip flexion 4 4 4+  Hip extension     Hip abduction     Hip adduction     Hip internal rotation     Hip external rotation     Knee flexion 4+ 4- 4  Knee extension 4+ 4- 4  Ankle dorsiflexion 5 5 5   Ankle  plantarflexion     Ankle inversion     Ankle eversion      (Blank rows = not tested)  LOWER EXTREMITY SPECIAL TESTS:   FUNCTIONAL TESTS:  30 seconds chair stand test 2 minute walk test:    30 sec chair stand test: 12 STS, knee just feels tired 04/01/24:  DGI completed 14/24 without AD  04/17/24: 30 second chair stand test: 11 STS, no UE use test: 305', no AD DGI: 13/24  GAIT: Distance  walked: 100 Assistive device utilized: Single point cane Level of assistance: Modified independence Comments: SPC on R, dec weight shift to LLE, dec step lengths, antalgic-like at times                                                                                                                                TREATMENT DATE:  04/29/2024  Vitals: BP: 137/88 mm/Hg HR: 93 bpm  Therapeutic Exercise: -Bike 5 minutes, level 3 resistance, seat 13 -Forward lunges, 1 set of 5 reps better performance going into RLE, pt cued for core activation and upright posture   Neuromuscular Re-education: -Aeromat walks, lateral and tandem stepping, 3 laps each variation, pt requires one UE support -Sit to stands with staggered stance on 4 inch step with yellow ball trampoline toss(5 tosses each rep), 2 sets of 10 reps, pt cued for core activation  Therapeutic Activity: -Step up and overs, 8 inch step, pt cued for decreased UE support -Lateral step up and overs, 8 inch step, pt cued for decreased UE support   04/24/24: Recumbent bike, seat 14, 5', full revolutions Standing: Heel raises 15x incline slope Toe raises 15x decline slope   BP:67/56 HR125 3 minutes rechecked BP 66/46 mmHP HR 120   04/22/24 Bike seat 14, 2 minutes only due to Rt knee pain had to stop BP: 94/69, HR 88bpm Exercise termination; instructions to monitor BP  PATIENT EDUCATION:  Education details: PT evaluation, objective findings, POC, Importance of HEP, Precautions, Clinic policies  Person educated: Patient and  friend Education method: Medical illustrator Education comprehension: verbalized understanding and returned demonstration  HOME EXERCISE PROGRAM: Access Code: [email or web address] URL: [email or web address] Date: 03/25/2024 Prepared by: Rosaria Powell-Butler  Exercises - Seated Heel Slide  - 2 x daily - 7 x weekly - 3 sets - 10 reps - Seated Long Arc Quad  - 2 x daily - 7 x weekly - 3 sets - 10 reps - Sit to Stand  - 2 x daily - 7 x weekly - 3 sets - 10 reps - Clamshell  - 2 x daily - 7 x weekly - 3 sets - 10 reps - Standing Hip Extension with Counter Support  - 2 x daily - 7 x weekly - 3 sets - 10 reps - Heel Raises with Counter Support  - 2 x daily - 7 x weekly - 3 sets - 10 reps  ASSESSMENT:  CLINICAL IMPRESSION: Patient continues to demonstrate improved LE strength, improved gait quality and balance. Patient also demonstrates improved endurance with aerobic based exercise during today's session. Patient able to progress dynamic balance and core activation exercises today with STS variations and aeromat walks, good performance with verbal cueing. Pt instructed to call referring provider with regards to altering medication regiment and to keep close check on BP status as this seems to be main cause of his dizziness. Patient to be discharged at this time to HEP due to pt being  please with current level of function and compliance with HEP.   OBJECTIVE IMPAIRMENTS: Abnormal gait, decreased activity tolerance, decreased balance, decreased endurance, decreased mobility, difficulty walking, decreased ROM, decreased strength, improper body mechanics, postural dysfunction, and pain.   ACTIVITY LIMITATIONS: lifting, bending, sitting, standing, squatting, stairs, and transfers  PARTICIPATION LIMITATIONS: cleaning, laundry, driving, community activity, and occupation  PERSONAL FACTORS: N/A are also affecting patient's functional outcome.   REHAB POTENTIAL: Good  CLINICAL DECISION  MAKING: Stable/uncomplicated  EVALUATION COMPLEXITY: Low   GOALS: Goals reviewed with patient? No  SHORT TERM GOALS: Target date: 04/08/24 Patient will be independent with performance of HEP to demonstrate adequate self management of symptoms.  Baseline: reports everyday compliance on 04/17/24 Goal status: MET  2.   Patient will report at least a 25% improvement with function or pain overall since beginning PT. Baseline: 75% on 04/17/24 Goal status: MET   LONG TERM GOALS: Target date: 04/29/24 Patient will improve LEFS score by 9 points to demonstrate improved perceived function while meeting MCID.  Baseline: Goal status: IN PROGRESS 2.  Patient will improve L knee flexion ROM to at least 120 degrees to show improved LE mobility for improve functional transfers and QOL.  Baseline:  Goal status: MET 3.  Patient will improve L knee extension ROM to at least 0 degrees to show improved LE mobility for improve functional transfers and QOL.  Baseline:  Goal status: MET   4.  Patient will improve 30 second chair stand test by at least 3 STS in order to demonstrate improved LE strength and endurance needed for community ambulation.  Baseline: Goal status: IN PROGRESS    PLAN:  PT FREQUENCY: 1-2x/week  PT DURATION: 4 weeks  PLANNED INTERVENTIONS: 97164- PT Re-evaluation, 97110-Therapeutic exercises, 97530- Therapeutic activity, 97112- Neuromuscular re-education, 97535- Self Care, 02859- Manual therapy, [phone]- Gait training, [phone]- Electrical stimulation (manual), [id number]- Traction (mechanical), [phone] (1-2 muscles), 20561 (3+ muscles)- Dry Needling, Patient/Family education, Balance training, Stair training, Taping, Joint mobilization, Spinal mobilization, Scar mobilization, Cryotherapy, and Moist heat  PLAN FOR NEXT SESSION:  Discharged  Lang Ada, PT, DPT [facility] Office: [phone] 5:50 PM, 04/29/24

## 2024-05-05 DIAGNOSIS — I480 Paroxysmal atrial fibrillation: Secondary | ICD-10-CM | POA: Diagnosis not present

## 2024-05-05 DIAGNOSIS — I10 Essential (primary) hypertension: Secondary | ICD-10-CM | POA: Diagnosis not present

## 2024-05-05 DIAGNOSIS — I959 Hypotension, unspecified: Secondary | ICD-10-CM | POA: Diagnosis not present

## 2024-05-05 DIAGNOSIS — G40909 Epilepsy, unspecified, not intractable, without status epilepticus: Secondary | ICD-10-CM | POA: Diagnosis not present

## 2024-05-05 DIAGNOSIS — F411 Generalized anxiety disorder: Secondary | ICD-10-CM | POA: Diagnosis not present

## 2024-05-05 DIAGNOSIS — F32A Depression, unspecified: Secondary | ICD-10-CM | POA: Diagnosis not present

## 2024-05-05 DIAGNOSIS — E782 Mixed hyperlipidemia: Secondary | ICD-10-CM | POA: Diagnosis not present

## 2024-05-05 DIAGNOSIS — I77810 Thoracic aortic ectasia: Secondary | ICD-10-CM | POA: Diagnosis not present

## 2024-05-05 DIAGNOSIS — E118 Type 2 diabetes mellitus with unspecified complications: Secondary | ICD-10-CM | POA: Diagnosis not present

## 2024-05-06 ENCOUNTER — Ambulatory Visit: Payer: Self-pay | Admitting: Nurse Practitioner

## 2024-05-08 ENCOUNTER — Ambulatory Visit: Admitting: Student

## 2024-05-09 ENCOUNTER — Encounter: Payer: Self-pay | Admitting: Student

## 2024-05-09 ENCOUNTER — Ambulatory Visit: Admitting: Nurse Practitioner

## 2024-05-09 ENCOUNTER — Ambulatory Visit: Attending: Student | Admitting: Student

## 2024-05-09 VITALS — BP 110/60 | HR 93 | Resp 97 | Ht 72.0 in | Wt 275.4 lb

## 2024-05-09 DIAGNOSIS — I4892 Unspecified atrial flutter: Secondary | ICD-10-CM | POA: Diagnosis not present

## 2024-05-09 DIAGNOSIS — Z7901 Long term (current) use of anticoagulants: Secondary | ICD-10-CM | POA: Diagnosis not present

## 2024-05-09 DIAGNOSIS — R42 Dizziness and giddiness: Secondary | ICD-10-CM

## 2024-05-09 DIAGNOSIS — I77810 Thoracic aortic ectasia: Secondary | ICD-10-CM

## 2024-05-09 DIAGNOSIS — E782 Mixed hyperlipidemia: Secondary | ICD-10-CM

## 2024-05-09 MED ORDER — AMIODARONE HCL 200 MG PO TABS: 100.0000 mg | ORAL_TABLET | Freq: Every day | ORAL | 2 refills | Status: AC

## 2024-05-09 NOTE — Patient Instructions (Signed)
 Medication Instructions:  Your physician has recommended you make the following change in your medication:   Stop Taking Aspirin   Decrease Amiodarone  to 100 mg Daily and stop on October 25.    *If you need a refill on your cardiac medications before your next appointment, please call your pharmacy*  Lab Work: NONE   If you have labs (blood work) drawn today and your tests are completely normal, you will receive your results only by: MyChart Message (if you have MyChart) OR A paper copy in the mail If you have any lab test that is abnormal or we need to change your treatment, we will call you to review the results.  Testing/Procedures: NONE   Follow-Up: At [facility], you and your health needs are our priority.  As part of our continuing mission to provide you with exceptional heart care, our providers are all part of one team.  This team includes your primary Cardiologist (physician) and Advanced Practice Providers or APPs (Physician Assistants and Nurse Practitioners) who all work together to provide you with the care you need, when you need it.  Your next appointment:    November   Provider:   Laymon Qua, PA-C    We recommend signing up for the patient portal called MyChart.  Sign up information is provided on this After Visit Summary.  MyChart is used to connect with patients for Virtual Visits (Telemedicine).  Patients are able to view lab/test results, encounter notes, upcoming appointments, etc.  Non-urgent messages can be sent to your provider as well.   To learn more about what you can do with MyChart, go to [email or web address].   Other Instructions Thank you for choosing [facility]!

## 2024-05-09 NOTE — Progress Notes (Signed)
 Cardiology Office Note    Date:  05/09/2024  ID:  Seth, [name] May 26, 1956, MRN [id number] Cardiologist: Diannah SHAUNNA Maywood, MD Cardiology APP:  Johnson Seth HERO, PA-C { : History of Present Illness:    Seth [name] is a 68 y.o. male  with past medical history of persistent atrial fibrillation/flutter, orthostatic hypotension, HLD, mild OSA (not on CPAP) and dilatation of the aortic root who presents to the office today for follow-up from his recent cardioversion.  He was last examined by myself in 04/2024 and was still reporting occasional dizziness and episodes of hypotension but BP would improve with fluid intake. He had missed doses of Eliquis  and the importance of uninterrupted anticoagulation for 3 weeks was reviewed, therefore DCCV was going to be scheduled after 04/22/2024. He was unable to be started on AV nodal blocking agents given hypotension and he was continued on Eliquis  for anticoagulation.  He underwent DCCV with Dr. Shlomo on 04/25/2024 and converted back to normal sinus rhythm with 150 J shock.  In talking with the patient today, he denies any recent chest pain or palpitations. Was overall asymptomatic with atrial flutter prior to cardioversion. He is still having some dizziness and feels like this is possibly due to Amiodarone . However, he reports having some dizziness prior to being diagnosed with atrial flutter and even being on Amiodarone . He did reduce his dose to 100 mg daily last week as discussed in MyChart messages to see if this would help with symptoms and does report some improvement. No recent orthopnea, PND or pitting edema. He has completed PT for his recent knee replacement. He does report easy bruising and has previously been on ASA for prevention and we reviewed that this could be stopped since now on Eliquis .  Studies Reviewed:   EKG: EKG is ordered today and demonstrates:   EKG Interpretation Date/Time:  Friday May 09 2024 14:53:18  EDT Ventricular Rate:  94 PR Interval:  224 QRS Duration:  146 QT Interval:  388 QTC Calculation: 485 R Axis:   -14  Text Interpretation: Sinus rhythm with 1st degree A-V block Right bundle branch block When compared with ECG of 25-Apr-2024 09:41, No significant change was found Confirmed by Johnson Seth ([id number]) on 05/09/2024 4:26:42 PM       Echocardiogram: 03/2024 IMPRESSIONS     1. Left ventricular ejection fraction, by estimation, is 55 to 60%. The  left ventricle has normal function. The left ventricle has no regional  wall motion abnormalities. There is mild concentric left ventricular  hypertrophy. Left ventricular diastolic  function could not be evaluated.   2. Right ventricular systolic function is normal. The right ventricular  size is normal. There is normal pulmonary artery systolic pressure.   3. Left atrial size was mild to moderately dilated.   4. The mitral valve is normal in structure. No evidence of mitral valve  regurgitation. No evidence of mitral stenosis.   5. The aortic valve is normal in structure. Aortic valve regurgitation is  not visualized. No aortic stenosis is present.   6. Aortic dilatation noted. There is mild dilatation of the aortic root,  measuring 40 mm. There is mild dilatation of the ascending aorta,  measuring 41 mm.   7. The inferior vena cava is normal in size with greater than 50%  respiratory variability, suggesting right atrial pressure of 3 mmHg.   Risk Assessment/Calculations:    CHA2DS2-VASc Score = 4  This indicates a 4.8% annual risk of stroke. The  patient's score is based upon: CHF History: 0 HTN History: 0 Diabetes History: 1 Stroke History: 2 Vascular Disease History: 0 Age Score: 1 Gender Score: 0    Physical Exam:   VS:  BP 110/60 (BP Location: Right Arm, Cuff Size: Normal)   Pulse 93   Resp (!) 97   Ht 6' (1.829 m)   Wt 275 lb 6.4 oz (124.9 kg)   BMI 37.35 kg/m    Wt Readings from Last 3 Encounters:   05/09/24 275 lb 6.4 oz (124.9 kg)  04/23/24 281 lb (127.5 kg)  04/02/24 281 lb (127.5 kg)     GEN: Well nourished, well developed male appearing in no acute distress NECK: No JVD; No carotid bruits CARDIAC: RRR, no murmurs, rubs, gallops RESPIRATORY:  Clear to auscultation without rales, wheezing or rhonchi  ABDOMEN: Appears non-distended. No obvious abdominal masses. EXTREMITIES: No clubbing or cyanosis. No pitting edema.  Distal pedal pulses are 2+ bilaterally.   Assessment and Plan:   1. Persistent Atrial Flutter/Use of Long-term Anticoagulation - Was initially treated as postoperative atrial fibrillation/flutter but was having arrhythmias prior to surgery by review of prior EKG's. He underwent successful DCCV on 04/25/2024 and EKG today shows normal sinus rhythm with first-degree AV block (also reviewed with Dr. Alvan as wanted to rule out flutter waves). He has not been on AV nodal blocking agents given hypotension and has been on Amiodarone . Will continue low-dose Amiodarone  100 mg daily and could likely stop at the 75-month mark as previously discussed with Dr. Mallipeddi. If he has recurrence, would recommend referral to EP for consideration of ablation. - Continue Eliquis  5 mg twice daily for anticoagulation. Labs in 04/2024 showed his Hgb was at 14.6 and platelets at 356 K. Will stop ASA 81mg  daily since on anticoagulation.   2. Mixed hyperlipidemia - LDL was at 29 in 04/2024. Continue current medical therapy with Atorvastatin  10 mg daily.  3. Dilated aortic root (HCC) - Echo in 03/2024 showed mild dilatation of the aortic root at 40 mm and mild dilatation of the ascending aorta at 41 mm. Plan for follow-up imaging in 1 year.  4. Dizziness - He feels that dizziness is secondary to Amiodarone  but I am not sure that this is the clear culprit given that he reports having dizziness prior to being started on the medication. He does report improvement in symptoms since Amiodarone  was  reduced to 100 mg daily and will likely stop in 07/2024 as discussed above. - If symptoms persist, encouraged him to review with his PCP as he is on several other medications that could be contributing to dizziness (including Mounjaro  and by review of notes from 05/2023 he did have dizziness with this in the past).   Signed, Seth CHRISTELLA Qua, PA-C

## 2024-05-19 ENCOUNTER — Telehealth: Payer: Self-pay | Admitting: Neurology

## 2024-05-19 NOTE — Telephone Encounter (Signed)
 Pt called to get a sooner appt. He was scheduled for march 2026- we had a cancellation for 07/08/24 and was put on wait list. He keeps passing out and he doesn't know why. He is concerned and would like to see Seth [name] and have a MRI possibly.

## 2024-05-19 NOTE — Telephone Encounter (Signed)
Scheduled for 8/28.

## 2024-05-22 DIAGNOSIS — M1712 Unilateral primary osteoarthritis, left knee: Secondary | ICD-10-CM | POA: Diagnosis not present

## 2024-05-23 ENCOUNTER — Encounter (INDEPENDENT_AMBULATORY_CARE_PROVIDER_SITE_OTHER): Payer: Self-pay

## 2024-05-29 ENCOUNTER — Encounter: Payer: Self-pay | Admitting: Neurology

## 2024-05-29 ENCOUNTER — Telehealth: Payer: Self-pay | Admitting: Student

## 2024-05-29 ENCOUNTER — Ambulatory Visit: Admitting: Neurology

## 2024-05-29 VITALS — BP 100/66 | HR 101 | Resp 20 | Ht 72.0 in | Wt 277.0 lb

## 2024-05-29 DIAGNOSIS — G40209 Localization-related (focal) (partial) symptomatic epilepsy and epileptic syndromes with complex partial seizures, not intractable, without status epilepticus: Secondary | ICD-10-CM

## 2024-05-29 DIAGNOSIS — I951 Orthostatic hypotension: Secondary | ICD-10-CM

## 2024-05-29 MED ORDER — LEVETIRACETAM ER 500 MG PO TB24: ORAL_TABLET | ORAL | 3 refills | Status: AC

## 2024-05-29 MED ORDER — MIDODRINE HCL 2.5 MG PO TABS: 2.5000 mg | ORAL_TABLET | Freq: Three times a day (TID) | ORAL | 11 refills | Status: DC

## 2024-05-29 NOTE — Telephone Encounter (Signed)
    Please let the patient know I received a message from his Neurologist in regards to his recent syncopal episodes and they feel that this is due to orthostatic hypotension given his low BP. Blood pressure has been soft at office visits with us  as well. Would recommend starting Midodrine  2.5 mg twice daily to see if this helps with symptoms. If in agreement, would arrange for a nurse visit in 2 weeks for orthostatic vitals as this may need to be further titrated.   Signed, Laymon CHRISTELLA Qua, PA-C 05/29/2024, 3:55 PM

## 2024-05-29 NOTE — Telephone Encounter (Signed)
 Spoke with pt. recommendations given and orders placed.

## 2024-05-29 NOTE — Patient Instructions (Signed)
 Good to see you again.  Discuss orthostatic hypotension with Cardiology  2. Continue Keppra  ER 500mg : take 2 tablets every night  3. Follow-up in 1 year or earlier if needed, call for any changes   Seizure Precautions: 1. If medication has been prescribed for you to prevent seizures, take it exactly as directed.  Do not stop taking the medicine without talking to your doctor first, even if you have not had a seizure in a long time.   2. Avoid activities in which a seizure would cause danger to yourself or to others.  Don't operate dangerous machinery, swim alone, or climb in high or dangerous places, such as on ladders, roofs, or girders.  Do not drive unless your doctor says you may.  3. If you have any warning that you may have a seizure, lay down in a safe place where you can't hurt yourself.    4.  No driving for 6 months from last seizure, as per [place]  state law.   Please refer to the following link on the Epile[employer] of America's website for more information: http://www.epilepsyfoundation.org/answerplace/Social/driving/drivingu.cfm   5.  Maintain good sleep hygiene. Avoid alcohol   6.  Contact your doctor if you have any problems that may be related to the medicine you are taking.  7.  Call 911 and bring the patient back to the ED if:        A.  The seizure lasts longer than 5 minutes.       B.  The patient doesn't awaken shortly after the seizure  C.  The patient has new problems such as difficulty seeing, speaking or moving  D.  The patient was injured during the seizure  E.  The patient has a temperature over 102 F (39C)  F.  The patient vomited and now is having trouble breathing
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
## 2024-05-30 ENCOUNTER — Telehealth: Payer: Self-pay | Admitting: Student

## 2024-05-30 MED ORDER — MIDODRINE HCL 2.5 MG PO TABS: 2.5000 mg | ORAL_TABLET | Freq: Three times a day (TID) | ORAL | 11 refills | Status: DC

## 2024-05-30 NOTE — Telephone Encounter (Signed)
 Pt's medication was sent to pt's pharmacy as requested. Confirmation received.

## 2024-05-30 NOTE — Telephone Encounter (Signed)
*  STAT* If patient is at the pharmacy, call can be transferred to refill team.   1. Which medications need to be refilled? (please list name of each medication and dose if known) midodrine  (PROAMATINE ) 2.5 MG tablet    2. Would you like to learn more about the convenience, safety, & potential cost savings by using the [facility]? No    3. Are you open to using the [facility] (Type [facility]. ). No   4. Which pharmacy/location (including street and city if local pharmacy) is medication to be sent to? [facility] - [place], [place] - [address]     5. Do they need a 30 day or 90 day supply? 30 day supply   Original request got sent in for a 10 day supply; pt is requesting 30 day supply

## 2024-06-04 DIAGNOSIS — Z23 Encounter for immunization: Secondary | ICD-10-CM | POA: Diagnosis not present

## 2024-06-04 DIAGNOSIS — I10 Essential (primary) hypertension: Secondary | ICD-10-CM | POA: Diagnosis not present

## 2024-06-04 DIAGNOSIS — I480 Paroxysmal atrial fibrillation: Secondary | ICD-10-CM | POA: Diagnosis not present

## 2024-06-04 DIAGNOSIS — F32A Depression, unspecified: Secondary | ICD-10-CM | POA: Diagnosis not present

## 2024-06-04 DIAGNOSIS — G894 Chronic pain syndrome: Secondary | ICD-10-CM | POA: Diagnosis not present

## 2024-06-04 DIAGNOSIS — F411 Generalized anxiety disorder: Secondary | ICD-10-CM | POA: Diagnosis not present

## 2024-06-04 DIAGNOSIS — G40909 Epilepsy, unspecified, not intractable, without status epilepticus: Secondary | ICD-10-CM | POA: Diagnosis not present

## 2024-06-04 DIAGNOSIS — I951 Orthostatic hypotension: Secondary | ICD-10-CM | POA: Diagnosis not present

## 2024-06-04 DIAGNOSIS — I77810 Thoracic aortic ectasia: Secondary | ICD-10-CM | POA: Diagnosis not present

## 2024-06-12 ENCOUNTER — Ambulatory Visit: Attending: Internal Medicine

## 2024-06-16 ENCOUNTER — Telehealth: Payer: Self-pay | Admitting: Student

## 2024-06-16 ENCOUNTER — Encounter (HOSPITAL_COMMUNITY): Payer: Self-pay | Admitting: Cardiology

## 2024-06-16 NOTE — Telephone Encounter (Signed)
 Patient calling to r/s Nurse Visit with Seth [name], please advise.

## 2024-06-17 DIAGNOSIS — M79674 Pain in right toe(s): Secondary | ICD-10-CM | POA: Diagnosis not present

## 2024-06-17 DIAGNOSIS — B351 Tinea unguium: Secondary | ICD-10-CM | POA: Diagnosis not present

## 2024-06-17 DIAGNOSIS — M79675 Pain in left toe(s): Secondary | ICD-10-CM | POA: Diagnosis not present

## 2024-06-17 NOTE — Telephone Encounter (Signed)
 Spoke to patient who stated that he inadvertently wrote the wrong date for his nurse visit for orthostatic bp check. Pt agreed to be rescheduled for 9/17 at 4 pm in the [place] office. Patient had no further questions or concerns at this time.

## 2024-06-18 ENCOUNTER — Ambulatory Visit: Attending: Cardiology

## 2024-06-18 DIAGNOSIS — R42 Dizziness and giddiness: Secondary | ICD-10-CM

## 2024-06-18 MED ORDER — MIDODRINE HCL 5 MG PO TABS: 5.0000 mg | ORAL_TABLET | Freq: Three times a day (TID) | ORAL | 3 refills | Status: DC

## 2024-06-18 NOTE — Progress Notes (Signed)
    Talked with the patient at the time of his nurse visit. Will increase Midodrine  to 5 mg 3 times daily given orthostasis and associated dizziness. Encouraged to use compression stockings but he is unsure if he will do this.  Signed, Laymon CHRISTELLA Qua, PA-C 06/18/2024, 4:33 PM

## 2024-06-18 NOTE — Patient Instructions (Signed)
 Medication Instructions:  Your physician has recommended you make the following change in your medication:   -Increase Midodrine  to 5 mg three times daily with meals  *If you need a refill on your cardiac medications before your next appointment, please call your pharmacy*  Lab Work: None If you have labs (blood work) drawn today and your tests are completely normal, you will receive your results only by: MyChart Message (if you have MyChart) OR A paper copy in the mail If you have any lab test that is abnormal or we need to change your treatment, we will call you to review the results.  Testing/Procedures: None  Follow-Up: At [facility], you and your health needs are our priority.  As part of our continuing mission to provide you with exceptional heart care, our providers are all part of one team.  This team includes your primary Cardiologist (physician) and Advanced Practice Providers or APPs (Physician Assistants and Nurse Practitioners) who all work together to provide you with the care you need, when you need it.  Your next appointment:   Keep all scheduled appointments.  We recommend signing up for the patient portal called MyChart.  Sign up information is provided on this After Visit Summary.  MyChart is used to connect with patients for Virtual Visits (Telemedicine).  Patients are able to view lab/test results, encounter notes, upcoming appointments, etc.  Non-urgent messages can be sent to your provider as well.   To learn more about what you can do with MyChart, go to [email or web address].   Other Instructions

## 2024-06-18 NOTE — Addendum Note (Signed)
 Addended by: KENETH ROSINA BROCKS on: 06/18/2024 04:22 PM   Modules accepted: Orders

## 2024-06-18 NOTE — Progress Notes (Signed)
 Patient presents today for nurse visit- orthostatic bp per B. Strader, PA  Patient was started on Midodrine  2.5 mg tablets TID with meals on 05/30/24 by provider. Patient stated that since starting medication he has felt a little bit better.   Patient stated that he continues to feel dizzy every other day. Pt stated he felt like the room was spinning while he was at lunch today with friends.   Orthostatic bp obtained. Will route to provider for review.    Lying- 108/75 hr- 103 Sitting- 88/61 hr- 105 Standing- 91/61 hr- 105

## 2024-06-27 ENCOUNTER — Other Ambulatory Visit (HOSPITAL_BASED_OUTPATIENT_CLINIC_OR_DEPARTMENT_OTHER): Payer: Self-pay

## 2024-06-27 ENCOUNTER — Other Ambulatory Visit: Payer: Self-pay

## 2024-07-01 ENCOUNTER — Other Ambulatory Visit (HOSPITAL_BASED_OUTPATIENT_CLINIC_OR_DEPARTMENT_OTHER): Payer: Self-pay

## 2024-07-03 ENCOUNTER — Other Ambulatory Visit (HOSPITAL_BASED_OUTPATIENT_CLINIC_OR_DEPARTMENT_OTHER): Payer: Self-pay

## 2024-07-03 MED ORDER — LIDOCAINE 5 % EX PTCH
1.0000 | MEDICATED_PATCH | Freq: Three times a day (TID) | CUTANEOUS | 0 refills | Status: AC
  Filled 2024-07-03: qty 15, 15d supply, fill #0

## 2024-07-08 ENCOUNTER — Ambulatory Visit: Admitting: Neurology

## 2024-07-08 ENCOUNTER — Other Ambulatory Visit (HOSPITAL_BASED_OUTPATIENT_CLINIC_OR_DEPARTMENT_OTHER): Payer: Self-pay

## 2024-07-09 ENCOUNTER — Ambulatory Visit: Attending: Internal Medicine

## 2024-07-09 DIAGNOSIS — R42 Dizziness and giddiness: Secondary | ICD-10-CM

## 2024-07-09 DIAGNOSIS — I951 Orthostatic hypotension: Secondary | ICD-10-CM | POA: Diagnosis not present

## 2024-07-09 MED ORDER — MIDODRINE HCL 10 MG PO TABS: 10.0000 mg | ORAL_TABLET | Freq: Three times a day (TID) | ORAL | 3 refills | Status: AC

## 2024-07-09 NOTE — Progress Notes (Signed)
   Given significant orthostatic hypotension with BP dropping from 118/79 with lying down to 75/55 with sitting and 71/51 with standing, did recommend increasing Midodrine  to 10 mg 3 times daily. Continue with increased fluid consumption.  Signed, Laymon CHRISTELLA Qua, PA-C 07/09/2024, 4:23 PM Pager: [phone]

## 2024-07-09 NOTE — Patient Instructions (Signed)
 Medication Instructions:  Your physician has recommended you make the following change in your medication:   -Increase Midodrine  to 10 mg three time daily   *If you need a refill on your cardiac medications before your next appointment, please call your pharmacy*  Lab Work: None If you have labs (blood work) drawn today and your tests are completely normal, you will receive your results only by: MyChart Message (if you have MyChart) OR A paper copy in the mail If you have any lab test that is abnormal or we need to change your treatment, we will call you to review the results.  Testing/Procedures: None  Follow-Up: At [facility], you and your health needs are our priority.  As part of our continuing mission to provide you with exceptional heart care, our providers are all part of one team.  This team includes your primary Cardiologist (physician) and Advanced Practice Providers or APPs (Physician Assistants and Nurse Practitioners) who all work together to provide you with the care you need, when you need it.  Your next appointment:    Keep follow up as scheduled.   Provider:   Laymon Qua, PA-C    We recommend signing up for the patient portal called MyChart.  Sign up information is provided on this After Visit Summary.  MyChart is used to connect with patients for Virtual Visits (Telemedicine).  Patients are able to view lab/test results, encounter notes, upcoming appointments, etc.  Non-urgent messages can be sent to your provider as well.   To learn more about what you can do with MyChart, go to [email or web address].   Other Instructions

## 2024-07-09 NOTE — Progress Notes (Signed)
 Patient presents today for orthostatic bp's.   Pt stated since provider increased Midodrine , he has felt better, but still dizzy.

## 2024-07-09 NOTE — Addendum Note (Signed)
 Addended by: KENETH KNEE C on: 07/09/2024 04:17 PM   Modules accepted: Orders

## 2024-07-15 DIAGNOSIS — I951 Orthostatic hypotension: Secondary | ICD-10-CM | POA: Diagnosis not present

## 2024-07-18 ENCOUNTER — Ambulatory Visit: Admitting: Neurology

## 2024-07-24 ENCOUNTER — Other Ambulatory Visit (HOSPITAL_BASED_OUTPATIENT_CLINIC_OR_DEPARTMENT_OTHER): Payer: Self-pay

## 2024-08-08 ENCOUNTER — Ambulatory Visit: Admitting: Student

## 2024-08-08 DIAGNOSIS — Z961 Presence of intraocular lens: Secondary | ICD-10-CM | POA: Diagnosis not present

## 2024-08-08 DIAGNOSIS — H10503 Unspecified blepharoconjunctivitis, bilateral: Secondary | ICD-10-CM | POA: Diagnosis not present

## 2024-08-08 DIAGNOSIS — H5212 Myopia, left eye: Secondary | ICD-10-CM | POA: Diagnosis not present

## 2024-08-08 DIAGNOSIS — H5201 Hypermetropia, right eye: Secondary | ICD-10-CM | POA: Diagnosis not present

## 2024-08-11 ENCOUNTER — Encounter: Payer: Self-pay | Admitting: Neurology

## 2024-08-17 NOTE — Progress Notes (Unsigned)
 Cardiology Office Note    Date:  08/19/2024  ID:  LIEUTENANT ABARCA, DOB May 22, 1956, MRN [id number] Cardiologist: Diannah SHAUNNA Maywood, MD Cardiology APP:  Johnson Laymon HERO, PA-C { : History of Present Illness:    Seth [name] is a 68 y.o. male with past medical history of persistent atrial fibrillation/flutter (s/p DCCV in 04/2024), orthostatic hypotension, HLD, mild OSA (not on CPAP) and dilatation of the aortic root who presents to the office today for 75-month follow-up.   He was last examined by myself in 05/2024 and denied any recent chest pain or palpitations at that time. Had recently undergone DCCV and was maintaining normal sinus rhythm. He reported still having episodic dizziness and was concerned that symptoms were due to Amiodarone . He was continued on low-dose Amiodarone  100 mg daily with plans to likely stop once 3 months out from DCCV as previously recommended by Dr. Mallipeddi. He did have episodic dizziness and it was felt that symptoms could possibly be due to Mounjaro  as he previously had dizziness with this in the past.  His Neurologist did reach out in the interim and it was felt that his symptoms were due to orthostatic hypotension and he was started on low-dose Midodrine  2.5 mg twice daily. He was still orthostatic at the time of his nurse visit on 06/18/2024 and this was titrated to 5 mg TID. Was again orthostatic when checked on 07/09/2024 as his BP dropped to 71/51 with standing and Midodrine  was further titrated to 10 mg TID.  In talking with the patient today, he reports his dizziness is variable as he sometimes experiences dizziness throughout the day but at other times does not have symptoms. He has only been taking Midodrine  as needed instead of on a routine basis. He denies any specific dyspnea on exertion, orthopnea, PND, pitting edema, chest pain or palpitations. Says his blood pressure is variable at home but is typically higher than it is today. Recorded at 96/62  during today's office visit. Says his heart rate is typically in the 80's to 90's when checked at home. He remains on Eliquis  for anticoagulation with no reports of melena, hematochezia or hematuria.  Studies Reviewed:   EKG: EKG is ordered today and demonstrates:   EKG Interpretation Date/Time:  Tuesday August 19 2024 10:16:39 EST Ventricular Rate:  105 PR Interval:  208 QRS Duration:  142 QT Interval:  370 QTC Calculation: 489 R Axis:   -6  Text Interpretation: Sinus tachycardia with 1st degree AV block vs. atrial flutter Right bundle branch block Confirmed by Johnson Laymon ([id number]) on 08/19/2024 11:43:37 AM       Echocardiogram: 03/2024 IMPRESSIONS     1. Left ventricular ejection fraction, by estimation, is 55 to 60%. The  left ventricle has normal function. The left ventricle has no regional  wall motion abnormalities. There is mild concentric left ventricular  hypertrophy. Left ventricular diastolic  function could not be evaluated.   2. Right ventricular systolic function is normal. The right ventricular  size is normal. There is normal pulmonary artery systolic pressure.   3. Left atrial size was mild to moderately dilated.   4. The mitral valve is normal in structure. No evidence of mitral valve  regurgitation. No evidence of mitral stenosis.   5. The aortic valve is normal in structure. Aortic valve regurgitation is  not visualized. No aortic stenosis is present.   6. Aortic dilatation noted. There is mild dilatation of the aortic root,  measuring 40 mm. There  is mild dilatation of the ascending aorta,  measuring 41 mm.   7. The inferior vena cava is normal in size with greater than 50%  respiratory variability, suggesting right atrial pressure of 3 mmHg.   Risk Assessment/Calculations:   CHA2DS2-VASc Score = 4  This indicates a 4.8% annual risk of stroke. The patient's score is based upon: CHF History: 0 HTN History: 0 Diabetes History: 1 Stroke  History: 2 Vascular Disease History: 0 Age Score: 1 Gender Score: 0    Physical Exam:   VS:  BP 96/62 (BP Location: Left Arm, Cuff Size: Large)   Pulse (!) 107   Ht 6' (1.829 m)   Wt 271 lb (122.9 kg)   SpO2 98%   BMI 36.75 kg/m    Wt Readings from Last 3 Encounters:  08/19/24 271 lb (122.9 kg)  05/29/24 277 lb (125.6 kg)  05/09/24 275 lb 6.4 oz (124.9 kg)     GEN: Well nourished, well developed male appearing in no acute distress NECK: No JVD; No carotid bruits CARDIAC: Regular rhythm, tachycardiac rate, no murmurs, rubs, gallops RESPIRATORY:  Clear to auscultation without rales, wheezing or rhonchi  ABDOMEN: Appears non-distended. No obvious abdominal masses. EXTREMITIES: No clubbing or cyanosis. No pitting edema.  Distal pedal pulses are 2+ bilaterally.   Assessment and Plan:   1. Atrial flutter, unspecified type (HCC) - He underwent DCCV in 04/2024 and did convert back to normal sinus rhythm. EKG today is difficult to distinguish between sinus tachycardia with first-degree AV block vs. recurrent atrial flutter. I did review with Dr. Waddell who was in the office and he felt this is concerning for atrial flutter. Will plan to obtain a 3-day Zio patch to assess his rhythm and rate control  If found to be consistent atrial flutter, would plan for EP referral for consideration of ablation. Remains on Amiodarone  100 mg daily for now. Not on AV nodal blocking agents given hypotension. - Continue Eliquis  5 mg twice daily for anticoagulation which is the appropriate dose given his age (68 years old), weight (271 lbs) and renal function (creatinine at 1.18 when checked in 04/2024).   2. Orthostatic Hypotension/Dizziness - He still reports episodic dizziness but has not been taking Midodrine  regularly. We reviewed that if he is having worsening symptoms, should take Midodrine  10 mg 3 times daily given persistent orthostasis. Symptoms have been worse since on Mounjaro  and I encouraged him  to hold this for 2 weeks to see if symptoms improve. If no improvement, would consider labs for ruling out adrenal insufficiency given his significant orthostasis.  3. Hyperlipidemia LDL goal <70 - LDL was not 29 when checked in 04/2024. Continue Atorvastatin  10 mg daily.  4. Dilated aortic root - Echocardiogram in 03/2024 showed his aortic root was at 40 mm and ascending aorta at was at 41 mm. Will plan for follow-up imaging in 1 year for reassessment.   Signed, Laymon CHRISTELLA Qua, PA-C

## 2024-08-19 ENCOUNTER — Ambulatory Visit: Attending: Student

## 2024-08-19 ENCOUNTER — Encounter: Payer: Self-pay | Admitting: Student

## 2024-08-19 ENCOUNTER — Ambulatory Visit: Attending: Student | Admitting: Student

## 2024-08-19 VITALS — BP 96/62 | HR 107 | Ht 72.0 in | Wt 271.0 lb

## 2024-08-19 DIAGNOSIS — I4892 Unspecified atrial flutter: Secondary | ICD-10-CM

## 2024-08-19 DIAGNOSIS — I77810 Thoracic aortic ectasia: Secondary | ICD-10-CM | POA: Diagnosis not present

## 2024-08-19 DIAGNOSIS — I951 Orthostatic hypotension: Secondary | ICD-10-CM | POA: Diagnosis not present

## 2024-08-19 DIAGNOSIS — M79675 Pain in left toe(s): Secondary | ICD-10-CM | POA: Diagnosis not present

## 2024-08-19 DIAGNOSIS — R42 Dizziness and giddiness: Secondary | ICD-10-CM | POA: Diagnosis not present

## 2024-08-19 DIAGNOSIS — E785 Hyperlipidemia, unspecified: Secondary | ICD-10-CM | POA: Diagnosis not present

## 2024-08-19 DIAGNOSIS — M79674 Pain in right toe(s): Secondary | ICD-10-CM | POA: Diagnosis not present

## 2024-08-19 DIAGNOSIS — B351 Tinea unguium: Secondary | ICD-10-CM | POA: Diagnosis not present

## 2024-08-19 NOTE — Patient Instructions (Signed)
 Medication Instructions:  HOLD Maunjaro for 2 weeks, please let us  know how you feel after stopping  Labwork: Non today  Testing/Procedures: ZIO XT- Long Term Monitor Instructions   Your physician has requested you wear your ZIO patch monitor___3____days.   This is a single patch monitor.  [employer] supplies one patch monitor per enrollment.  Additional stickers are not available.   Please do not apply patch if you will be having a Nuclear Stress Test, Echocardiogram, Cardiac CT, MRI, or Chest Xray during the time frame you would be wearing the monitor. The patch cannot be worn during these tests.  You cannot remove and re-apply the ZIO XT patch monitor.       Do not shower for the first 24 hours.  You may shower after the first 24 hours.   Press button if you feel a symptom. You will hear a small click.  Record Date, Time and Symptom in the Patient Log Book.   When you are ready to remove patch, follow instructions on last 2 pages of Patient Log Book.  Stick patch monitor onto last page of Patient Log Book.   Place Patient Log Book in [place] box.  Use locking tab on box and tape box closed securely.  The [employer] has [employer] on it.  Please place in mailbox as soon as possible.  Your physician should have your test results approximately 7 days after the monitor has been mailed back to [facility].   Call [facility] Customer Care at [phone] if you have questions regarding your ZIO XT patch monitor.  Call them immediately if you see an orange light blinking on your monitor.   If your monitor falls off in less than 4 days contact our Monitor department at [phone].  If your monitor becomes loose or falls off after 4 days call [employer] at [phone] for suggestions on securing your monitor.    Follow-Up:  3 months B.Strader,PA-C or dr.Mallipeddi  Any Other Special Instructions Will Be Listed Below (If Applicable).  If you need a refill on your  cardiac medications before your next appointment, please call your pharmacy.

## 2024-08-20 ENCOUNTER — Encounter: Payer: Self-pay | Admitting: Neurology

## 2024-08-28 DIAGNOSIS — I4892 Unspecified atrial flutter: Secondary | ICD-10-CM | POA: Diagnosis not present

## 2024-09-03 DIAGNOSIS — E782 Mixed hyperlipidemia: Secondary | ICD-10-CM | POA: Diagnosis not present

## 2024-09-04 ENCOUNTER — Other Ambulatory Visit: Payer: Self-pay | Admitting: Student

## 2024-09-08 ENCOUNTER — Ambulatory Visit: Payer: Self-pay | Admitting: Student

## 2024-09-08 DIAGNOSIS — I4892 Unspecified atrial flutter: Secondary | ICD-10-CM

## 2024-09-16 ENCOUNTER — Other Ambulatory Visit (HOSPITAL_BASED_OUTPATIENT_CLINIC_OR_DEPARTMENT_OTHER): Payer: Self-pay

## 2024-10-01 ENCOUNTER — Other Ambulatory Visit (HOSPITAL_BASED_OUTPATIENT_CLINIC_OR_DEPARTMENT_OTHER): Payer: Self-pay

## 2024-10-16 ENCOUNTER — Ambulatory Visit (INDEPENDENT_AMBULATORY_CARE_PROVIDER_SITE_OTHER): Admitting: Neurology

## 2024-10-16 ENCOUNTER — Encounter: Payer: Self-pay | Admitting: Neurology

## 2024-10-16 VITALS — BP 110/73 | HR 91 | Ht 77.0 in

## 2024-10-16 DIAGNOSIS — G40209 Localization-related (focal) (partial) symptomatic epilepsy and epileptic syndromes with complex partial seizures, not intractable, without status epilepticus: Secondary | ICD-10-CM | POA: Diagnosis not present

## 2024-10-16 NOTE — Patient Instructions (Signed)
 It's always a pleasure to see you. Continue all your medications. Monitor the speech changes, if it becomes a consistent issue, please let me know. Follow-up as scheduled in August, call for any changes.   Seizure Precautions: 1. If medication has been prescribed for you to prevent seizures, take it exactly as directed.  Do not stop taking the medicine without talking to your doctor first, even if you have not had a seizure in a long time.   2. Avoid activities in which a seizure would cause danger to yourself or to others.  Don't operate dangerous machinery, swim alone, or climb in high or dangerous places, such as on ladders, roofs, or girders.  Do not drive unless your doctor says you may.  3. If you have any warning that you may have a seizure, lay down in a safe place where you can't hurt yourself.    4.  No driving for 6 months from last seizure, as per [place]  state law.   Please refer to the following link on the Epilepsy Foundation of America's website for more information: http://www.epilepsyfoundation.org/answerplace/Social/driving/drivingu.cfm   5.  Maintain good sleep hygiene.  6.  Notify your neurology if you are planning pregnancy or if you become pregnant.  7.  Contact your doctor if you have any problems that may be related to the medicine you are taking.  8.  Call 911 and bring the patient back to the ED if:        A.  The seizure lasts longer than 5 minutes.       B.  The patient doesn't awaken shortly after the seizure  C.  The patient has new problems such as difficulty seeing, speaking or moving  D.  The patient was injured during the seizure  E.  The patient has a temperature over 102 F (39C)  F.  The patient vomited and now is having trouble breathing
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
## 2024-11-19 ENCOUNTER — Ambulatory Visit: Admitting: Student

## 2024-12-17 ENCOUNTER — Ambulatory Visit: Admitting: Neurology

## 2025-05-28 ENCOUNTER — Ambulatory Visit: Admitting: Neurology
